# Patient Record
Sex: Female | Born: 1975 | Race: White | Hispanic: No | Smoking: Current every day smoker
Health system: Southern US, Community
[De-identification: ages and names within clinical notes are randomized; demographics above are authoritative.]

## PROBLEM LIST (undated history)

## (undated) DIAGNOSIS — G4730 Sleep apnea, unspecified: Secondary | ICD-10-CM

## (undated) DIAGNOSIS — I499 Cardiac arrhythmia, unspecified: Secondary | ICD-10-CM

## (undated) DIAGNOSIS — G629 Polyneuropathy, unspecified: Secondary | ICD-10-CM

## (undated) DIAGNOSIS — J189 Pneumonia, unspecified organism: Secondary | ICD-10-CM

## (undated) DIAGNOSIS — F319 Bipolar disorder, unspecified: Secondary | ICD-10-CM

## (undated) DIAGNOSIS — E7800 Pure hypercholesterolemia, unspecified: Secondary | ICD-10-CM

## (undated) DIAGNOSIS — F32A Depression, unspecified: Secondary | ICD-10-CM

## (undated) DIAGNOSIS — F329 Major depressive disorder, single episode, unspecified: Secondary | ICD-10-CM

## (undated) DIAGNOSIS — E669 Obesity, unspecified: Secondary | ICD-10-CM

## (undated) DIAGNOSIS — K219 Gastro-esophageal reflux disease without esophagitis: Secondary | ICD-10-CM

## (undated) HISTORY — PX: CARDIAC SURGERY: SHX584

## (undated) HISTORY — PX: APPENDECTOMY: SHX54

## (undated) HISTORY — PX: EYE SURGERY: SHX253

## (undated) HISTORY — PX: TUBAL LIGATION: SHX77

---

## 2003-08-07 ENCOUNTER — Other Ambulatory Visit: Admission: RE | Admit: 2003-08-07 | Discharge: 2003-08-07 | Payer: Self-pay | Admitting: Obstetrics and Gynecology

## 2004-01-30 ENCOUNTER — Inpatient Hospital Stay (HOSPITAL_COMMUNITY): Admission: AD | Admit: 2004-01-30 | Discharge: 2004-01-30 | Payer: Self-pay | Admitting: Obstetrics and Gynecology

## 2007-12-24 ENCOUNTER — Emergency Department (HOSPITAL_COMMUNITY): Admission: EM | Admit: 2007-12-24 | Discharge: 2007-12-25 | Payer: Self-pay | Admitting: Emergency Medicine

## 2008-01-08 ENCOUNTER — Emergency Department (HOSPITAL_COMMUNITY): Admission: EM | Admit: 2008-01-08 | Discharge: 2008-01-09 | Payer: Self-pay | Admitting: Emergency Medicine

## 2008-04-19 ENCOUNTER — Emergency Department (HOSPITAL_COMMUNITY): Admission: EM | Admit: 2008-04-19 | Discharge: 2008-04-20 | Payer: Self-pay | Admitting: Emergency Medicine

## 2008-08-28 ENCOUNTER — Emergency Department (HOSPITAL_COMMUNITY): Admission: EM | Admit: 2008-08-28 | Discharge: 2008-08-29 | Payer: Self-pay | Admitting: Emergency Medicine

## 2008-11-06 ENCOUNTER — Emergency Department (HOSPITAL_COMMUNITY): Admission: EM | Admit: 2008-11-06 | Discharge: 2008-11-06 | Payer: Self-pay | Admitting: Emergency Medicine

## 2008-11-18 ENCOUNTER — Emergency Department (HOSPITAL_COMMUNITY): Admission: EM | Admit: 2008-11-18 | Discharge: 2008-11-19 | Payer: Self-pay | Admitting: Emergency Medicine

## 2008-12-15 ENCOUNTER — Emergency Department (HOSPITAL_COMMUNITY): Admission: EM | Admit: 2008-12-15 | Discharge: 2008-12-16 | Payer: Self-pay | Admitting: Emergency Medicine

## 2008-12-21 ENCOUNTER — Emergency Department (HOSPITAL_COMMUNITY): Admission: EM | Admit: 2008-12-21 | Discharge: 2008-12-21 | Payer: Self-pay | Admitting: Emergency Medicine

## 2008-12-29 ENCOUNTER — Emergency Department (HOSPITAL_COMMUNITY): Admission: EM | Admit: 2008-12-29 | Discharge: 2008-12-29 | Payer: Self-pay | Admitting: Emergency Medicine

## 2009-01-03 ENCOUNTER — Emergency Department (HOSPITAL_COMMUNITY): Admission: EM | Admit: 2009-01-03 | Discharge: 2009-01-04 | Payer: Self-pay | Admitting: Emergency Medicine

## 2009-01-06 ENCOUNTER — Emergency Department (HOSPITAL_COMMUNITY): Admission: EM | Admit: 2009-01-06 | Discharge: 2009-01-07 | Payer: Self-pay | Admitting: Emergency Medicine

## 2009-01-07 ENCOUNTER — Emergency Department (HOSPITAL_COMMUNITY): Admission: EM | Admit: 2009-01-07 | Discharge: 2009-01-07 | Payer: Self-pay | Admitting: Emergency Medicine

## 2009-01-14 ENCOUNTER — Emergency Department (HOSPITAL_COMMUNITY): Admission: EM | Admit: 2009-01-14 | Discharge: 2009-01-14 | Payer: Self-pay | Admitting: Emergency Medicine

## 2009-02-08 ENCOUNTER — Emergency Department (HOSPITAL_COMMUNITY): Admission: EM | Admit: 2009-02-08 | Discharge: 2009-02-08 | Payer: Self-pay | Admitting: Emergency Medicine

## 2009-02-10 ENCOUNTER — Emergency Department (HOSPITAL_COMMUNITY): Admission: EM | Admit: 2009-02-10 | Discharge: 2009-02-10 | Payer: Self-pay | Admitting: Emergency Medicine

## 2009-02-14 ENCOUNTER — Emergency Department (HOSPITAL_COMMUNITY): Admission: EM | Admit: 2009-02-14 | Discharge: 2009-02-14 | Payer: Self-pay | Admitting: Emergency Medicine

## 2009-02-21 ENCOUNTER — Emergency Department (HOSPITAL_COMMUNITY): Admission: EM | Admit: 2009-02-21 | Discharge: 2009-02-21 | Payer: Self-pay | Admitting: Emergency Medicine

## 2009-02-23 ENCOUNTER — Emergency Department (HOSPITAL_COMMUNITY): Admission: EM | Admit: 2009-02-23 | Discharge: 2009-02-24 | Payer: Self-pay | Admitting: Emergency Medicine

## 2009-02-27 ENCOUNTER — Emergency Department (HOSPITAL_COMMUNITY): Admission: EM | Admit: 2009-02-27 | Discharge: 2009-02-28 | Payer: Self-pay | Admitting: Emergency Medicine

## 2009-03-02 ENCOUNTER — Emergency Department (HOSPITAL_COMMUNITY): Admission: EM | Admit: 2009-03-02 | Discharge: 2009-03-03 | Payer: Self-pay | Admitting: Emergency Medicine

## 2009-03-09 ENCOUNTER — Emergency Department (HOSPITAL_COMMUNITY): Admission: EM | Admit: 2009-03-09 | Discharge: 2009-03-10 | Payer: Self-pay | Admitting: Emergency Medicine

## 2009-03-20 ENCOUNTER — Emergency Department (HOSPITAL_COMMUNITY): Admission: EM | Admit: 2009-03-20 | Discharge: 2009-03-21 | Payer: Self-pay | Admitting: Emergency Medicine

## 2009-03-25 ENCOUNTER — Emergency Department (HOSPITAL_COMMUNITY): Admission: EM | Admit: 2009-03-25 | Discharge: 2009-03-26 | Payer: Self-pay | Admitting: Emergency Medicine

## 2009-03-31 ENCOUNTER — Emergency Department (HOSPITAL_COMMUNITY): Admission: EM | Admit: 2009-03-31 | Discharge: 2009-04-01 | Payer: Self-pay | Admitting: Emergency Medicine

## 2009-04-22 ENCOUNTER — Emergency Department (HOSPITAL_COMMUNITY): Admission: EM | Admit: 2009-04-22 | Discharge: 2009-04-23 | Payer: Self-pay | Admitting: Emergency Medicine

## 2009-05-17 ENCOUNTER — Emergency Department (HOSPITAL_COMMUNITY): Admission: EM | Admit: 2009-05-17 | Discharge: 2009-05-18 | Payer: Self-pay | Admitting: Emergency Medicine

## 2009-05-26 ENCOUNTER — Emergency Department (HOSPITAL_COMMUNITY): Admission: EM | Admit: 2009-05-26 | Discharge: 2009-05-27 | Payer: Self-pay | Admitting: Emergency Medicine

## 2009-05-30 ENCOUNTER — Emergency Department (HOSPITAL_COMMUNITY): Admission: EM | Admit: 2009-05-30 | Discharge: 2009-05-30 | Payer: Self-pay | Admitting: Emergency Medicine

## 2009-06-19 ENCOUNTER — Emergency Department (HOSPITAL_COMMUNITY): Admission: EM | Admit: 2009-06-19 | Discharge: 2009-06-20 | Payer: Self-pay | Admitting: Emergency Medicine

## 2009-06-27 ENCOUNTER — Emergency Department (HOSPITAL_COMMUNITY): Admission: EM | Admit: 2009-06-27 | Discharge: 2009-06-27 | Payer: Self-pay | Admitting: Emergency Medicine

## 2009-07-01 ENCOUNTER — Emergency Department (HOSPITAL_COMMUNITY): Admission: EM | Admit: 2009-07-01 | Discharge: 2009-07-01 | Payer: Self-pay | Admitting: Emergency Medicine

## 2009-07-03 ENCOUNTER — Emergency Department (HOSPITAL_COMMUNITY): Admission: EM | Admit: 2009-07-03 | Discharge: 2009-07-04 | Payer: Self-pay | Admitting: Emergency Medicine

## 2009-07-07 ENCOUNTER — Emergency Department (HOSPITAL_COMMUNITY): Admission: EM | Admit: 2009-07-07 | Discharge: 2009-07-07 | Payer: Self-pay | Admitting: Emergency Medicine

## 2009-08-04 ENCOUNTER — Emergency Department (HOSPITAL_COMMUNITY): Admission: EM | Admit: 2009-08-04 | Discharge: 2009-08-05 | Payer: Self-pay | Admitting: Emergency Medicine

## 2009-08-12 ENCOUNTER — Emergency Department (HOSPITAL_COMMUNITY): Admission: EM | Admit: 2009-08-12 | Discharge: 2009-08-13 | Payer: Self-pay | Admitting: Emergency Medicine

## 2009-09-29 ENCOUNTER — Emergency Department (HOSPITAL_COMMUNITY): Admission: EM | Admit: 2009-09-29 | Discharge: 2009-09-30 | Payer: Self-pay | Admitting: Emergency Medicine

## 2009-10-13 ENCOUNTER — Emergency Department (HOSPITAL_COMMUNITY): Admission: EM | Admit: 2009-10-13 | Discharge: 2009-10-14 | Payer: Self-pay | Admitting: Emergency Medicine

## 2009-10-15 ENCOUNTER — Emergency Department (HOSPITAL_COMMUNITY): Admission: EM | Admit: 2009-10-15 | Discharge: 2009-10-15 | Payer: Self-pay | Admitting: Emergency Medicine

## 2009-10-20 ENCOUNTER — Emergency Department (HOSPITAL_COMMUNITY): Admission: EM | Admit: 2009-10-20 | Discharge: 2009-10-21 | Payer: Self-pay | Admitting: Emergency Medicine

## 2009-11-09 ENCOUNTER — Emergency Department (HOSPITAL_COMMUNITY): Admission: EM | Admit: 2009-11-09 | Discharge: 2009-11-09 | Payer: Self-pay | Admitting: Emergency Medicine

## 2009-11-18 ENCOUNTER — Emergency Department (HOSPITAL_COMMUNITY): Admission: EM | Admit: 2009-11-18 | Discharge: 2009-11-19 | Payer: Self-pay | Admitting: Emergency Medicine

## 2010-01-27 ENCOUNTER — Emergency Department (HOSPITAL_COMMUNITY): Admission: EM | Admit: 2010-01-27 | Discharge: 2010-01-28 | Payer: Self-pay | Admitting: Emergency Medicine

## 2010-02-06 ENCOUNTER — Emergency Department (HOSPITAL_COMMUNITY): Admission: EM | Admit: 2010-02-06 | Discharge: 2010-02-06 | Payer: Self-pay | Admitting: Emergency Medicine

## 2010-04-22 ENCOUNTER — Emergency Department (HOSPITAL_COMMUNITY): Admission: EM | Admit: 2010-04-22 | Discharge: 2010-04-23 | Payer: Self-pay | Admitting: Emergency Medicine

## 2010-09-30 ENCOUNTER — Emergency Department (HOSPITAL_COMMUNITY)
Admission: EM | Admit: 2010-09-30 | Discharge: 2010-10-01 | Disposition: A | Discharge status: Home / Self Care | Payer: Medicaid Other | Attending: Emergency Medicine | Admitting: Emergency Medicine

## 2010-09-30 DIAGNOSIS — R112 Nausea with vomiting, unspecified: Secondary | ICD-10-CM | POA: Insufficient documentation

## 2010-09-30 DIAGNOSIS — R42 Dizziness and giddiness: Secondary | ICD-10-CM | POA: Insufficient documentation

## 2010-09-30 DIAGNOSIS — R10819 Abdominal tenderness, unspecified site: Secondary | ICD-10-CM | POA: Insufficient documentation

## 2010-09-30 DIAGNOSIS — J45909 Unspecified asthma, uncomplicated: Secondary | ICD-10-CM | POA: Insufficient documentation

## 2010-09-30 DIAGNOSIS — E669 Obesity, unspecified: Secondary | ICD-10-CM | POA: Insufficient documentation

## 2010-09-30 DIAGNOSIS — R109 Unspecified abdominal pain: Secondary | ICD-10-CM | POA: Insufficient documentation

## 2010-09-30 DIAGNOSIS — F3130 Bipolar disorder, current episode depressed, mild or moderate severity, unspecified: Secondary | ICD-10-CM | POA: Insufficient documentation

## 2010-09-30 DIAGNOSIS — Z9889 Other specified postprocedural states: Secondary | ICD-10-CM | POA: Insufficient documentation

## 2010-09-30 DIAGNOSIS — K219 Gastro-esophageal reflux disease without esophagitis: Secondary | ICD-10-CM | POA: Insufficient documentation

## 2010-09-30 LAB — COMPREHENSIVE METABOLIC PANEL
ALT: 20 U/L (ref 0–35)
AST: 17 U/L (ref 0–37)
CO2: 27 mEq/L (ref 19–32)
Chloride: 108 mEq/L (ref 96–112)
GFR calc Af Amer: >60 mL/min (ref >60)
GFR calc non Af Amer: 56 mL/min — ABNORMAL LOW (ref >60)
Potassium: 3.9 mEq/L (ref 3.5–5.1)
Sodium: 143 mEq/L (ref 135–145)
Total Bilirubin: 0.3 mg/dL (ref 0.3–1.2)

## 2010-09-30 LAB — DIFFERENTIAL
Basophils Absolute: 0.1 10*3/uL (ref 0.0–0.1)
Basophils Relative: 1 % (ref 0–1)
Monocytes Relative: 5 % (ref 3–12)
Neutro Abs: 5.2 10*3/uL (ref 1.7–7.7)
Neutrophils Relative %: 50 % (ref 43–77)

## 2010-09-30 LAB — CBC
Hemoglobin: 13.6 g/dL (ref 12.0–15.0)
MCH: 30.6 pg (ref 26.0–34.0)
RBC: 4.44 MIL/uL (ref 3.87–5.11)
WBC: 10.4 10*3/uL (ref 4.0–10.5)

## 2010-10-01 LAB — URINALYSIS, ROUTINE W REFLEX MICROSCOPIC
Hgb urine dipstick: NEGATIVE
Ketones, ur: NEGATIVE mg/dL
Protein, ur: NEGATIVE mg/dL
Urine Glucose, Fasting: NEGATIVE mg/dL
pH: 6 (ref 5.0–8.0)

## 2010-10-06 ENCOUNTER — Emergency Department (HOSPITAL_COMMUNITY): Payer: Medicaid Other

## 2010-10-06 ENCOUNTER — Emergency Department (HOSPITAL_COMMUNITY)
Admission: EM | Admit: 2010-10-06 | Discharge: 2010-10-07 | Discharge status: Against Medical Advice | Payer: Medicaid Other | Attending: Emergency Medicine | Admitting: Emergency Medicine

## 2010-10-06 DIAGNOSIS — Z79899 Other long term (current) drug therapy: Secondary | ICD-10-CM | POA: Insufficient documentation

## 2010-10-06 DIAGNOSIS — R630 Anorexia: Secondary | ICD-10-CM | POA: Insufficient documentation

## 2010-10-06 DIAGNOSIS — R112 Nausea with vomiting, unspecified: Secondary | ICD-10-CM | POA: Insufficient documentation

## 2010-10-06 DIAGNOSIS — J45909 Unspecified asthma, uncomplicated: Secondary | ICD-10-CM | POA: Insufficient documentation

## 2010-10-06 DIAGNOSIS — K219 Gastro-esophageal reflux disease without esophagitis: Secondary | ICD-10-CM | POA: Insufficient documentation

## 2010-10-06 DIAGNOSIS — F3130 Bipolar disorder, current episode depressed, mild or moderate severity, unspecified: Secondary | ICD-10-CM | POA: Insufficient documentation

## 2010-10-06 DIAGNOSIS — R1011 Right upper quadrant pain: Secondary | ICD-10-CM | POA: Insufficient documentation

## 2010-10-06 LAB — URINALYSIS, ROUTINE W REFLEX MICROSCOPIC
Bilirubin Urine: NEGATIVE
Nitrite: NEGATIVE
Specific Gravity, Urine: 1.023 (ref 1.005–1.030)
pH: 6 (ref 5.0–8.0)

## 2010-10-06 LAB — URINE MICROSCOPIC-ADD ON

## 2010-10-06 LAB — DIFFERENTIAL
Basophils Absolute: 0 10*3/uL (ref 0.0–0.1)
Basophils Relative: 0 % (ref 0–1)
Eosinophils Absolute: 0.7 10*3/uL (ref 0.0–0.7)
Monocytes Absolute: 0.7 10*3/uL (ref 0.1–1.0)
Monocytes Relative: 5 % (ref 3–12)
Neutro Abs: 7.7 10*3/uL (ref 1.7–7.7)

## 2010-10-06 LAB — POCT PREGNANCY, URINE: Preg Test, Ur: NEGATIVE

## 2010-10-06 LAB — CBC
Hemoglobin: 12.5 g/dL (ref 12.0–15.0)
MCH: 29.9 pg (ref 26.0–34.0)
MCHC: 34.2 g/dL (ref 30.0–36.0)
Platelets: 307 10*3/uL (ref 150–400)
RDW: 14.8 % (ref 11.5–15.5)

## 2010-10-07 LAB — COMPREHENSIVE METABOLIC PANEL
ALT: 14 U/L (ref 0–35)
AST: 12 U/L (ref 0–37)
Albumin: 3.6 g/dL (ref 3.5–5.2)
CO2: 28 mEq/L (ref 19–32)
Calcium: 9 mg/dL (ref 8.4–10.5)
Creatinine, Ser: 1.2 mg/dL (ref 0.4–1.2)
GFR calc Af Amer: >60 mL/min (ref >60)
GFR calc non Af Amer: 52 mL/min — ABNORMAL LOW (ref >60)
Sodium: 138 mEq/L (ref 135–145)
Total Protein: 7.1 g/dL (ref 6.0–8.3)

## 2010-10-11 ENCOUNTER — Emergency Department (HOSPITAL_COMMUNITY)
Admission: EM | Admit: 2010-10-11 | Discharge: 2010-10-12 | Disposition: A | Discharge status: Home / Self Care | Payer: Self-pay | Attending: Emergency Medicine | Admitting: Emergency Medicine

## 2010-10-11 ENCOUNTER — Emergency Department: Payer: Self-pay | Admitting: Emergency Medicine

## 2010-10-21 LAB — URINE MICROSCOPIC-ADD ON

## 2010-10-21 LAB — COMPREHENSIVE METABOLIC PANEL
ALT: 16 U/L (ref 0–35)
AST: 12 U/L (ref 0–37)
Calcium: 8.8 mg/dL (ref 8.4–10.5)
Creatinine, Ser: 0.97 mg/dL (ref 0.4–1.2)
GFR calc Af Amer: >60 mL/min (ref >60)
Sodium: 140 mEq/L (ref 135–145)
Total Protein: 7 g/dL (ref 6.0–8.3)

## 2010-10-21 LAB — RAPID URINE DRUG SCREEN, HOSP PERFORMED
Amphetamines: NOT DETECTED
Barbiturates: NOT DETECTED
Benzodiazepines: NOT DETECTED

## 2010-10-21 LAB — URINALYSIS, ROUTINE W REFLEX MICROSCOPIC
Bilirubin Urine: NEGATIVE
Glucose, UA: NEGATIVE mg/dL
Specific Gravity, Urine: 1.021 (ref 1.005–1.030)
Urobilinogen, UA: 0.2 mg/dL (ref 0.0–1.0)
pH: 6 (ref 5.0–8.0)

## 2010-10-21 LAB — DIFFERENTIAL
Eosinophils Absolute: 0.6 10*3/uL (ref 0.0–0.7)
Eosinophils Relative: 4 % (ref 0–5)
Lymphocytes Relative: 24 % (ref 12–46)
Lymphs Abs: 3.6 10*3/uL (ref 0.7–4.0)
Monocytes Relative: 7 % (ref 3–12)

## 2010-10-21 LAB — POCT PREGNANCY, URINE: Preg Test, Ur: NEGATIVE

## 2010-10-21 LAB — CBC
Hemoglobin: 12.1 g/dL (ref 12.0–15.0)
MCHC: 33.2 g/dL (ref 30.0–36.0)
RDW: 15.1 % (ref 11.5–15.5)
WBC: 15.2 10*3/uL — ABNORMAL HIGH (ref 4.0–10.5)

## 2010-10-21 LAB — ETHANOL: Alcohol, Ethyl (B): <5 mg/dL (ref 0–10)

## 2010-10-22 IMAGING — CR DG CHEST 2V
2 series · 2 of 2 positions shown · non-contrast
Comparison: 01/09/2008

CLINICAL DATA: Fever, vomiting

CHEST - 2 VIEW

[w chest pa]
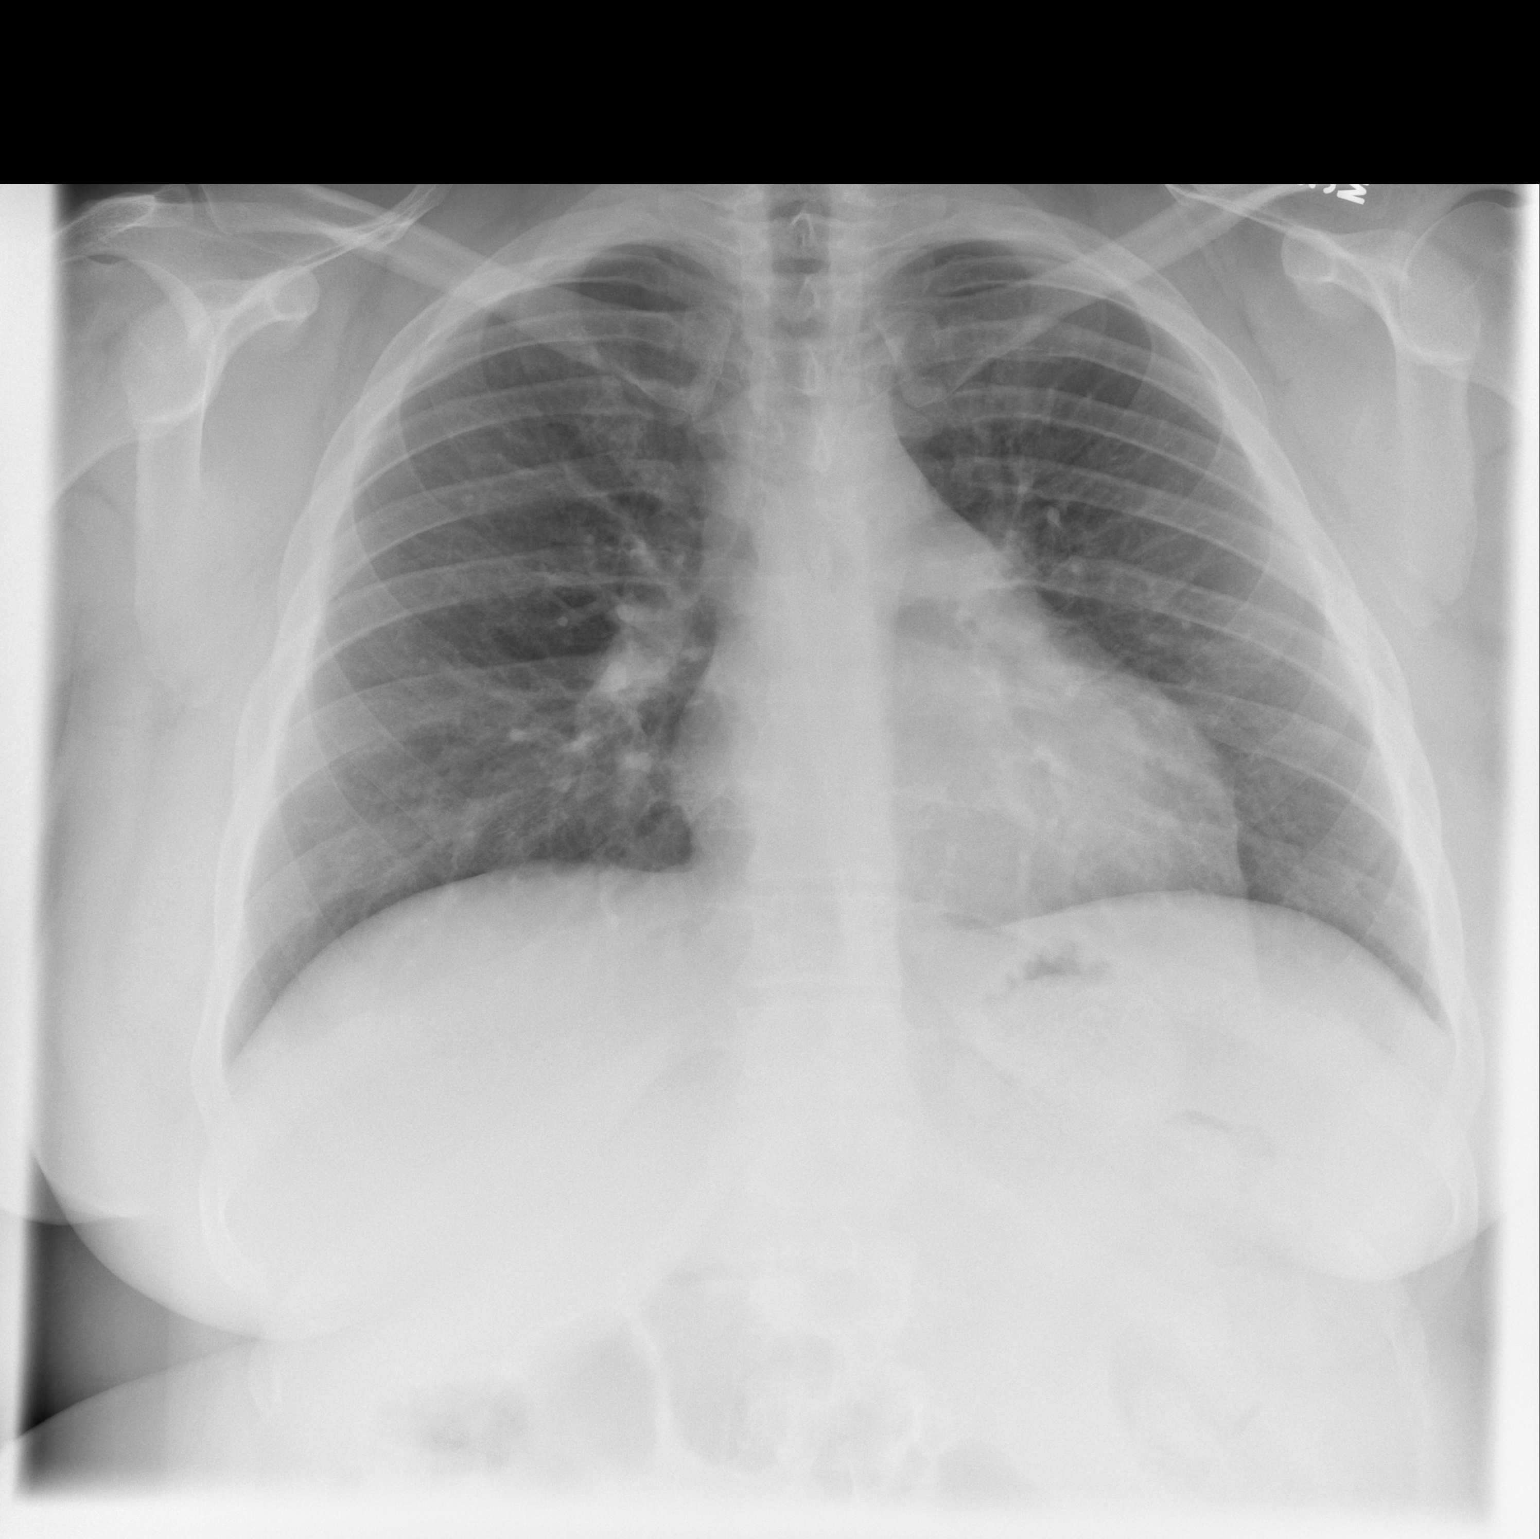

[w chest lat]
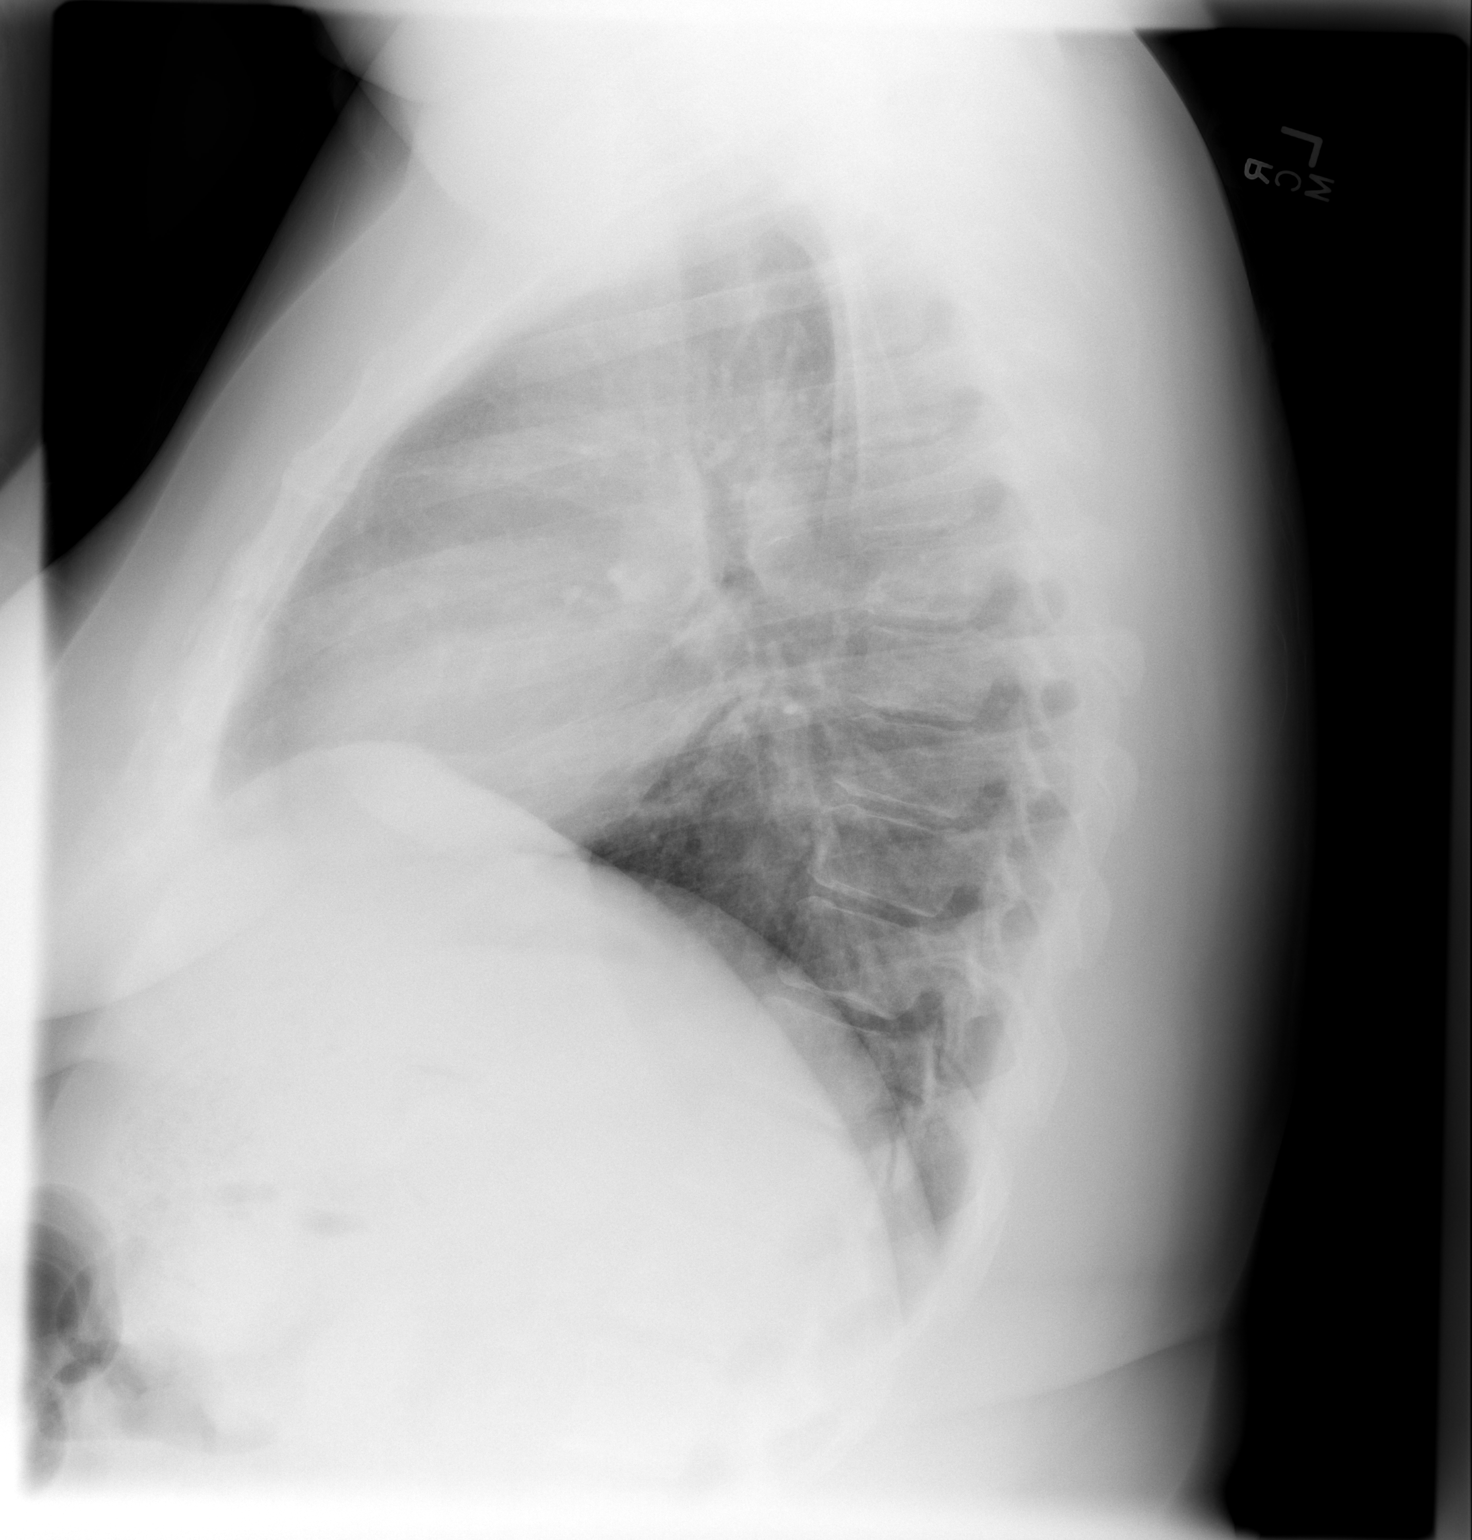

[2 of 2 positions shown; findings below may reference images not displayed]

FINDINGS: The heart size and mediastinal contours are within
normal limits.  Both lungs are clear.  The visualized skeletal
structures are unremarkable.
IMPRESSION: No active cardiopulmonary disease.

## 2010-10-24 LAB — URINE MICROSCOPIC-ADD ON

## 2010-10-24 LAB — CBC
HCT: 34.3 % — ABNORMAL LOW (ref 36.0–46.0)
Hemoglobin: 13 g/dL (ref 12.0–15.0)
Hemoglobin: 11.5 g/dL — ABNORMAL LOW (ref 12.0–15.0)
MCH: 30.7 pg (ref 26.0–34.0)
MCH: 30.2 pg (ref 26.0–34.0)
MCHC: 33.4 g/dL (ref 30.0–36.0)
MCHC: 33.4 g/dL (ref 30.0–36.0)
MCV: 88.7 fL (ref 78.0–100.0)
MCV: 90.4 fL (ref 78.0–100.0)
Platelets: 334 10*3/uL (ref 150–400)
Platelets: 373 10*3/uL (ref 150–400)
Platelets: 366 10*3/uL (ref 150–400)
RDW: 15.8 % — ABNORMAL HIGH (ref 11.5–15.5)
RDW: 16.9 % — ABNORMAL HIGH (ref 11.5–15.5)

## 2010-10-24 LAB — COMPREHENSIVE METABOLIC PANEL
ALT: 16 U/L (ref 0–35)
AST: 16 U/L (ref 0–37)
Albumin: 3.6 g/dL (ref 3.5–5.2)
Albumin: 3.7 g/dL (ref 3.5–5.2)
BUN: 9 mg/dL (ref 6–23)
CO2: 27 mEq/L (ref 19–32)
Calcium: 8.8 mg/dL (ref 8.4–10.5)
Chloride: 107 mEq/L (ref 96–112)
Creatinine, Ser: 1.42 mg/dL — ABNORMAL HIGH (ref 0.4–1.2)
Creatinine, Ser: 1.11 mg/dL (ref 0.4–1.2)
GFR calc Af Amer: 52 mL/min — ABNORMAL LOW (ref >60)
Sodium: 141 mEq/L (ref 135–145)
Total Bilirubin: 0.5 mg/dL (ref 0.3–1.2)
Total Protein: 7.4 g/dL (ref 6.0–8.3)
Total Protein: 7 g/dL (ref 6.0–8.3)

## 2010-10-24 LAB — DIFFERENTIAL
Basophils Absolute: 0.2 10*3/uL — ABNORMAL HIGH (ref 0.0–0.1)
Basophils Absolute: 0.2 10*3/uL — ABNORMAL HIGH (ref 0.0–0.1)
Basophils Relative: 2 % — ABNORMAL HIGH (ref 0–1)
Eosinophils Absolute: 0.5 10*3/uL (ref 0.0–0.7)
Eosinophils Absolute: 0.4 10*3/uL (ref 0.0–0.7)
Eosinophils Relative: 4 % (ref 0–5)
Eosinophils Relative: 3 % (ref 0–5)
Lymphocytes Relative: 25 % (ref 12–46)
Lymphocytes Relative: 26 % (ref 12–46)
Lymphs Abs: 3.6 10*3/uL (ref 0.7–4.0)
Monocytes Absolute: 0.8 10*3/uL (ref 0.1–1.0)
Monocytes Absolute: 0.7 10*3/uL (ref 0.1–1.0)
Monocytes Relative: 5 % (ref 3–12)
Monocytes Relative: 5 % (ref 3–12)
Neutro Abs: 8.8 10*3/uL — ABNORMAL HIGH (ref 1.7–7.7)
Neutrophils Relative %: 66 % (ref 43–77)

## 2010-10-24 LAB — URINALYSIS, ROUTINE W REFLEX MICROSCOPIC
Bilirubin Urine: NEGATIVE
Glucose, UA: NEGATIVE mg/dL
Glucose, UA: NEGATIVE mg/dL
Ketones, ur: NEGATIVE mg/dL
Ketones, ur: NEGATIVE mg/dL
Leukocytes, UA: NEGATIVE
Leukocytes, UA: NEGATIVE
Nitrite: POSITIVE — AB
Nitrite: NEGATIVE
Nitrite: NEGATIVE
Protein, ur: NEGATIVE mg/dL
Specific Gravity, Urine: 1.04 — ABNORMAL HIGH (ref 1.005–1.030)
Specific Gravity, Urine: 1.025 (ref 1.005–1.030)
pH: 6 (ref 5.0–8.0)
pH: 6 (ref 5.0–8.0)
pH: 5.5 (ref 5.0–8.0)

## 2010-10-24 LAB — POCT I-STAT, CHEM 8
BUN: 12 mg/dL (ref 6–23)
Chloride: 106 mEq/L (ref 96–112)
Creatinine, Ser: 0.8 mg/dL (ref 0.4–1.2)
Creatinine, Ser: 1.2 mg/dL (ref 0.4–1.2)
HCT: 40 % (ref 36.0–46.0)
Hemoglobin: 13.6 g/dL (ref 12.0–15.0)
Potassium: 3.9 mEq/L (ref 3.5–5.1)
Sodium: 140 mEq/L (ref 135–145)
Sodium: 140 mEq/L (ref 135–145)

## 2010-10-24 LAB — POCT PREGNANCY, URINE
Preg Test, Ur: NEGATIVE
Preg Test, Ur: NEGATIVE

## 2010-10-24 LAB — LIPASE, BLOOD: Lipase: 25 U/L (ref 11–59)

## 2010-10-27 LAB — COMPREHENSIVE METABOLIC PANEL
Alkaline Phosphatase: 89 U/L (ref 39–117)
BUN: 11 mg/dL (ref 6–23)
BUN: 11 mg/dL (ref 6–23)
CO2: 24 mEq/L (ref 19–32)
Calcium: 8.9 mg/dL (ref 8.4–10.5)
Calcium: 8.9 mg/dL (ref 8.4–10.5)
Creatinine, Ser: 1.25 mg/dL — ABNORMAL HIGH (ref 0.4–1.2)
GFR calc non Af Amer: 50 mL/min — ABNORMAL LOW (ref >60)
Glucose, Bld: 77 mg/dL (ref 70–99)
Glucose, Bld: 93 mg/dL (ref 70–99)
Potassium: 4 mEq/L (ref 3.5–5.1)
Total Bilirubin: 0.7 mg/dL (ref 0.3–1.2)
Total Protein: 7.5 g/dL (ref 6.0–8.3)

## 2010-10-27 LAB — URINALYSIS, ROUTINE W REFLEX MICROSCOPIC
Bilirubin Urine: NEGATIVE
Ketones, ur: NEGATIVE mg/dL
Ketones, ur: NEGATIVE mg/dL
Leukocytes, UA: NEGATIVE
Leukocytes, UA: NEGATIVE
Nitrite: NEGATIVE
Nitrite: NEGATIVE
Nitrite: NEGATIVE
Protein, ur: NEGATIVE mg/dL
Specific Gravity, Urine: 1.023 (ref 1.005–1.030)
Specific Gravity, Urine: 1.017 (ref 1.005–1.030)
Urobilinogen, UA: 1 mg/dL (ref 0.0–1.0)
Urobilinogen, UA: 1 mg/dL (ref 0.0–1.0)
Urobilinogen, UA: 1 mg/dL (ref 0.0–1.0)
pH: 6 (ref 5.0–8.0)
pH: 7 (ref 5.0–8.0)
pH: 6.5 (ref 5.0–8.0)

## 2010-10-27 LAB — CBC
HCT: 34.3 % — ABNORMAL LOW (ref 36.0–46.0)
HCT: 35.1 % — ABNORMAL LOW (ref 36.0–46.0)
Hemoglobin: 11.8 g/dL — ABNORMAL LOW (ref 12.0–15.0)
Hemoglobin: 11.9 g/dL — ABNORMAL LOW (ref 12.0–15.0)
Hemoglobin: 11.5 g/dL — ABNORMAL LOW (ref 12.0–15.0)
MCHC: 33.8 g/dL (ref 30.0–36.0)
MCHC: 34.4 g/dL (ref 30.0–36.0)
MCHC: 33.6 g/dL (ref 30.0–36.0)
MCV: 88.6 fL (ref 78.0–100.0)
MCV: 88.9 fL (ref 78.0–100.0)
Platelets: 314 K/uL (ref 150–400)
Platelets: 351 10*3/uL (ref 150–400)
RBC: 3.87 MIL/uL (ref 3.87–5.11)
RBC: 3.95 MIL/uL (ref 3.87–5.11)
RDW: 16.1 % — ABNORMAL HIGH (ref 11.5–15.5)
RDW: 16.1 % — ABNORMAL HIGH (ref 11.5–15.5)
WBC: 10.2 K/uL (ref 4.0–10.5)

## 2010-10-27 LAB — URINE MICROSCOPIC-ADD ON

## 2010-10-27 LAB — DIFFERENTIAL
Basophils Absolute: 0.1 10*3/uL (ref 0.0–0.1)
Basophils Absolute: 0.1 10*3/uL (ref 0.0–0.1)
Basophils Relative: 2 % — ABNORMAL HIGH (ref 0–1)
Basophils Relative: 1 % (ref 0–1)
Lymphocytes Relative: 36 % (ref 12–46)
Lymphocytes Relative: 20 % (ref 12–46)
Lymphs Abs: 3.6 10*3/uL (ref 0.7–4.0)
Monocytes Absolute: 0.7 10*3/uL (ref 0.1–1.0)
Monocytes Relative: 5 % (ref 3–12)
Neutro Abs: 6.7 10*3/uL (ref 1.7–7.7)
Neutro Abs: 12.3 10*3/uL — ABNORMAL HIGH (ref 1.7–7.7)
Neutrophils Relative %: 52 % (ref 43–77)
Neutrophils Relative %: 66 % (ref 43–77)
Neutrophils Relative %: 72 % (ref 43–77)

## 2010-10-27 LAB — LIPASE, BLOOD: Lipase: 23 U/L (ref 11–59)

## 2010-10-27 LAB — BASIC METABOLIC PANEL
CO2: 27 mEq/L (ref 19–32)
Calcium: 8.3 mg/dL — ABNORMAL LOW (ref 8.4–10.5)
Creatinine, Ser: 1.07 mg/dL (ref 0.4–1.2)
GFR calc non Af Amer: 60 mL/min — ABNORMAL LOW (ref >60)
Glucose, Bld: 79 mg/dL (ref 70–99)
Sodium: 134 mEq/L — ABNORMAL LOW (ref 135–145)

## 2010-10-27 LAB — PREGNANCY, URINE: Preg Test, Ur: NEGATIVE

## 2010-10-31 LAB — DIFFERENTIAL
Basophils Absolute: 0.1 10*3/uL (ref 0.0–0.1)
Basophils Relative: 1 % (ref 0–1)
Lymphocytes Relative: 40 % (ref 12–46)
Neutro Abs: 4 10*3/uL (ref 1.7–7.7)
Neutrophils Relative %: 48 % (ref 43–77)

## 2010-10-31 LAB — URINALYSIS, ROUTINE W REFLEX MICROSCOPIC
Bilirubin Urine: NEGATIVE
Glucose, UA: NEGATIVE mg/dL
Hgb urine dipstick: NEGATIVE
Leukocytes, UA: NEGATIVE
Nitrite: NEGATIVE
Protein, ur: NEGATIVE mg/dL
Protein, ur: NEGATIVE mg/dL
Specific Gravity, Urine: 1.017 (ref 1.005–1.030)
Urobilinogen, UA: 0.2 mg/dL (ref 0.0–1.0)
Urobilinogen, UA: 0.2 mg/dL (ref 0.0–1.0)

## 2010-10-31 LAB — URINE MICROSCOPIC-ADD ON

## 2010-10-31 LAB — COMPREHENSIVE METABOLIC PANEL
Alkaline Phosphatase: 99 U/L (ref 39–117)
BUN: 9 mg/dL (ref 6–23)
Chloride: 103 mEq/L (ref 96–112)
Creatinine, Ser: 1.28 mg/dL — ABNORMAL HIGH (ref 0.4–1.2)
Glucose, Bld: 97 mg/dL (ref 70–99)
Potassium: 4.2 mEq/L (ref 3.5–5.1)
Total Bilirubin: 0.5 mg/dL (ref 0.3–1.2)

## 2010-10-31 LAB — CK TOTAL AND CKMB (NOT AT ARMC)
CK, MB: 1 ng/mL (ref 0.3–4.0)
Total CK: 113 U/L (ref 7–177)

## 2010-10-31 LAB — POCT CARDIAC MARKERS
CKMB, poc: <1 ng/mL — ABNORMAL LOW (ref 1.0–8.0)
Myoglobin, poc: 74.9 ng/mL (ref 12–200)

## 2010-10-31 LAB — CBC
HCT: 36.7 % (ref 36.0–46.0)
Hemoglobin: 12.4 g/dL (ref 12.0–15.0)
MCV: 88.8 fL (ref 78.0–100.0)
RDW: 15.7 % — ABNORMAL HIGH (ref 11.5–15.5)

## 2010-10-31 LAB — POCT PREGNANCY, URINE: Preg Test, Ur: NEGATIVE

## 2010-10-31 LAB — LIPASE, BLOOD: Lipase: 20 U/L (ref 11–59)

## 2010-11-01 ENCOUNTER — Emergency Department (HOSPITAL_COMMUNITY)
Admission: EM | Admit: 2010-11-01 | Discharge: 2010-11-01 | Disposition: A | Discharge status: Home / Self Care | Payer: Medicaid Other | Attending: Emergency Medicine | Admitting: Emergency Medicine

## 2010-11-01 DIAGNOSIS — G8929 Other chronic pain: Secondary | ICD-10-CM | POA: Insufficient documentation

## 2010-11-01 DIAGNOSIS — Z79899 Other long term (current) drug therapy: Secondary | ICD-10-CM | POA: Insufficient documentation

## 2010-11-01 DIAGNOSIS — J45909 Unspecified asthma, uncomplicated: Secondary | ICD-10-CM | POA: Insufficient documentation

## 2010-11-01 DIAGNOSIS — R630 Anorexia: Secondary | ICD-10-CM | POA: Insufficient documentation

## 2010-11-01 DIAGNOSIS — F3130 Bipolar disorder, current episode depressed, mild or moderate severity, unspecified: Secondary | ICD-10-CM | POA: Insufficient documentation

## 2010-11-01 DIAGNOSIS — R112 Nausea with vomiting, unspecified: Secondary | ICD-10-CM | POA: Insufficient documentation

## 2010-11-01 DIAGNOSIS — R1011 Right upper quadrant pain: Secondary | ICD-10-CM | POA: Insufficient documentation

## 2010-11-01 DIAGNOSIS — K219 Gastro-esophageal reflux disease without esophagitis: Secondary | ICD-10-CM | POA: Insufficient documentation

## 2010-11-01 LAB — DIFFERENTIAL
Basophils Relative: 1 % (ref 0–1)
Eosinophils Absolute: 0.5 10*3/uL (ref 0.0–0.7)
Lymphs Abs: 3.7 10*3/uL (ref 0.7–4.0)
Monocytes Absolute: 0.6 10*3/uL (ref 0.1–1.0)
Monocytes Relative: 5 % (ref 3–12)
Neutrophils Relative %: 59 % (ref 43–77)

## 2010-11-01 LAB — COMPREHENSIVE METABOLIC PANEL
Albumin: 3.6 g/dL (ref 3.5–5.2)
BUN: 10 mg/dL (ref 6–23)
CO2: 26 mEq/L (ref 19–32)
Calcium: 8.8 mg/dL (ref 8.4–10.5)
Chloride: 102 mEq/L (ref 96–112)
Creatinine, Ser: 1.07 mg/dL (ref 0.4–1.2)
GFR calc Af Amer: >60 mL/min (ref >60)
GFR calc non Af Amer: 59 mL/min — ABNORMAL LOW (ref >60)
Total Bilirubin: 0.4 mg/dL (ref 0.3–1.2)

## 2010-11-01 LAB — URINALYSIS, ROUTINE W REFLEX MICROSCOPIC
Bilirubin Urine: NEGATIVE
Glucose, UA: NEGATIVE mg/dL
Hgb urine dipstick: NEGATIVE
Ketones, ur: NEGATIVE mg/dL
Protein, ur: NEGATIVE mg/dL
Urobilinogen, UA: 0.2 mg/dL (ref 0.0–1.0)

## 2010-11-01 LAB — CBC
MCH: 29.6 pg (ref 26.0–34.0)
MCHC: 33.8 g/dL (ref 30.0–36.0)
MCV: 87.6 fL (ref 78.0–100.0)
Platelets: 331 10*3/uL (ref 150–400)
RBC: 4.19 MIL/uL (ref 3.87–5.11)

## 2010-11-08 LAB — DIFFERENTIAL
Basophils Relative: 1 % (ref 0–1)
Lymphocytes Relative: 32 % (ref 12–46)
Lymphs Abs: 4 10*3/uL (ref 0.7–4.0)
Monocytes Relative: 7 % (ref 3–12)
Neutro Abs: 7.2 10*3/uL (ref 1.7–7.7)
Neutrophils Relative %: 57 % (ref 43–77)

## 2010-11-08 LAB — CBC
HCT: 36.1 % (ref 36.0–46.0)
Hemoglobin: 12.2 g/dL (ref 12.0–15.0)
MCHC: 33.8 g/dL (ref 30.0–36.0)
MCV: 90.3 fL (ref 78.0–100.0)
RDW: 15.4 % (ref 11.5–15.5)

## 2010-11-08 LAB — COMPREHENSIVE METABOLIC PANEL
Alkaline Phosphatase: 89 U/L (ref 39–117)
BUN: 13 mg/dL (ref 6–23)
Calcium: 8.8 mg/dL (ref 8.4–10.5)
Creatinine, Ser: 1.08 mg/dL (ref 0.4–1.2)
Glucose, Bld: 78 mg/dL (ref 70–99)
Total Protein: 7 g/dL (ref 6.0–8.3)

## 2010-11-08 LAB — URINALYSIS, ROUTINE W REFLEX MICROSCOPIC
Leukocytes, UA: NEGATIVE
Protein, ur: NEGATIVE mg/dL
Specific Gravity, Urine: 1.031 — ABNORMAL HIGH (ref 1.005–1.030)
Urobilinogen, UA: 1 mg/dL (ref 0.0–1.0)

## 2010-11-08 LAB — URINE MICROSCOPIC-ADD ON

## 2010-11-10 LAB — URINALYSIS, ROUTINE W REFLEX MICROSCOPIC
Bilirubin Urine: NEGATIVE
Bilirubin Urine: NEGATIVE
Bilirubin Urine: NEGATIVE
Glucose, UA: NEGATIVE mg/dL
Glucose, UA: NEGATIVE mg/dL
Ketones, ur: NEGATIVE mg/dL
Ketones, ur: NEGATIVE mg/dL
Ketones, ur: NEGATIVE mg/dL
Leukocytes, UA: NEGATIVE
Leukocytes, UA: NEGATIVE
Nitrite: NEGATIVE
Nitrite: NEGATIVE
Nitrite: NEGATIVE
Protein, ur: NEGATIVE mg/dL
Protein, ur: NEGATIVE mg/dL
Specific Gravity, Urine: 1.011 (ref 1.005–1.030)
Specific Gravity, Urine: 1.022 (ref 1.005–1.030)
Urobilinogen, UA: 0.2 mg/dL (ref 0.0–1.0)
Urobilinogen, UA: 0.2 mg/dL (ref 0.0–1.0)
pH: 6 (ref 5.0–8.0)
pH: 6 (ref 5.0–8.0)

## 2010-11-10 LAB — DIFFERENTIAL
Basophils Absolute: 0.1 10*3/uL (ref 0.0–0.1)
Basophils Relative: 1 % (ref 0–1)
Basophils Relative: 1 % (ref 0–1)
Eosinophils Absolute: 0.5 10*3/uL (ref 0.0–0.7)
Eosinophils Relative: 0 % (ref 0–5)
Lymphocytes Relative: 14 % (ref 12–46)
Monocytes Absolute: 0.4 10*3/uL (ref 0.1–1.0)
Neutrophils Relative %: 56 % (ref 43–77)

## 2010-11-10 LAB — URINE MICROSCOPIC-ADD ON

## 2010-11-10 LAB — COMPREHENSIVE METABOLIC PANEL
ALT: 19 U/L (ref 0–35)
Alkaline Phosphatase: 85 U/L (ref 39–117)
CO2: 28 mEq/L (ref 19–32)
Calcium: 8.7 mg/dL (ref 8.4–10.5)
GFR calc non Af Amer: 54 mL/min — ABNORMAL LOW (ref >60)
Glucose, Bld: 93 mg/dL (ref 70–99)
Potassium: 3.6 mEq/L (ref 3.5–5.1)
Sodium: 138 mEq/L (ref 135–145)

## 2010-11-10 LAB — CBC
HCT: 29.3 % — ABNORMAL LOW (ref 36.0–46.0)
HCT: 36.4 % (ref 36.0–46.0)
Hemoglobin: 10 g/dL — ABNORMAL LOW (ref 12.0–15.0)
Hemoglobin: 12.5 g/dL (ref 12.0–15.0)
MCHC: 34.1 g/dL (ref 30.0–36.0)
MCHC: 34.3 g/dL (ref 30.0–36.0)
RBC: 4.02 MIL/uL (ref 3.87–5.11)
RDW: 15.5 % (ref 11.5–15.5)

## 2010-11-10 LAB — PREGNANCY, URINE: Preg Test, Ur: NEGATIVE

## 2010-11-10 LAB — POCT I-STAT, CHEM 8
BUN: 16 mg/dL (ref 6–23)
Calcium, Ion: 1.1 mmol/L — ABNORMAL LOW (ref 1.12–1.32)
Glucose, Bld: 128 mg/dL — ABNORMAL HIGH (ref 70–99)
TCO2: 22 mmol/L (ref 0–100)

## 2010-11-10 LAB — HEPATIC FUNCTION PANEL
Alkaline Phosphatase: 78 U/L (ref 39–117)
Bilirubin, Direct: <0.1 mg/dL (ref 0.0–0.3)
Total Protein: 7.2 g/dL (ref 6.0–8.3)

## 2010-11-10 LAB — LIPASE, BLOOD: Lipase: 22 U/L (ref 11–59)

## 2010-11-10 LAB — POCT PREGNANCY, URINE
Preg Test, Ur: NEGATIVE
Preg Test, Ur: NEGATIVE

## 2010-11-11 LAB — COMPREHENSIVE METABOLIC PANEL
ALT: 15 U/L (ref 0–35)
AST: 15 U/L (ref 0–37)
CO2: 27 mEq/L (ref 19–32)
Chloride: 105 mEq/L (ref 96–112)
GFR calc Af Amer: >60 mL/min (ref >60)
GFR calc non Af Amer: 58 mL/min — ABNORMAL LOW (ref >60)
Glucose, Bld: 90 mg/dL (ref 70–99)
Sodium: 137 mEq/L (ref 135–145)
Total Bilirubin: 0.3 mg/dL (ref 0.3–1.2)

## 2010-11-11 LAB — POCT I-STAT, CHEM 8
Chloride: 104 mEq/L (ref 96–112)
Glucose, Bld: 91 mg/dL (ref 70–99)
HCT: 39 % (ref 36.0–46.0)
Hemoglobin: 13.3 g/dL (ref 12.0–15.0)
Potassium: 3.7 mEq/L (ref 3.5–5.1)
Sodium: 139 mEq/L (ref 135–145)

## 2010-11-11 LAB — CBC
HCT: 36.7 % (ref 36.0–46.0)
Hemoglobin: 12.4 g/dL (ref 12.0–15.0)
MCV: 90.8 fL (ref 78.0–100.0)
Platelets: 295 10*3/uL (ref 150–400)
Platelets: 313 10*3/uL (ref 150–400)
RBC: 3.99 MIL/uL (ref 3.87–5.11)
RDW: 14.9 % (ref 11.5–15.5)
WBC: 12.3 10*3/uL — ABNORMAL HIGH (ref 4.0–10.5)
WBC: 14.3 10*3/uL — ABNORMAL HIGH (ref 4.0–10.5)

## 2010-11-11 LAB — URINALYSIS, ROUTINE W REFLEX MICROSCOPIC
Bilirubin Urine: NEGATIVE
Glucose, UA: NEGATIVE mg/dL
Ketones, ur: NEGATIVE mg/dL
Ketones, ur: NEGATIVE mg/dL
Leukocytes, UA: NEGATIVE
Leukocytes, UA: NEGATIVE
Nitrite: NEGATIVE
Specific Gravity, Urine: 1.014 (ref 1.005–1.030)
Specific Gravity, Urine: 1.016 (ref 1.005–1.030)
Urobilinogen, UA: 0.2 mg/dL (ref 0.0–1.0)
pH: 6 (ref 5.0–8.0)
pH: 5.5 (ref 5.0–8.0)

## 2010-11-11 LAB — POCT PREGNANCY, URINE: Preg Test, Ur: NEGATIVE

## 2010-11-11 LAB — DIFFERENTIAL
Basophils Absolute: 0 10*3/uL (ref 0.0–0.1)
Basophils Absolute: 0.2 10*3/uL — ABNORMAL HIGH (ref 0.0–0.1)
Basophils Relative: 0 % (ref 0–1)
Eosinophils Absolute: 0.4 10*3/uL (ref 0.0–0.7)
Eosinophils Relative: 3 % (ref 0–5)
Eosinophils Relative: 3 % (ref 0–5)
Lymphocytes Relative: 23 % (ref 12–46)
Lymphocytes Relative: 20 % (ref 12–46)
Lymphs Abs: 2.8 10*3/uL (ref 0.7–4.0)
Neutro Abs: 9.8 10*3/uL — ABNORMAL HIGH (ref 1.7–7.7)
Neutrophils Relative %: 68 % (ref 43–77)
Neutrophils Relative %: 68 % (ref 43–77)
Neutrophils Relative %: 77 % (ref 43–77)

## 2010-11-11 LAB — LIPASE, BLOOD: Lipase: 16 U/L (ref 11–59)

## 2010-11-11 LAB — URINE MICROSCOPIC-ADD ON

## 2010-11-11 LAB — GC/CHLAMYDIA PROBE AMP, GENITAL: Chlamydia, DNA Probe: NEGATIVE

## 2010-11-11 LAB — WET PREP, GENITAL
Clue Cells Wet Prep HPF POC: NONE SEEN
WBC, Wet Prep HPF POC: NONE SEEN

## 2010-11-12 ENCOUNTER — Emergency Department (HOSPITAL_COMMUNITY)
Admission: EM | Admit: 2010-11-12 | Discharge: 2010-11-12 | Discharge status: Against Medical Advice | Payer: Medicaid Other | Attending: Emergency Medicine | Admitting: Emergency Medicine

## 2010-11-12 DIAGNOSIS — F3130 Bipolar disorder, current episode depressed, mild or moderate severity, unspecified: Secondary | ICD-10-CM | POA: Insufficient documentation

## 2010-11-12 DIAGNOSIS — J45909 Unspecified asthma, uncomplicated: Secondary | ICD-10-CM | POA: Insufficient documentation

## 2010-11-12 DIAGNOSIS — H9209 Otalgia, unspecified ear: Secondary | ICD-10-CM | POA: Insufficient documentation

## 2010-11-12 DIAGNOSIS — J3489 Other specified disorders of nose and nasal sinuses: Secondary | ICD-10-CM | POA: Insufficient documentation

## 2010-11-12 DIAGNOSIS — R112 Nausea with vomiting, unspecified: Secondary | ICD-10-CM | POA: Insufficient documentation

## 2010-11-12 DIAGNOSIS — K219 Gastro-esophageal reflux disease without esophagitis: Secondary | ICD-10-CM | POA: Insufficient documentation

## 2010-11-12 DIAGNOSIS — Z79899 Other long term (current) drug therapy: Secondary | ICD-10-CM | POA: Insufficient documentation

## 2010-11-12 DIAGNOSIS — R1013 Epigastric pain: Secondary | ICD-10-CM | POA: Insufficient documentation

## 2010-11-12 LAB — URINALYSIS, ROUTINE W REFLEX MICROSCOPIC
Bilirubin Urine: NEGATIVE
Bilirubin Urine: NEGATIVE
Glucose, UA: NEGATIVE mg/dL
Ketones, ur: NEGATIVE mg/dL
Ketones, ur: NEGATIVE mg/dL
Nitrite: NEGATIVE
Protein, ur: NEGATIVE mg/dL
Specific Gravity, Urine: 1.027 (ref 1.005–1.030)
Urobilinogen, UA: 0.2 mg/dL (ref 0.0–1.0)
pH: 6 (ref 5.0–8.0)
pH: 6.5 (ref 5.0–8.0)

## 2010-11-12 LAB — CBC
HCT: 36.4 % (ref 36.0–46.0)
HCT: 35 % — ABNORMAL LOW (ref 36.0–46.0)
Hemoglobin: 12 g/dL (ref 12.0–15.0)
MCH: 30.3 pg (ref 26.0–34.0)
MCHC: 34.3 g/dL (ref 30.0–36.0)
MCV: 94 fL (ref 78.0–100.0)
MCV: 88.4 fL (ref 78.0–100.0)
Platelets: 312 10*3/uL (ref 150–400)
Platelets: 387 K/uL (ref 150–400)
RBC: 3.87 MIL/uL (ref 3.87–5.11)
RBC: 3.96 MIL/uL (ref 3.87–5.11)
RDW: 15.3 % (ref 11.5–15.5)
WBC: 20.4 10*3/uL — ABNORMAL HIGH (ref 4.0–10.5)
WBC: 16.3 K/uL — ABNORMAL HIGH (ref 4.0–10.5)

## 2010-11-12 LAB — COMPREHENSIVE METABOLIC PANEL WITH GFR
AST: 14 U/L (ref 0–37)
Albumin: 3.6 g/dL (ref 3.5–5.2)
Alkaline Phosphatase: 78 U/L (ref 39–117)
BUN: 15 mg/dL (ref 6–23)
Calcium: 8.8 mg/dL (ref 8.4–10.5)
Chloride: 105 meq/L (ref 96–112)
Creatinine, Ser: 1.07 mg/dL (ref 0.4–1.2)
GFR calc Af Amer: >60 mL/min (ref >60)
GFR calc non Af Amer: 59 mL/min — ABNORMAL LOW (ref >60)
Total Protein: 7.5 g/dL (ref 6.0–8.3)

## 2010-11-12 LAB — POCT PREGNANCY, URINE
Preg Test, Ur: NEGATIVE
Preg Test, Ur: NEGATIVE

## 2010-11-12 LAB — DIFFERENTIAL
Basophils Absolute: 0.1 10*3/uL (ref 0.0–0.1)
Basophils Relative: 1 % (ref 0–1)
Eosinophils Absolute: 0.7 K/uL (ref 0.0–0.7)
Eosinophils Relative: 4 % (ref 0–5)
Lymphocytes Relative: 12 % (ref 12–46)
Lymphocytes Relative: 25 % (ref 12–46)
Lymphs Abs: 2.4 10*3/uL (ref 0.7–4.0)
Lymphs Abs: 4.1 10*3/uL — ABNORMAL HIGH (ref 0.7–4.0)
Monocytes Absolute: 0.9 10*3/uL (ref 0.1–1.0)
Monocytes Relative: 1 % — ABNORMAL LOW (ref 3–12)
Monocytes Relative: 5 % (ref 3–12)
Neutro Abs: 10.6 10*3/uL — ABNORMAL HIGH (ref 1.7–7.7)
Neutrophils Relative %: 86 % — ABNORMAL HIGH (ref 43–77)
Neutrophils Relative %: 65 % (ref 43–77)

## 2010-11-12 LAB — COMPREHENSIVE METABOLIC PANEL
ALT: 17 U/L (ref 0–35)
CO2: 26 mEq/L (ref 19–32)
Glucose, Bld: 83 mg/dL (ref 70–99)
Potassium: 3.7 mEq/L (ref 3.5–5.1)
Sodium: 136 mEq/L (ref 135–145)
Total Bilirubin: 0.2 mg/dL — ABNORMAL LOW (ref 0.3–1.2)

## 2010-11-12 LAB — HEPATIC FUNCTION PANEL
ALT: 16 U/L (ref 0–35)
Albumin: 3.6 g/dL (ref 3.5–5.2)
Alkaline Phosphatase: 79 U/L (ref 39–117)
Total Bilirubin: 0.5 mg/dL (ref 0.3–1.2)
Total Protein: 6.8 g/dL (ref 6.0–8.3)

## 2010-11-12 LAB — URINE MICROSCOPIC-ADD ON

## 2010-11-12 LAB — POCT I-STAT, CHEM 8
BUN: 11 mg/dL (ref 6–23)
Calcium, Ion: 1.14 mmol/L (ref 1.12–1.32)
Chloride: 105 mEq/L (ref 96–112)
HCT: 43 % (ref 36.0–46.0)
Sodium: 140 mEq/L (ref 135–145)

## 2010-11-12 LAB — LIPASE, BLOOD: Lipase: 23 U/L (ref 11–59)

## 2010-11-13 LAB — URINE MICROSCOPIC-ADD ON

## 2010-11-13 LAB — URINALYSIS, ROUTINE W REFLEX MICROSCOPIC
Bilirubin Urine: NEGATIVE
Glucose, UA: NEGATIVE mg/dL
Glucose, UA: NEGATIVE mg/dL
Leukocytes, UA: NEGATIVE
Leukocytes, UA: NEGATIVE
Nitrite: NEGATIVE
Protein, ur: NEGATIVE mg/dL
Specific Gravity, Urine: 1.013 (ref 1.005–1.030)
Specific Gravity, Urine: 1.013 (ref 1.005–1.030)
Urobilinogen, UA: 0.2 mg/dL (ref 0.0–1.0)
pH: 6 (ref 5.0–8.0)
pH: 7 (ref 5.0–8.0)
pH: 6 (ref 5.0–8.0)

## 2010-11-13 LAB — DIFFERENTIAL
Basophils Absolute: 0.1 10*3/uL (ref 0.0–0.1)
Basophils Relative: 1 % (ref 0–1)
Basophils Relative: 1 % (ref 0–1)
Eosinophils Absolute: 0.3 10*3/uL (ref 0.0–0.7)
Eosinophils Absolute: 0.5 10*3/uL (ref 0.0–0.7)
Lymphocytes Relative: 24 % (ref 12–46)
Lymphocytes Relative: 25 % (ref 12–46)
Lymphs Abs: 2.8 10*3/uL (ref 0.7–4.0)
Lymphs Abs: 2.7 10*3/uL (ref 0.7–4.0)
Monocytes Absolute: 0.7 10*3/uL (ref 0.1–1.0)
Monocytes Absolute: 0.7 10*3/uL (ref 0.1–1.0)
Monocytes Absolute: 0.5 10*3/uL (ref 0.1–1.0)
Monocytes Relative: 5 % (ref 3–12)
Monocytes Relative: 5 % (ref 3–12)
Monocytes Relative: 5 % (ref 3–12)
Neutro Abs: 10.3 10*3/uL — ABNORMAL HIGH (ref 1.7–7.7)
Neutro Abs: 9.8 10*3/uL — ABNORMAL HIGH (ref 1.7–7.7)
Neutro Abs: 7.3 10*3/uL (ref 1.7–7.7)
Neutrophils Relative %: 67 % (ref 43–77)
Neutrophils Relative %: 72 % (ref 43–77)
Neutrophils Relative %: 68 % (ref 43–77)

## 2010-11-13 LAB — COMPREHENSIVE METABOLIC PANEL
ALT: 24 U/L (ref 0–35)
ALT: 17 U/L (ref 0–35)
AST: 19 U/L (ref 0–37)
Albumin: 3.6 g/dL (ref 3.5–5.2)
Albumin: 3.9 g/dL (ref 3.5–5.2)
Albumin: 3.7 g/dL (ref 3.5–5.2)
Alkaline Phosphatase: 81 U/L (ref 39–117)
Alkaline Phosphatase: 90 U/L (ref 39–117)
Alkaline Phosphatase: 86 U/L (ref 39–117)
BUN: 8 mg/dL (ref 6–23)
BUN: 12 mg/dL (ref 6–23)
CO2: 26 mEq/L (ref 19–32)
CO2: 27 mEq/L (ref 19–32)
Calcium: 8.5 mg/dL (ref 8.4–10.5)
Chloride: 104 mEq/L (ref 96–112)
Chloride: 108 mEq/L (ref 96–112)
Creatinine, Ser: 1.07 mg/dL (ref 0.4–1.2)
Creatinine, Ser: 1.3 mg/dL — ABNORMAL HIGH (ref 0.4–1.2)
GFR calc Af Amer: 58 mL/min — ABNORMAL LOW (ref >60)
GFR calc non Af Amer: 60 mL/min — ABNORMAL LOW (ref >60)
Glucose, Bld: 84 mg/dL (ref 70–99)
Glucose, Bld: 85 mg/dL (ref 70–99)
Glucose, Bld: 93 mg/dL (ref 70–99)
Potassium: 3.3 mEq/L — ABNORMAL LOW (ref 3.5–5.1)
Potassium: 3.5 mEq/L (ref 3.5–5.1)
Potassium: 3.8 mEq/L (ref 3.5–5.1)
Sodium: 135 mEq/L (ref 135–145)
Sodium: 142 mEq/L (ref 135–145)
Total Bilirubin: 0.4 mg/dL (ref 0.3–1.2)
Total Protein: 7.5 g/dL (ref 6.0–8.3)
Total Protein: 6.2 g/dL (ref 6.0–8.3)
Total Protein: 7.6 g/dL (ref 6.0–8.3)

## 2010-11-13 LAB — RAPID URINE DRUG SCREEN, HOSP PERFORMED
Barbiturates: NOT DETECTED
Cocaine: NOT DETECTED
Opiates: NOT DETECTED
Tetrahydrocannabinol: NOT DETECTED

## 2010-11-13 LAB — POCT PREGNANCY, URINE
Preg Test, Ur: NEGATIVE
Preg Test, Ur: NEGATIVE

## 2010-11-13 LAB — GLUCOSE, CAPILLARY

## 2010-11-13 LAB — CBC
HCT: 38.1 % (ref 36.0–46.0)
HCT: 38.4 % (ref 36.0–46.0)
Hemoglobin: 11.7 g/dL — ABNORMAL LOW (ref 12.0–15.0)
Hemoglobin: 13.1 g/dL (ref 12.0–15.0)
Hemoglobin: 11.7 g/dL — ABNORMAL LOW (ref 12.0–15.0)
MCHC: 33.8 g/dL (ref 30.0–36.0)
MCV: 93.5 fL (ref 78.0–100.0)
MCV: 93.3 fL (ref 78.0–100.0)
Platelets: 313 10*3/uL (ref 150–400)
Platelets: 332 10*3/uL (ref 150–400)
Platelets: 317 10*3/uL (ref 150–400)
RBC: 3.71 MIL/uL — ABNORMAL LOW (ref 3.87–5.11)
RDW: 14.9 % (ref 11.5–15.5)
RDW: 14.6 % (ref 11.5–15.5)
RDW: 14.7 % (ref 11.5–15.5)
WBC: 14.6 10*3/uL — ABNORMAL HIGH (ref 4.0–10.5)
WBC: 11.8 10*3/uL — ABNORMAL HIGH (ref 4.0–10.5)

## 2010-11-13 LAB — LIPASE, BLOOD: Lipase: 18 U/L (ref 11–59)

## 2010-11-14 LAB — BASIC METABOLIC PANEL
CO2: 28 mEq/L (ref 19–32)
Chloride: 103 mEq/L (ref 96–112)
Creatinine, Ser: 1.18 mg/dL (ref 0.4–1.2)
GFR calc Af Amer: >60 mL/min (ref >60)
GFR calc non Af Amer: >60 mL/min (ref >60)
Glucose, Bld: 83 mg/dL (ref 70–99)
Potassium: 3.4 mEq/L — ABNORMAL LOW (ref 3.5–5.1)
Potassium: 3.8 mEq/L (ref 3.5–5.1)
Sodium: 136 mEq/L (ref 135–145)
Sodium: 138 mEq/L (ref 135–145)

## 2010-11-14 LAB — CBC
HCT: 34.4 % — ABNORMAL LOW (ref 36.0–46.0)
HCT: 36.8 % (ref 36.0–46.0)
HCT: 32.3 % — ABNORMAL LOW (ref 36.0–46.0)
Hemoglobin: 11.8 g/dL — ABNORMAL LOW (ref 12.0–15.0)
Hemoglobin: 11.8 g/dL — ABNORMAL LOW (ref 12.0–15.0)
Hemoglobin: 12.5 g/dL (ref 12.0–15.0)
Hemoglobin: 10.8 g/dL — ABNORMAL LOW (ref 12.0–15.0)
Hemoglobin: 11.6 g/dL — ABNORMAL LOW (ref 12.0–15.0)
MCHC: 34 g/dL (ref 30.0–36.0)
MCHC: 34.4 g/dL (ref 30.0–36.0)
MCHC: 34.4 g/dL (ref 30.0–36.0)
MCV: 92 fL (ref 78.0–100.0)
MCV: 93.6 fL (ref 78.0–100.0)
MCV: 94.1 fL (ref 78.0–100.0)
Platelets: 328 10*3/uL (ref 150–400)
RBC: 3.76 MIL/uL — ABNORMAL LOW (ref 3.87–5.11)
RBC: 3.98 MIL/uL (ref 3.87–5.11)
RBC: 3.57 MIL/uL — ABNORMAL LOW (ref 3.87–5.11)
RDW: 14.7 % (ref 11.5–15.5)
RDW: 15 % (ref 11.5–15.5)
RDW: 15.2 % (ref 11.5–15.5)
RDW: 15.2 % (ref 11.5–15.5)
WBC: 13.1 10*3/uL — ABNORMAL HIGH (ref 4.0–10.5)
WBC: 8 10*3/uL (ref 4.0–10.5)

## 2010-11-14 LAB — COMPREHENSIVE METABOLIC PANEL
ALT: 19 U/L (ref 0–35)
ALT: 21 U/L (ref 0–35)
ALT: 21 U/L (ref 0–35)
AST: 15 U/L (ref 0–37)
Albumin: 3.7 g/dL (ref 3.5–5.2)
Alkaline Phosphatase: 81 U/L (ref 39–117)
Alkaline Phosphatase: 90 U/L (ref 39–117)
Alkaline Phosphatase: 74 U/L (ref 39–117)
BUN: 10 mg/dL (ref 6–23)
BUN: 11 mg/dL (ref 6–23)
CO2: 26 mEq/L (ref 19–32)
CO2: 27 mEq/L (ref 19–32)
CO2: 29 mEq/L (ref 19–32)
Calcium: 9.4 mg/dL (ref 8.4–10.5)
Calcium: 8.9 mg/dL (ref 8.4–10.5)
Chloride: 103 mEq/L (ref 96–112)
Chloride: 107 mEq/L (ref 96–112)
Creatinine, Ser: 1.12 mg/dL (ref 0.4–1.2)
Creatinine, Ser: 1.15 mg/dL (ref 0.4–1.2)
Creatinine, Ser: 1.16 mg/dL (ref 0.4–1.2)
GFR calc Af Amer: >60 mL/min (ref >60)
GFR calc Af Amer: >60 mL/min (ref >60)
GFR calc non Af Amer: 41 mL/min — ABNORMAL LOW (ref >60)
GFR calc non Af Amer: 51 mL/min — ABNORMAL LOW (ref >60)
GFR calc non Af Amer: 55 mL/min — ABNORMAL LOW (ref >60)
Glucose, Bld: 80 mg/dL (ref 70–99)
Glucose, Bld: 83 mg/dL (ref 70–99)
Glucose, Bld: 84 mg/dL (ref 70–99)
Potassium: 3.8 mEq/L (ref 3.5–5.1)
Potassium: 3.9 mEq/L (ref 3.5–5.1)
Potassium: 3.9 mEq/L (ref 3.5–5.1)
Potassium: 4.1 mEq/L (ref 3.5–5.1)
Sodium: 139 mEq/L (ref 135–145)
Sodium: 136 mEq/L (ref 135–145)
Sodium: 138 mEq/L (ref 135–145)
Total Bilirubin: 0.2 mg/dL — ABNORMAL LOW (ref 0.3–1.2)
Total Bilirubin: 0.4 mg/dL (ref 0.3–1.2)
Total Protein: 7.2 g/dL (ref 6.0–8.3)
Total Protein: 6.4 g/dL (ref 6.0–8.3)
Total Protein: 6.8 g/dL (ref 6.0–8.3)

## 2010-11-14 LAB — DIFFERENTIAL
Basophils Absolute: 0.1 10*3/uL (ref 0.0–0.1)
Basophils Absolute: 0.2 10*3/uL — ABNORMAL HIGH (ref 0.0–0.1)
Basophils Absolute: 0.4 10*3/uL — ABNORMAL HIGH (ref 0.0–0.1)
Basophils Relative: 1 % (ref 0–1)
Basophils Relative: 5 % — ABNORMAL HIGH (ref 0–1)
Eosinophils Absolute: 0.2 10*3/uL (ref 0.0–0.7)
Eosinophils Absolute: 0.2 10*3/uL (ref 0.0–0.7)
Eosinophils Absolute: 0.2 10*3/uL (ref 0.0–0.7)
Eosinophils Absolute: 0.2 10*3/uL (ref 0.0–0.7)
Eosinophils Relative: 2 % (ref 0–5)
Eosinophils Relative: 2 % (ref 0–5)
Lymphocytes Relative: 24 % (ref 12–46)
Lymphocytes Relative: 26 % (ref 12–46)
Lymphocytes Relative: 19 % (ref 12–46)
Lymphs Abs: 2.5 10*3/uL (ref 0.7–4.0)
Lymphs Abs: 3.1 10*3/uL (ref 0.7–4.0)
Lymphs Abs: 2.1 10*3/uL (ref 0.7–4.0)
Lymphs Abs: 2.4 10*3/uL (ref 0.7–4.0)
Monocytes Absolute: 0.6 10*3/uL (ref 0.1–1.0)
Monocytes Absolute: 0.7 10*3/uL (ref 0.1–1.0)
Monocytes Relative: 5 % (ref 3–12)
Monocytes Relative: 5 % (ref 3–12)
Monocytes Relative: 5 % (ref 3–12)
Monocytes Relative: 6 % (ref 3–12)
Monocytes Relative: 5 % (ref 3–12)
Neutro Abs: 8.7 10*3/uL — ABNORMAL HIGH (ref 1.7–7.7)
Neutro Abs: 3.7 10*3/uL (ref 1.7–7.7)
Neutrophils Relative %: 68 % (ref 43–77)
Neutrophils Relative %: 69 % (ref 43–77)
Neutrophils Relative %: 75 % (ref 43–77)
Neutrophils Relative %: 51 % (ref 43–77)
Neutrophils Relative %: 73 % (ref 43–77)

## 2010-11-14 LAB — URINALYSIS, ROUTINE W REFLEX MICROSCOPIC
Bilirubin Urine: NEGATIVE
Bilirubin Urine: NEGATIVE
Bilirubin Urine: NEGATIVE
Glucose, UA: NEGATIVE mg/dL
Glucose, UA: NEGATIVE mg/dL
Glucose, UA: NEGATIVE mg/dL
Glucose, UA: NEGATIVE mg/dL
Glucose, UA: NEGATIVE mg/dL
Ketones, ur: 15 mg/dL — AB
Ketones, ur: NEGATIVE mg/dL
Ketones, ur: NEGATIVE mg/dL
Ketones, ur: NEGATIVE mg/dL
Leukocytes, UA: NEGATIVE
Leukocytes, UA: NEGATIVE
Leukocytes, UA: NEGATIVE
Nitrite: NEGATIVE
Nitrite: NEGATIVE
Nitrite: NEGATIVE
Protein, ur: NEGATIVE mg/dL
Specific Gravity, Urine: 1.024 (ref 1.005–1.030)
Specific Gravity, Urine: 1.013 (ref 1.005–1.030)
Specific Gravity, Urine: 1.015 (ref 1.005–1.030)
Urobilinogen, UA: 0.2 mg/dL (ref 0.0–1.0)
Urobilinogen, UA: 0.2 mg/dL (ref 0.0–1.0)
pH: 6 (ref 5.0–8.0)
pH: 6.5 (ref 5.0–8.0)
pH: 5.5 (ref 5.0–8.0)
pH: 6 (ref 5.0–8.0)

## 2010-11-14 LAB — URINE MICROSCOPIC-ADD ON

## 2010-11-14 LAB — LIPASE, BLOOD
Lipase: 20 U/L (ref 11–59)
Lipase: 12 U/L (ref 11–59)
Lipase: 21 U/L (ref 11–59)

## 2010-11-14 LAB — POCT PREGNANCY, URINE
Preg Test, Ur: NEGATIVE
Preg Test, Ur: NEGATIVE
Preg Test, Ur: NEGATIVE

## 2010-11-14 LAB — PREGNANCY, URINE: Preg Test, Ur: NEGATIVE

## 2010-11-15 LAB — URINALYSIS, ROUTINE W REFLEX MICROSCOPIC
Bilirubin Urine: NEGATIVE
Bilirubin Urine: NEGATIVE
Glucose, UA: NEGATIVE mg/dL
Ketones, ur: 15 mg/dL — AB
Ketones, ur: NEGATIVE mg/dL
Leukocytes, UA: NEGATIVE
Nitrite: NEGATIVE
Nitrite: NEGATIVE
Specific Gravity, Urine: 1.011 (ref 1.005–1.030)
Specific Gravity, Urine: 1.026 (ref 1.005–1.030)
Urobilinogen, UA: 0.2 mg/dL (ref 0.0–1.0)
Urobilinogen, UA: 1 mg/dL (ref 0.0–1.0)
pH: 6.5 (ref 5.0–8.0)

## 2010-11-15 LAB — URINE MICROSCOPIC-ADD ON

## 2010-11-15 LAB — COMPREHENSIVE METABOLIC PANEL
ALT: 15 U/L (ref 0–35)
AST: 17 U/L (ref 0–37)
Alkaline Phosphatase: 98 U/L (ref 39–117)
CO2: 26 mEq/L (ref 19–32)
Chloride: 109 mEq/L (ref 96–112)
GFR calc Af Amer: >60 mL/min (ref >60)
GFR calc non Af Amer: 51 mL/min — ABNORMAL LOW (ref >60)
Potassium: 3.9 mEq/L (ref 3.5–5.1)
Sodium: 140 mEq/L (ref 135–145)
Total Bilirubin: 0.4 mg/dL (ref 0.3–1.2)

## 2010-11-15 LAB — DIFFERENTIAL
Basophils Absolute: 0 10*3/uL (ref 0.0–0.1)
Eosinophils Absolute: 0.3 10*3/uL (ref 0.0–0.7)
Eosinophils Relative: 3 % (ref 0–5)
Monocytes Absolute: 0.5 10*3/uL (ref 0.1–1.0)

## 2010-11-15 LAB — POCT PREGNANCY, URINE: Preg Test, Ur: NEGATIVE

## 2010-11-15 LAB — CBC
MCV: 92 fL (ref 78.0–100.0)
RBC: 4.06 MIL/uL (ref 3.87–5.11)
WBC: 10.1 10*3/uL (ref 4.0–10.5)

## 2010-11-15 LAB — PREGNANCY, URINE: Preg Test, Ur: NEGATIVE

## 2010-11-15 LAB — LIPASE, BLOOD: Lipase: 18 U/L (ref 11–59)

## 2010-11-16 LAB — POCT PREGNANCY, URINE
Preg Test, Ur: NEGATIVE
Preg Test, Ur: NEGATIVE

## 2010-11-16 LAB — DIFFERENTIAL
Basophils Absolute: 0.1 10*3/uL (ref 0.0–0.1)
Basophils Relative: 0 % (ref 0–1)
Basophils Relative: 1 % (ref 0–1)
Basophils Relative: 1 % (ref 0–1)
Eosinophils Absolute: 0.3 10*3/uL (ref 0.0–0.7)
Eosinophils Absolute: 0.4 10*3/uL (ref 0.0–0.7)
Eosinophils Absolute: 0.5 10*3/uL (ref 0.0–0.7)
Eosinophils Absolute: 0.3 10*3/uL (ref 0.0–0.7)
Eosinophils Relative: 3 % (ref 0–5)
Eosinophils Relative: 3 % (ref 0–5)
Lymphs Abs: 3.2 10*3/uL (ref 0.7–4.0)
Lymphs Abs: 3.9 10*3/uL (ref 0.7–4.0)
Monocytes Absolute: 0.5 10*3/uL (ref 0.1–1.0)
Monocytes Absolute: 0.7 10*3/uL (ref 0.1–1.0)
Monocytes Relative: 4 % (ref 3–12)
Monocytes Relative: 5 % (ref 3–12)
Neutro Abs: 9.9 10*3/uL — ABNORMAL HIGH (ref 1.7–7.7)
Neutrophils Relative %: 66 % (ref 43–77)

## 2010-11-16 LAB — COMPREHENSIVE METABOLIC PANEL
ALT: 13 U/L (ref 0–35)
ALT: 25 U/L (ref 0–35)
AST: 14 U/L (ref 0–37)
AST: 20 U/L (ref 0–37)
Albumin: 3.7 g/dL (ref 3.5–5.2)
Alkaline Phosphatase: 84 U/L (ref 39–117)
Alkaline Phosphatase: 94 U/L (ref 39–117)
CO2: 26 mEq/L (ref 19–32)
Chloride: 102 mEq/L (ref 96–112)
Creatinine, Ser: 1.09 mg/dL (ref 0.4–1.2)
GFR calc Af Amer: 55 mL/min — ABNORMAL LOW (ref >60)
GFR calc Af Amer: >60 mL/min (ref >60)
GFR calc non Af Amer: 59 mL/min — ABNORMAL LOW (ref >60)
Potassium: 3.9 mEq/L (ref 3.5–5.1)
Sodium: 139 mEq/L (ref 135–145)
Sodium: 133 mEq/L — ABNORMAL LOW (ref 135–145)
Total Bilirubin: 0.3 mg/dL (ref 0.3–1.2)
Total Protein: 7.4 g/dL (ref 6.0–8.3)

## 2010-11-16 LAB — BASIC METABOLIC PANEL
BUN: 11 mg/dL (ref 6–23)
Calcium: 9 mg/dL (ref 8.4–10.5)
Chloride: 104 mEq/L (ref 96–112)
Creatinine, Ser: 1.31 mg/dL — ABNORMAL HIGH (ref 0.4–1.2)
GFR calc Af Amer: 58 mL/min — ABNORMAL LOW (ref >60)
GFR calc non Af Amer: 48 mL/min — ABNORMAL LOW (ref >60)

## 2010-11-16 LAB — CBC
HCT: 39.2 % (ref 36.0–46.0)
Hemoglobin: 13.6 g/dL (ref 12.0–15.0)
MCV: 91.4 fL (ref 78.0–100.0)
MCV: 92.1 fL (ref 78.0–100.0)
MCV: 91.6 fL (ref 78.0–100.0)
Platelets: 336 10*3/uL (ref 150–400)
Platelets: 338 10*3/uL (ref 150–400)
RBC: 4.26 MIL/uL (ref 3.87–5.11)
RBC: 4.19 MIL/uL (ref 3.87–5.11)
RDW: 13.9 % (ref 11.5–15.5)
WBC: 12.8 10*3/uL — ABNORMAL HIGH (ref 4.0–10.5)
WBC: 15.1 10*3/uL — ABNORMAL HIGH (ref 4.0–10.5)
WBC: 10.3 10*3/uL (ref 4.0–10.5)

## 2010-11-16 LAB — URINE MICROSCOPIC-ADD ON

## 2010-11-16 LAB — URINALYSIS, ROUTINE W REFLEX MICROSCOPIC
Glucose, UA: NEGATIVE mg/dL
Glucose, UA: NEGATIVE mg/dL
Ketones, ur: 15 mg/dL — AB
Ketones, ur: NEGATIVE mg/dL
Ketones, ur: NEGATIVE mg/dL
Leukocytes, UA: NEGATIVE
Nitrite: NEGATIVE
Nitrite: NEGATIVE
Nitrite: NEGATIVE
Protein, ur: NEGATIVE mg/dL
Protein, ur: NEGATIVE mg/dL
Specific Gravity, Urine: 1.012 (ref 1.005–1.030)
pH: 5.5 (ref 5.0–8.0)
pH: 6 (ref 5.0–8.0)
pH: 7.5 (ref 5.0–8.0)
pH: 6 (ref 5.0–8.0)

## 2010-11-16 LAB — POCT I-STAT, CHEM 8
Calcium, Ion: 1.09 mmol/L — ABNORMAL LOW (ref 1.12–1.32)
Chloride: 104 mEq/L (ref 96–112)
Glucose, Bld: 78 mg/dL (ref 70–99)
HCT: 41 % (ref 36.0–46.0)
Hemoglobin: 13.9 g/dL (ref 12.0–15.0)

## 2010-11-16 LAB — URINE CULTURE: Colony Count: NO GROWTH

## 2010-11-16 LAB — HEPATIC FUNCTION PANEL
Alkaline Phosphatase: 89 U/L (ref 39–117)
Indirect Bilirubin: 0.3 mg/dL (ref 0.3–0.9)
Total Bilirubin: 0.5 mg/dL (ref 0.3–1.2)

## 2010-11-16 LAB — LIPASE, BLOOD
Lipase: 14 U/L (ref 11–59)
Lipase: 21 U/L (ref 11–59)

## 2010-11-17 LAB — COMPREHENSIVE METABOLIC PANEL
ALT: 22 U/L (ref 0–35)
AST: 19 U/L (ref 0–37)
Albumin: 3.4 g/dL — ABNORMAL LOW (ref 3.5–5.2)
Albumin: 3.4 g/dL — ABNORMAL LOW (ref 3.5–5.2)
Alkaline Phosphatase: 76 U/L (ref 39–117)
Alkaline Phosphatase: 76 U/L (ref 39–117)
BUN: 7 mg/dL (ref 6–23)
CO2: 27 mEq/L (ref 19–32)
Calcium: 9 mg/dL (ref 8.4–10.5)
Chloride: 104 mEq/L (ref 96–112)
Creatinine, Ser: 1.13 mg/dL (ref 0.4–1.2)
GFR calc non Af Amer: 57 mL/min — ABNORMAL LOW (ref >60)
Potassium: 3.5 mEq/L (ref 3.5–5.1)
Potassium: 3.6 mEq/L (ref 3.5–5.1)
Sodium: 138 mEq/L (ref 135–145)
Total Bilirubin: 0.3 mg/dL (ref 0.3–1.2)
Total Protein: 6.9 g/dL (ref 6.0–8.3)

## 2010-11-17 LAB — POCT I-STAT, CHEM 8
Calcium, Ion: 0.97 mmol/L — ABNORMAL LOW (ref 1.12–1.32)
Chloride: 111 mEq/L (ref 96–112)
Glucose, Bld: 116 mg/dL — ABNORMAL HIGH (ref 70–99)
HCT: 38 % (ref 36.0–46.0)
Hemoglobin: 12.9 g/dL (ref 12.0–15.0)
TCO2: 20 mmol/L (ref 0–100)

## 2010-11-17 LAB — URINE MICROSCOPIC-ADD ON

## 2010-11-17 LAB — CBC
HCT: 38.1 % (ref 36.0–46.0)
MCV: 92.7 fL (ref 78.0–100.0)
Platelets: 336 10*3/uL (ref 150–400)
Platelets: 341 10*3/uL (ref 150–400)
RBC: 4.11 MIL/uL (ref 3.87–5.11)
RDW: 13.8 % (ref 11.5–15.5)
WBC: 12.5 10*3/uL — ABNORMAL HIGH (ref 4.0–10.5)

## 2010-11-17 LAB — URINALYSIS, ROUTINE W REFLEX MICROSCOPIC
Bilirubin Urine: NEGATIVE
Bilirubin Urine: NEGATIVE
Glucose, UA: NEGATIVE mg/dL
Ketones, ur: NEGATIVE mg/dL
Nitrite: NEGATIVE
Protein, ur: NEGATIVE mg/dL
Specific Gravity, Urine: 1.023 (ref 1.005–1.030)
Urobilinogen, UA: 0.2 mg/dL (ref 0.0–1.0)
Urobilinogen, UA: 0.2 mg/dL (ref 0.0–1.0)

## 2010-11-17 LAB — DIFFERENTIAL
Basophils Relative: 1 % (ref 0–1)
Eosinophils Absolute: 0.4 10*3/uL (ref 0.0–0.7)
Eosinophils Absolute: 0.5 10*3/uL (ref 0.0–0.7)
Eosinophils Relative: 4 % (ref 0–5)
Lymphs Abs: 3.5 10*3/uL (ref 0.7–4.0)
Monocytes Absolute: 0.6 10*3/uL (ref 0.1–1.0)
Monocytes Absolute: 0.7 10*3/uL (ref 0.1–1.0)
Monocytes Relative: 5 % (ref 3–12)
Neutro Abs: 7.2 10*3/uL (ref 1.7–7.7)
Neutro Abs: 8.1 10*3/uL — ABNORMAL HIGH (ref 1.7–7.7)

## 2010-11-17 LAB — POCT PREGNANCY, URINE
Preg Test, Ur: NEGATIVE
Preg Test, Ur: NEGATIVE

## 2010-11-22 LAB — DIFFERENTIAL
Basophils Absolute: 0.1 10*3/uL (ref 0.0–0.1)
Basophils Relative: 1 % (ref 0–1)
Eosinophils Absolute: 0.4 10*3/uL (ref 0.0–0.7)
Eosinophils Relative: 4 % (ref 0–5)
Lymphocytes Relative: 28 % (ref 12–46)
Lymphs Abs: 3.3 10*3/uL (ref 0.7–4.0)
Monocytes Absolute: 0.6 10*3/uL (ref 0.1–1.0)
Monocytes Relative: 5 % (ref 3–12)
Neutro Abs: 7.4 10*3/uL (ref 1.7–7.7)
Neutrophils Relative %: 63 % (ref 43–77)

## 2010-11-22 LAB — URINALYSIS, ROUTINE W REFLEX MICROSCOPIC
Bilirubin Urine: NEGATIVE
Leukocytes, UA: NEGATIVE
Nitrite: NEGATIVE
Specific Gravity, Urine: 1.009 (ref 1.005–1.030)
pH: 6 (ref 5.0–8.0)

## 2010-11-22 LAB — CBC
HCT: 41 % (ref 36.0–46.0)
Hemoglobin: 13.6 g/dL (ref 12.0–15.0)
MCHC: 33.2 g/dL (ref 30.0–36.0)
MCV: 92.8 fL (ref 78.0–100.0)
Platelets: 336 10*3/uL (ref 150–400)
RBC: 4.42 MIL/uL (ref 3.87–5.11)
RDW: 14.3 % (ref 11.5–15.5)
WBC: 11.8 10*3/uL — ABNORMAL HIGH (ref 4.0–10.5)

## 2010-11-22 LAB — COMPREHENSIVE METABOLIC PANEL
ALT: 19 U/L (ref 0–35)
AST: 18 U/L (ref 0–37)
Albumin: 3.7 g/dL (ref 3.5–5.2)
Alkaline Phosphatase: 87 U/L (ref 39–117)
BUN: 6 mg/dL (ref 6–23)
CO2: 25 mEq/L (ref 19–32)
Calcium: 9.1 mg/dL (ref 8.4–10.5)
Chloride: 104 mEq/L (ref 96–112)
Creatinine, Ser: 1.11 mg/dL (ref 0.4–1.2)
GFR calc Af Amer: >60 mL/min (ref >60)
GFR calc non Af Amer: 58 mL/min — ABNORMAL LOW (ref >60)
Glucose, Bld: 93 mg/dL (ref 70–99)
Potassium: 3.8 mEq/L (ref 3.5–5.1)
Sodium: 135 mEq/L (ref 135–145)
Total Bilirubin: 0.8 mg/dL (ref 0.3–1.2)
Total Protein: 7.6 g/dL (ref 6.0–8.3)

## 2010-11-22 LAB — URINE MICROSCOPIC-ADD ON

## 2010-11-22 LAB — POCT PREGNANCY, URINE: Preg Test, Ur: NEGATIVE

## 2010-11-22 LAB — LIPASE, BLOOD: Lipase: 17 U/L (ref 11–59)

## 2010-12-03 IMAGING — CR DG ABDOMEN ACUTE W/ 1V CHEST
3 series · 3 of 3 positions shown · non-contrast
Comparison: Previous CT and ultrasound examinations.

CLINICAL DATA: Abdominal pain, nausea and vomiting.  Smoker.
History of asthma and obesity.

ACUTE ABDOMEN SERIES (ABDOMEN 2 VIEW & CHEST 1 VIEW)

[w chest pa]
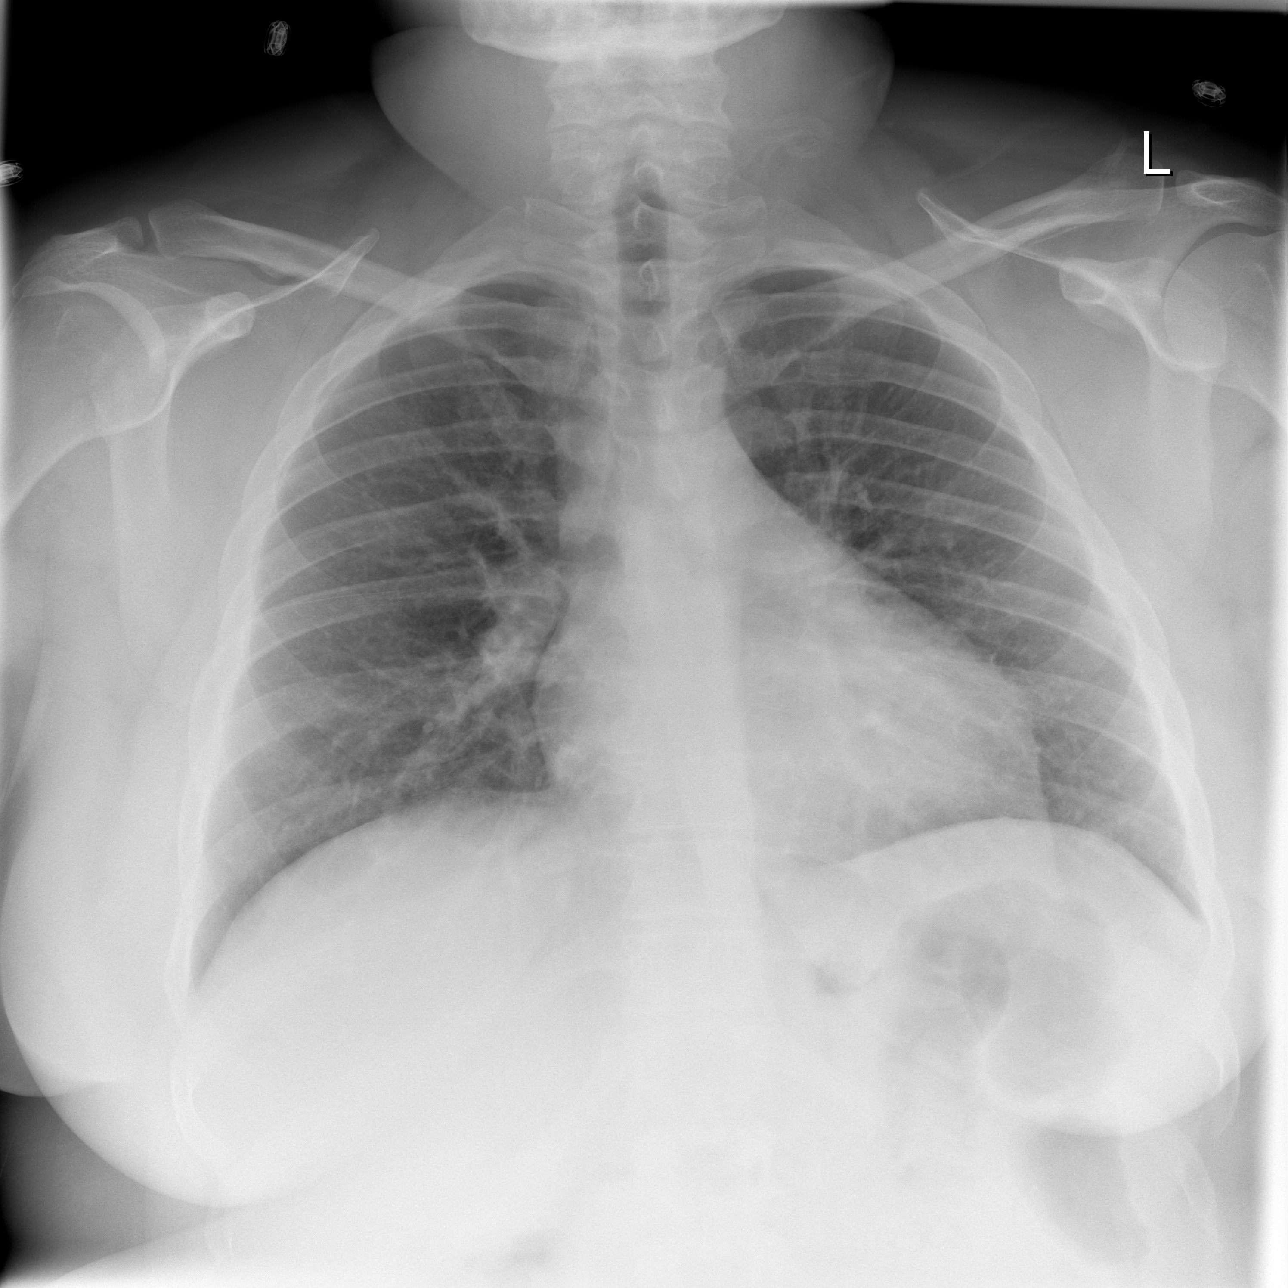

[w abdomen upright]
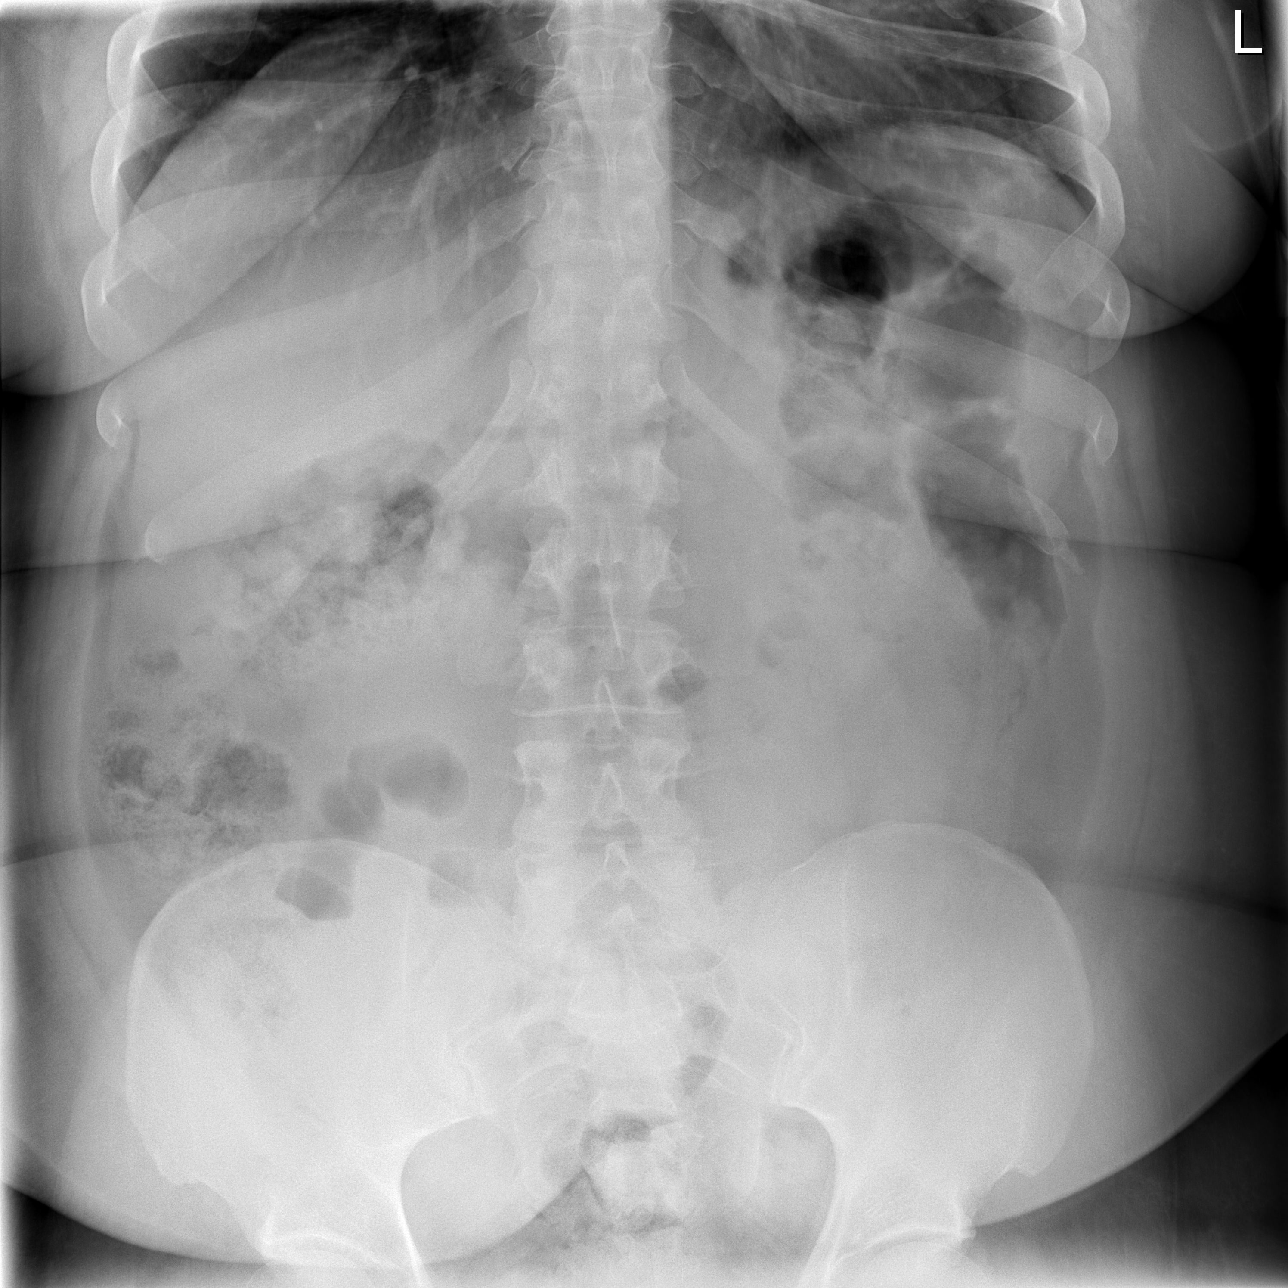

[t abdomen supine]
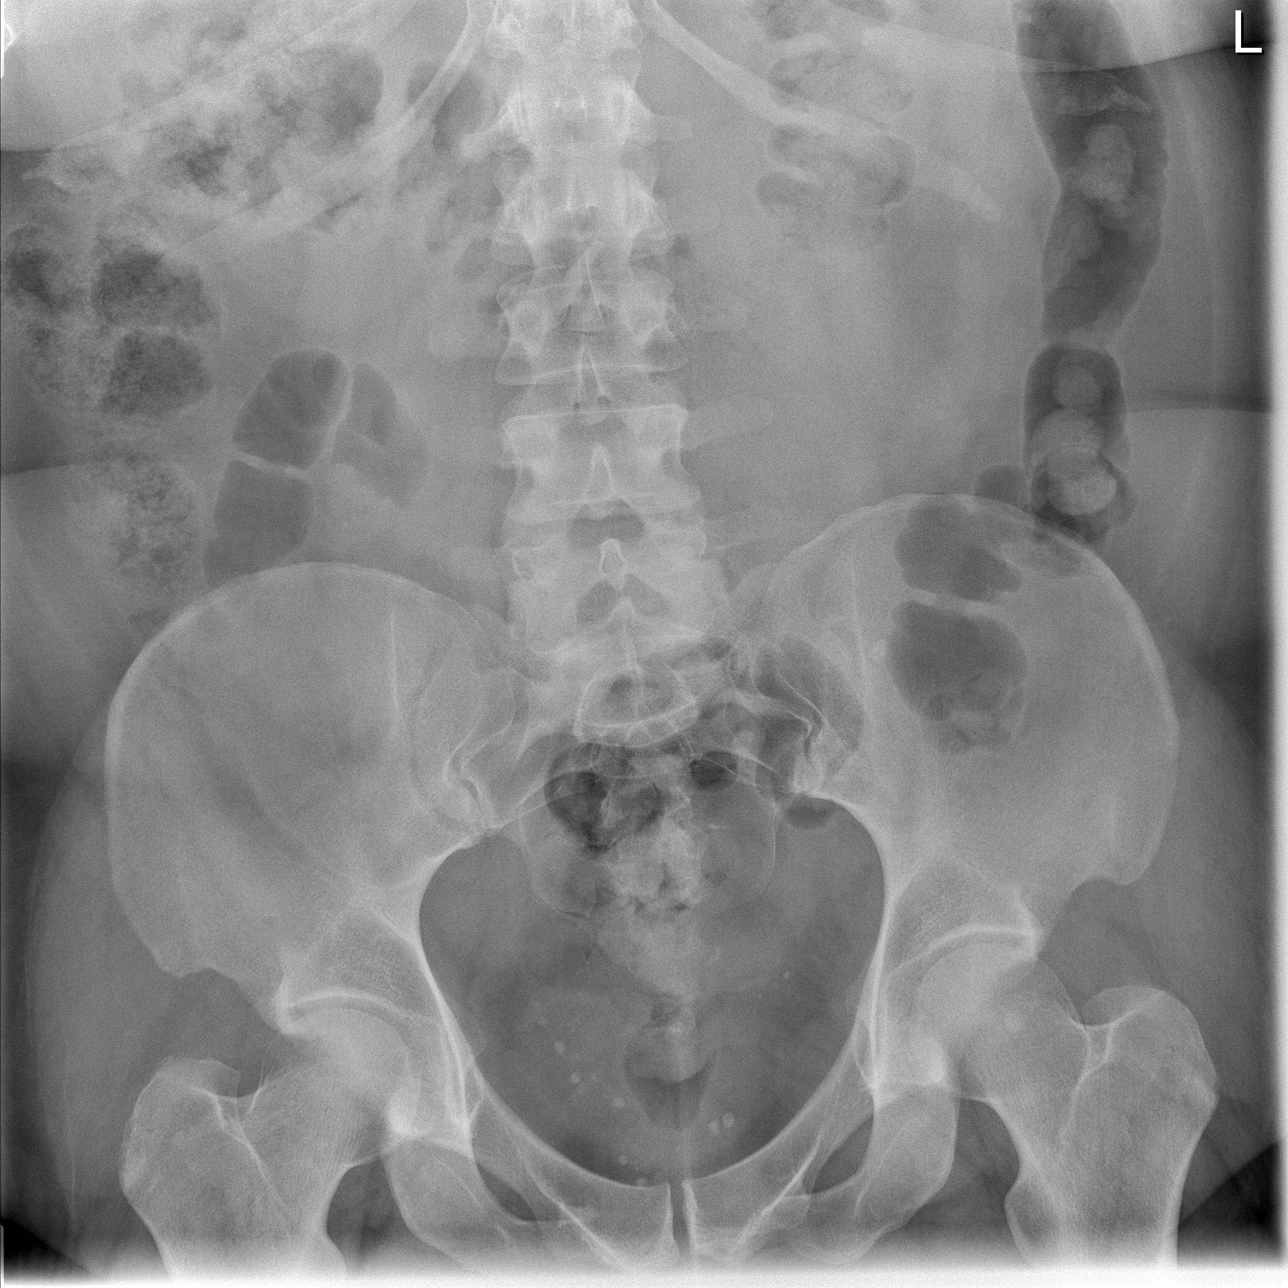

[3 of 3 positions shown; findings below may reference images not displayed]

FINDINGS: Poor inspiration.  Grossly normal sized heart.  Minimal
diffuse peribronchial thickening.  Normal bowel gas pattern without
free peritoneal air.  Unremarkable bones.
IMPRESSION: Minimal chronic bronchitic changes.  No acute abnormality.

## 2011-01-06 ENCOUNTER — Emergency Department (HOSPITAL_COMMUNITY)
Admission: EM | Admit: 2011-01-06 | Discharge: 2011-01-06 | Discharge status: Against Medical Advice | Payer: Medicaid Other | Attending: Emergency Medicine | Admitting: Emergency Medicine

## 2011-01-06 DIAGNOSIS — K219 Gastro-esophageal reflux disease without esophagitis: Secondary | ICD-10-CM | POA: Insufficient documentation

## 2011-01-06 DIAGNOSIS — Z79899 Other long term (current) drug therapy: Secondary | ICD-10-CM | POA: Insufficient documentation

## 2011-01-06 DIAGNOSIS — J45909 Unspecified asthma, uncomplicated: Secondary | ICD-10-CM | POA: Insufficient documentation

## 2011-01-06 DIAGNOSIS — E669 Obesity, unspecified: Secondary | ICD-10-CM | POA: Insufficient documentation

## 2011-01-06 DIAGNOSIS — F3130 Bipolar disorder, current episode depressed, mild or moderate severity, unspecified: Secondary | ICD-10-CM | POA: Insufficient documentation

## 2011-01-06 DIAGNOSIS — R1011 Right upper quadrant pain: Secondary | ICD-10-CM | POA: Insufficient documentation

## 2011-01-06 DIAGNOSIS — R112 Nausea with vomiting, unspecified: Secondary | ICD-10-CM | POA: Insufficient documentation

## 2011-01-06 LAB — URINALYSIS, ROUTINE W REFLEX MICROSCOPIC
Glucose, UA: NEGATIVE mg/dL
Leukocytes, UA: NEGATIVE
Nitrite: NEGATIVE
Protein, ur: NEGATIVE mg/dL
Urobilinogen, UA: 0.2 mg/dL (ref 0.0–1.0)

## 2011-01-06 LAB — POCT PREGNANCY, URINE: Preg Test, Ur: NEGATIVE

## 2011-01-06 LAB — URINE MICROSCOPIC-ADD ON

## 2011-05-04 LAB — WET PREP, GENITAL: Trich, Wet Prep: NONE SEEN

## 2011-05-04 LAB — CBC
HCT: 38.6
Hemoglobin: 13.5
MCHC: 34.9
MCV: 92
Platelets: 375
RDW: 14.5

## 2011-05-04 LAB — DIFFERENTIAL
Basophils Absolute: 0.1
Eosinophils Absolute: 0.6
Eosinophils Relative: 4
Monocytes Absolute: 0.6

## 2011-05-04 LAB — URINALYSIS, ROUTINE W REFLEX MICROSCOPIC
Bilirubin Urine: NEGATIVE
Nitrite: NEGATIVE
Protein, ur: NEGATIVE
Specific Gravity, Urine: 1.029
Urobilinogen, UA: 1

## 2011-05-04 LAB — URINE MICROSCOPIC-ADD ON

## 2011-05-04 LAB — POCT I-STAT, CHEM 8
BUN: 9
Calcium, Ion: 1.07 — ABNORMAL LOW
HCT: 40
Sodium: 140
TCO2: 21

## 2011-05-04 LAB — URINE CULTURE

## 2011-05-04 LAB — POCT PREGNANCY, URINE: Operator id: 173591

## 2011-05-05 LAB — DIFFERENTIAL
Basophils Absolute: 0.1
Basophils Relative: 1
Lymphocytes Relative: 21
Neutro Abs: 11.9 — ABNORMAL HIGH
Neutrophils Relative %: 71

## 2011-05-05 LAB — URINALYSIS, ROUTINE W REFLEX MICROSCOPIC
Bilirubin Urine: NEGATIVE
Leukocytes, UA: NEGATIVE
Nitrite: NEGATIVE
Specific Gravity, Urine: 1.036 — ABNORMAL HIGH
Urobilinogen, UA: 1
pH: 6

## 2011-05-05 LAB — CBC
MCHC: 33.9
Platelets: 364
RDW: 14.9

## 2011-05-05 LAB — HEPATIC FUNCTION PANEL
ALT: 20
AST: 15
Bilirubin, Direct: 0.1
Indirect Bilirubin: 0.5
Total Bilirubin: 0.6

## 2011-05-05 LAB — POCT I-STAT, CHEM 8
Calcium, Ion: 1.07 — ABNORMAL LOW
Creatinine, Ser: 1.2
Glucose, Bld: 84
Hemoglobin: 14.3
Potassium: 3.8

## 2011-05-05 LAB — POCT PREGNANCY, URINE
Operator id: 294511
Preg Test, Ur: NEGATIVE

## 2011-05-05 LAB — URINE MICROSCOPIC-ADD ON

## 2011-05-11 ENCOUNTER — Emergency Department (HOSPITAL_COMMUNITY)
Admission: EM | Admit: 2011-05-11 | Discharge: 2011-05-11 | Disposition: A | Discharge status: Home / Self Care | Payer: Medicaid Other | Attending: Emergency Medicine | Admitting: Emergency Medicine

## 2011-05-11 ENCOUNTER — Encounter: Payer: Self-pay | Admitting: Emergency Medicine

## 2011-05-11 ENCOUNTER — Emergency Department (HOSPITAL_COMMUNITY): Payer: Medicaid Other

## 2011-05-11 DIAGNOSIS — F17200 Nicotine dependence, unspecified, uncomplicated: Secondary | ICD-10-CM | POA: Insufficient documentation

## 2011-05-11 DIAGNOSIS — R1011 Right upper quadrant pain: Secondary | ICD-10-CM | POA: Insufficient documentation

## 2011-05-11 DIAGNOSIS — Z79899 Other long term (current) drug therapy: Secondary | ICD-10-CM | POA: Insufficient documentation

## 2011-05-11 DIAGNOSIS — R112 Nausea with vomiting, unspecified: Secondary | ICD-10-CM | POA: Insufficient documentation

## 2011-05-11 LAB — URINALYSIS, ROUTINE W REFLEX MICROSCOPIC
Glucose, UA: NEGATIVE
Glucose, UA: NEGATIVE mg/dL
Ketones, ur: NEGATIVE
Ketones, ur: NEGATIVE mg/dL
Protein, ur: NEGATIVE
Protein, ur: NEGATIVE mg/dL
Urobilinogen, UA: 0.2 mg/dL (ref 0.0–1.0)

## 2011-05-11 LAB — HEPATIC FUNCTION PANEL
ALT: 16 U/L (ref 0–35)
AST: 13 U/L (ref 0–37)
Alkaline Phosphatase: 108 U/L (ref 39–117)
Bilirubin, Direct: 0.1 mg/dL (ref 0.0–0.3)
Total Bilirubin: 0.1 mg/dL — ABNORMAL LOW (ref 0.3–1.2)

## 2011-05-11 LAB — COMPREHENSIVE METABOLIC PANEL
ALT: 18
BUN: 11
CO2: 26
Calcium: 8.6
Creatinine, Ser: 1.4 — ABNORMAL HIGH
GFR calc non Af Amer: 44 — ABNORMAL LOW
Glucose, Bld: 80
Sodium: 134 — ABNORMAL LOW
Total Protein: 6.9

## 2011-05-11 LAB — CBC
HCT: 37.3
HCT: 39.6 % (ref 36.0–46.0)
Hemoglobin: 12.7
Hemoglobin: 13.2 g/dL (ref 12.0–15.0)
MCH: 30.8 pg (ref 26.0–34.0)
MCHC: 34.1
MCHC: 33.3 g/dL (ref 30.0–36.0)
MCV: 93.4
RBC: 4
RDW: 14.2
RDW: 14.8 % (ref 11.5–15.5)

## 2011-05-11 LAB — DIFFERENTIAL
Basophils Absolute: 0.1 10*3/uL (ref 0.0–0.1)
Basophils Relative: 1
Basophils Relative: 1 % (ref 0–1)
Eosinophils Absolute: 0.4
Eosinophils Absolute: 0.4 10*3/uL (ref 0.0–0.7)
Eosinophils Relative: 3 % (ref 0–5)
Lymphs Abs: 2.7
Monocytes Absolute: 0.8 10*3/uL (ref 0.1–1.0)
Monocytes Relative: 5
Monocytes Relative: 6 % (ref 3–12)
Neutro Abs: 5
Neutrophils Relative %: 58

## 2011-05-11 LAB — GC/CHLAMYDIA PROBE AMP, GENITAL
Chlamydia, DNA Probe: NEGATIVE
GC Probe Amp, Genital: NEGATIVE

## 2011-05-11 LAB — POCT PREGNANCY, URINE: Preg Test, Ur: NEGATIVE

## 2011-05-11 LAB — BASIC METABOLIC PANEL
BUN: 11 mg/dL (ref 6–23)
Calcium: 9.2 mg/dL (ref 8.4–10.5)
Creatinine, Ser: 0.98 mg/dL (ref 0.50–1.10)
GFR calc Af Amer: 87 mL/min — ABNORMAL LOW (ref >90)
GFR calc non Af Amer: 75 mL/min — ABNORMAL LOW (ref >90)

## 2011-05-11 LAB — LIPASE, BLOOD: Lipase: 14

## 2011-05-11 LAB — URINE MICROSCOPIC-ADD ON

## 2011-05-11 LAB — WET PREP, GENITAL: Clue Cells Wet Prep HPF POC: NONE SEEN

## 2011-05-11 MED ORDER — GLYCOPYRROLATE 0.2 MG/ML IJ SOLN
0.2000 mg | Freq: Once | INTRAMUSCULAR | Status: AC
  Administered 2011-05-11: 0.2 mg via INTRAVENOUS
  Filled 2011-05-11: qty 1

## 2011-05-11 MED ORDER — HYDROMORPHONE HCL 1 MG/ML IJ SOLN
1.0000 mg | Freq: Once | INTRAMUSCULAR | Status: AC
  Administered 2011-05-11: 1 mg via INTRAVENOUS
  Filled 2011-05-11: qty 1

## 2011-05-11 MED ORDER — HYDROCODONE-ACETAMINOPHEN 5-325 MG PO TABS: 1.0000 | ORAL_TABLET | Freq: Four times a day (QID) | ORAL | Status: AC | PRN

## 2011-05-11 MED ORDER — ONDANSETRON HCL 4 MG/2ML IJ SOLN
4.0000 mg | Freq: Once | INTRAMUSCULAR | Status: AC
  Administered 2011-05-11: 4 mg via INTRAVENOUS
  Filled 2011-05-11: qty 2

## 2011-05-11 MED ORDER — SODIUM CHLORIDE 0.9 % IV SOLN
INTRAVENOUS | Status: DC
  Administered 2011-05-11: 16:00:00 via INTRAVENOUS

## 2011-05-11 NOTE — ED Notes (Signed)
Pt c/o upper abd pain since yesterday with n/v.

## 2011-05-11 NOTE — ED Notes (Signed)
Pt had a normal [place].  She is to follow up with her md and increase her prilosec  Benny Lennert, MD 05/11/11 1651

## 2011-05-11 NOTE — ED Provider Notes (Signed)
History   Chart scribed for Robin Stairs, MD by Enos Fling; the patient was seen in room APA08/APA08; this patient's care was started at 2:52 PM.    CSN: [id number] Arrival date & time: 05/11/2011  1:54 PM  Chief Complaint  Patient presents with  . Abdominal Pain  . Nausea  . Emesis    HPI Robin Frey is a 35 y.o. female who presents to the Emergency Department complaining of abdominal pain. Pt reports fever and intermittent n/v onset 2 days ago with associated RUQ abdominal pain onset yesterday. Pain is worse today, non-radiating, and aggravated by eating. Reports vomiting today after po intake. Still c/o nausea and abd pain currently. Pt has been taking motrin with transient relief of fever and no relief of pain. No diarrhea, urinary complaints, dizziness, headache, cough, congestion, cp, or sob. Pt uses 3 L home O2 at night. Pt current smoker, 0.5-1 ppd; denies drinking or other drug use.  PCP [facility]  History reviewed. No pertinent past medical history.  Past Surgical History  Procedure Date  . Cardiac surgery   . Tubal ligation   . 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 No pertinent family history.  History  Substance Use Topics  . Smoking status: Current Everyday Smoker    Types: Cigarettes  . Smokeless tobacco: Not on file  . Alcohol Use: No    OB History    Grav Para Term Preterm Abortions TAB SAB Ect Mult Living                  Review of Systems 10 Systems reviewed and are negative for acute change except as noted in the HPI.  Allergies  Keflex; Morphine and related; Trazodone and nefazodone; and Toradol  Home Medications   Current Outpatient Rx  Name Route Sig Dispense Refill  . ALBUTEROL SULFATE HFA 108 (90 BASE) MCG/ACT IN AERS Inhalation Inhale 2 puffs into the lungs every 6 (six) hours as needed. Asthma     . BECLOMETHASONE DIPROPIONATE 40 MCG/ACT IN AERS Inhalation  Inhale 2 puffs into the lungs 2 (two) times daily.      Marland Kitchen GABAPENTIN 100 MG PO CAPS Oral Take 100 mg by mouth at bedtime.      . IBUPROFEN 200 MG PO TABS Oral Take 600-800 mg by mouth every 6 (six) hours as needed. Pain     . OMEPRAZOLE 20 MG PO CPDR Oral Take 20 mg by mouth 2 (two) times daily.        BP 118/71  Pulse 90  Temp(Src) 99 F (37.2 C) (Oral)  Resp 20  Ht 5\' 7"  (1.702 m)  Wt 270 lb (122.471 kg)  BMI 42.29 kg/m2  SpO2 99%  LMP 05/07/2011  Physical Exam  Nursing note and vitals reviewed. Constitutional: She is oriented to person, place, and time. She appears well-developed and well-nourished. No distress.  HENT:  Head: Normocephalic.  Mouth/Throat: Mucous membranes are normal.  Eyes: Conjunctivae are normal.  Neck: Normal range of motion. Neck supple.  Cardiovascular: Normal rate, regular rhythm and intact distal pulses.  Exam reveals no gallop and no friction rub.   No murmur heard. Pulmonary/Chest: Effort normal and breath sounds normal. She has no wheezes. She has no rales.  Abdominal: Soft. There is tenderness. There is positive Murphy's sign.       Decreased bowel sounds  Musculoskeletal: Normal range of motion.  Neurological: She is  alert and oriented to person, place, and time.  Skin: Skin is warm and dry. No rash noted.  Psychiatric: She has a normal mood and affect.    ED Course  Procedures - none  Labs Reviewed  URINALYSIS, ROUTINE W REFLEX MICROSCOPIC - Abnormal; Notable for the following:    Hgb urine dipstick TRACE (*)    All other components within normal limits  URINE MICROSCOPIC-ADD ON - Abnormal; Notable for the following:    Squamous Epithelial / LPF FEW (*)    All other components within normal limits  PREGNANCY, URINE  CBC  DIFFERENTIAL  BASIC METABOLIC PANEL  LIPASE, BLOOD   No results found.  OTHER DATA REVIEWED: Nursing notes and vital signs reviewed. Prior records reviewed.  All results reviewed and discussed, questions  answered, pt agreeable with plan.    MDM  Obese female with the complaint of abdominal pain with nausea, vomiting, fever.  The pain is in her right upper quadrant and radiates to her back.  She got tenderness in her upper quadrant and Murphy sign.  I suspect that she got gallstones or cholecystitis.  We will do blood tests and ultrasound and treat her symptoms in the emergency department   SCRIBE ATTESTATION: I personally performed the services described in this documentation, which was scribed in my presence. The recorded information has been reviewed and considered. Robin Stairs, MD        Robin Stairs, MD 05/13/11 [id number]

## 2011-08-19 ENCOUNTER — Emergency Department (HOSPITAL_BASED_OUTPATIENT_CLINIC_OR_DEPARTMENT_OTHER)
Admission: EM | Admit: 2011-08-19 | Discharge: 2011-08-20 | Disposition: A | Discharge status: Home / Self Care | Payer: Medicaid Other | Attending: Emergency Medicine | Admitting: Emergency Medicine

## 2011-08-19 ENCOUNTER — Encounter (HOSPITAL_BASED_OUTPATIENT_CLINIC_OR_DEPARTMENT_OTHER): Payer: Self-pay | Admitting: *Deleted

## 2011-08-19 DIAGNOSIS — J111 Influenza due to unidentified influenza virus with other respiratory manifestations: Secondary | ICD-10-CM | POA: Insufficient documentation

## 2011-08-19 DIAGNOSIS — J45909 Unspecified asthma, uncomplicated: Secondary | ICD-10-CM | POA: Insufficient documentation

## 2011-08-19 DIAGNOSIS — K219 Gastro-esophageal reflux disease without esophagitis: Secondary | ICD-10-CM | POA: Insufficient documentation

## 2011-08-19 DIAGNOSIS — Z79899 Other long term (current) drug therapy: Secondary | ICD-10-CM | POA: Insufficient documentation

## 2011-08-19 DIAGNOSIS — F319 Bipolar disorder, unspecified: Secondary | ICD-10-CM | POA: Insufficient documentation

## 2011-08-19 DIAGNOSIS — R112 Nausea with vomiting, unspecified: Secondary | ICD-10-CM | POA: Insufficient documentation

## 2011-08-19 DIAGNOSIS — G4730 Sleep apnea, unspecified: Secondary | ICD-10-CM | POA: Insufficient documentation

## 2011-08-19 HISTORY — DX: Polyneuropathy, unspecified: G62.9

## 2011-08-19 HISTORY — DX: Bipolar disorder, unspecified: F31.9

## 2011-08-19 HISTORY — DX: Gastro-esophageal reflux disease without esophagitis: K21.9

## 2011-08-19 HISTORY — DX: Major depressive disorder, single episode, unspecified: F32.9

## 2011-08-19 HISTORY — DX: Depression, unspecified: F32.A

## 2011-08-19 HISTORY — DX: Sleep apnea, unspecified: G47.30

## 2011-08-19 MED ORDER — ONDANSETRON HCL 4 MG/2ML IJ SOLN
4.0000 mg | Freq: Once | INTRAMUSCULAR | Status: AC
  Administered 2011-08-20: 4 mg via INTRAVENOUS
  Filled 2011-08-19: qty 2

## 2011-08-19 MED ORDER — SODIUM CHLORIDE 0.9 % IV BOLUS (SEPSIS)
1000.0000 mL | Freq: Once | INTRAVENOUS | Status: AC
  Administered 2011-08-20: 1000 mL via INTRAVENOUS

## 2011-08-19 MED ORDER — SODIUM CHLORIDE 0.9 % IV SOLN: INTRAVENOUS | Status: DC

## 2011-08-19 NOTE — ED Notes (Signed)
Dx with pneumonia at [facility] hosp 2 days ago-c/o increased SOB, body aches

## 2011-08-20 ENCOUNTER — Emergency Department (INDEPENDENT_AMBULATORY_CARE_PROVIDER_SITE_OTHER): Payer: Medicaid Other

## 2011-08-20 DIAGNOSIS — R52 Pain, unspecified: Secondary | ICD-10-CM

## 2011-08-20 DIAGNOSIS — R509 Fever, unspecified: Secondary | ICD-10-CM

## 2011-08-20 DIAGNOSIS — R05 Cough: Secondary | ICD-10-CM

## 2011-08-20 DIAGNOSIS — R918 Other nonspecific abnormal finding of lung field: Secondary | ICD-10-CM

## 2011-08-20 LAB — CBC
MCH: 30.1 pg (ref 26.0–34.0)
MCHC: 34 g/dL (ref 30.0–36.0)
Platelets: 324 10*3/uL (ref 150–400)
RBC: 3.86 MIL/uL — ABNORMAL LOW (ref 3.87–5.11)
RDW: 14.6 % (ref 11.5–15.5)

## 2011-08-20 LAB — COMPREHENSIVE METABOLIC PANEL
ALT: 19 U/L (ref 0–35)
AST: 15 U/L (ref 0–37)
Albumin: 3.1 g/dL — ABNORMAL LOW (ref 3.5–5.2)
Calcium: 9.1 mg/dL (ref 8.4–10.5)
Sodium: 135 mEq/L (ref 135–145)
Total Protein: 7.1 g/dL (ref 6.0–8.3)

## 2011-08-20 MED ORDER — HYDROMORPHONE HCL PF 1 MG/ML IJ SOLN
1.0000 mg | Freq: Once | INTRAMUSCULAR | Status: AC
  Administered 2011-08-20: 1 mg via INTRAVENOUS
  Filled 2011-08-20: qty 1

## 2011-08-20 MED ORDER — ONDANSETRON HCL 4 MG/2ML IJ SOLN
4.0000 mg | Freq: Once | INTRAMUSCULAR | Status: AC
  Administered 2011-08-20: 4 mg via INTRAVENOUS
  Filled 2011-08-20: qty 2

## 2011-08-20 MED ORDER — PROMETHAZINE HCL 25 MG PO TABS: 25.0000 mg | ORAL_TABLET | Freq: Four times a day (QID) | ORAL | Status: AC | PRN

## 2011-08-20 MED ORDER — SODIUM CHLORIDE 0.9 % IV BOLUS (SEPSIS): 1000.0000 mL | Freq: Once | INTRAVENOUS | Status: DC

## 2011-08-20 MED ORDER — HYDROMORPHONE HCL 2 MG PO TABS: 2.0000 mg | ORAL_TABLET | ORAL | Status: AC | PRN

## 2011-08-20 NOTE — ED Provider Notes (Signed)
History     CSN: [id number]  Arrival date & time 08/19/11  2020   First MD Initiated Contact with Patient 08/19/11 2311      Chief Complaint  Patient presents with  . Pneumonia    (Consider location/radiation/quality/duration/timing/severity/associated sxs/prior treatment) Patient is a 36 y.o. female presenting with pneumonia. The history is provided by the patient.  Pneumonia This is a new problem. The current episode started 2 days ago. The problem occurs constantly. The problem has been gradually worsening. Associated symptoms include abdominal pain and shortness of breath. Pertinent negatives include no chest pain and no headaches. The symptoms are aggravated by nothing. The symptoms are relieved by nothing.   Patient seen at [facility] on Wednesday for right upper quadrant pain that radiated to the back workup of noted elevated white blood cell count chest x-ray showed a pneumonia. Patient was given IV antibiotics in the emergency department and then sent home on Levaquin starting one day later patient started with cough congestion bodyaches vomiting 4-5 times no diarrhea that has continued until today.   Past Medical History  Diagnosis Date  . Neuropathy   . Bipolar disorder   . GERD (gastroesophageal reflux disease)   . Depression   . Asthma   . Sleep apnea     Past Surgical History  Procedure Date  . Cardiac surgery   . Tubal ligation   . Appendectomy     No family history on file.  History  Substance Use Topics  . Smoking status: Current Everyday Smoker -- 0.5 packs/day    Types: Cigarettes  . Smokeless tobacco: Not on file  . Alcohol Use: No    OB History    Grav Para Term Preterm Abortions TAB SAB Ect Mult Living                  Review of Systems  Constitutional: Positive for fever and chills.  HENT: Positive for congestion and sore throat. Negative for neck pain and neck stiffness.   Eyes: Negative for pain.  Respiratory: Positive for  cough and shortness of breath.   Cardiovascular: Negative for chest pain.  Gastrointestinal: Positive for nausea, vomiting and abdominal pain. Negative for diarrhea.  Genitourinary: Negative for dysuria.  Musculoskeletal: Positive for myalgias. Negative for back pain.  Skin: Negative for rash.  Neurological: Negative for headaches.  Hematological: Does not bruise/bleed easily.    Allergies  Keflex; Morphine and related; Trazodone and nefazodone; and Toradol  Home Medications   Current Outpatient Rx  Name Route Sig Dispense Refill  . ALBUTEROL SULFATE HFA 108 (90 BASE) MCG/ACT IN AERS Inhalation Inhale 2 puffs into the lungs every 6 (six) hours as needed. Asthma     . BECLOMETHASONE DIPROPIONATE 40 MCG/ACT IN AERS Inhalation Inhale 2 puffs into the lungs 2 (two) times daily.      . BUPROPION HCL ER (XL) 300 MG PO TB24 Oral Take 300 mg by mouth daily.    Marland Kitchen GABAPENTIN 600 MG PO TABS Oral Take 600 mg by mouth 3 (three) times daily.    Marland Kitchen HYDROCODONE-ACETAMINOPHEN 5-500 MG PO TABS Oral Take 1 tablet by mouth every 6 (six) hours as needed. For pain    . HYDROXYZINE PAMOATE 100 MG PO CAPS Oral Take 100 mg by mouth 2 (two) times daily.    Marland Kitchen LEVOFLOXACIN 500 MG PO TABS Oral Take 500 mg by mouth daily.    Marland Kitchen NAPROXEN SODIUM 220 MG PO TABS Oral Take 220 mg by mouth every  6 (six) hours as needed. For fever or pain    . OMEPRAZOLE 20 MG PO CPDR Oral Take 20 mg by mouth 2 (two) times daily.      Marland Kitchen ONDANSETRON HCL 4 MG PO TABS Oral Take 4 mg by mouth every 8 (eight) hours as needed. For nausea    . QUETIAPINE FUMARATE 400 MG PO TABS Oral Take 400 mg by mouth at bedtime.    Marland Kitchen HYDROMORPHONE HCL 2 MG PO TABS Oral Take 1 tablet (2 mg total) by mouth every 4 (four) hours as needed for pain. 20 tablet 0  . PROMETHAZINE HCL 25 MG PO TABS Oral Take 1 tablet (25 mg total) by mouth every 6 (six) hours as needed for nausea. 12 tablet 0    BP 99/48  Pulse 96  Temp(Src) 98.5 F (36.9 C) (Oral)  Resp 20  Ht 5'  8" (1.727 m)  Wt 280 lb (127.007 kg)  BMI 42.57 kg/m2  SpO2 90%  LMP 08/01/2011  Physical Exam  Nursing note and vitals reviewed. Constitutional: She is oriented to person, place, and time. She appears well-developed.  HENT:  Head: Normocephalic and atraumatic.  Mouth/Throat: Oropharynx is clear and moist.  Eyes: Conjunctivae and EOM are normal. Pupils are equal, round, and reactive to light.  Neck: Normal range of motion. Neck supple.  Cardiovascular: Normal rate, regular rhythm, normal heart sounds and intact distal pulses.   No murmur heard. Pulmonary/Chest: Effort normal and breath sounds normal. No respiratory distress. She has no wheezes. She has no rales.  Abdominal: Soft. Bowel sounds are normal. There is no tenderness.  Musculoskeletal: Normal range of motion.  Lymphadenopathy:    She has no cervical adenopathy.  Neurological: She is alert and oriented to person, place, and time. No cranial nerve deficit. She exhibits normal muscle tone. Coordination normal.  Skin: Skin is warm. No rash noted.    ED Course  Procedures (including critical care time)  Labs Reviewed  CBC - Abnormal; Notable for the following:    WBC 20.9 (*)    RBC 3.86 (*)    Hemoglobin 11.6 (*)    HCT 34.1 (*)    All other components within normal limits  COMPREHENSIVE METABOLIC PANEL - Abnormal; Notable for the following:    Glucose, Bld 108 (*)    Albumin 3.1 (*)    GFR calc non Af Amer 65 (*)    GFR calc Af Amer 76 (*)    All other components within normal limits  LIPASE, BLOOD - Abnormal; Notable for the following:    Lipase 10 (*)    All other components within normal limits   Dg Chest 2 View  08/20/2011  *RADIOLOGY REPORT*  Clinical Data: Progressively worsening cough and fever; body aches. History of smoking.  CHEST - 2 VIEW  Comparison: Chest radiograph performed 09/29/2009  Findings: The lungs are well-aerated.  Minimal left midlung opacity could reflect mild pneumonia.  There is no  evidence of pleural effusion or pneumothorax.  The heart is normal in size; the mediastinal contour is within normal limits.  No acute osseous abnormalities are seen.  IMPRESSION: Minimal left midlung airspace opacity could reflect mild pneumonia.  Original Report Authenticated By: Tonia Ghent, M.D.   Results for orders placed during the hospital encounter of 08/19/11  CBC      Component Value Range   WBC 20.9 (*) 4.0 - 10.5 (K/uL)   RBC 3.86 (*) 3.87 - 5.11 (MIL/uL)   Hemoglobin 11.6 (*) 12.0 -  15.0 (g/dL)   HCT 96.0 (*) 45.4 - 46.0 (%)   MCV 88.3  78.0 - 100.0 (fL)   MCH 30.1  26.0 - 34.0 (pg)   MCHC 34.0  30.0 - 36.0 (g/dL)   RDW 09.8  11.9 - 14.7 (%)   Platelets 324  150 - 400 (K/uL)  COMPREHENSIVE METABOLIC PANEL      Component Value Range   Sodium 135  135 - 145 (mEq/L)   Potassium 4.1  3.5 - 5.1 (mEq/L)   Chloride 102  96 - 112 (mEq/L)   CO2 24  19 - 32 (mEq/L)   Glucose, Bld 108 (*) 70 - 99 (mg/dL)   BUN 12  6 - 23 (mg/dL)   Creatinine, Ser 8.29  0.50 - 1.10 (mg/dL)   Calcium 9.1  8.4 - 56.2 (mg/dL)   Total Protein 7.1  6.0 - 8.3 (g/dL)   Albumin 3.1 (*) 3.5 - 5.2 (g/dL)   AST 15  0 - 37 (U/L)   ALT 19  0 - 35 (U/L)   Alkaline Phosphatase 76  39 - 117 (U/L)   Total Bilirubin 0.3  0.3 - 1.2 (mg/dL)   GFR calc non Af Amer 65 (*) >90 (mL/min)   GFR calc Af Amer 76 (*) >90 (mL/min)  LIPASE, BLOOD      Component Value Range   Lipase 10 (*) 11 - 59 (U/L)      1. Influenza       MDM   Today's symptoms mostly consistent with influenza due to the bodyaches vomiting 4 or 5 times without diarrhea, chest x-ray only with a questionable left left-sided pneumonia already being treated with Levaquin from the visit at [facility] 2 days ago. Marked leukocytosis otherwise labs without significant findings, since the visit at [facility] patient's had increased symptoms of coughing congestion and body aches as noted in the vomit. LFTs without significant findings  electrolytes were significant abnormalities low-grade fever in the emergency department but less than 101.   The patient improved significantly and emergent form of fluids and IV pain medicines and antinausea medicine at discharge patient is alert eating food nontoxic in no acute distress.        Shelda Jakes, MD 08/20/11 [phone]

## 2011-08-31 ENCOUNTER — Emergency Department (HOSPITAL_BASED_OUTPATIENT_CLINIC_OR_DEPARTMENT_OTHER)
Admission: EM | Admit: 2011-08-31 | Discharge: 2011-08-31 | Disposition: A | Discharge status: Home / Self Care | Payer: Medicaid Other | Attending: Emergency Medicine | Admitting: Emergency Medicine

## 2011-08-31 ENCOUNTER — Encounter (HOSPITAL_BASED_OUTPATIENT_CLINIC_OR_DEPARTMENT_OTHER): Payer: Self-pay | Admitting: *Deleted

## 2011-08-31 DIAGNOSIS — Z79899 Other long term (current) drug therapy: Secondary | ICD-10-CM | POA: Insufficient documentation

## 2011-08-31 DIAGNOSIS — R109 Unspecified abdominal pain: Secondary | ICD-10-CM

## 2011-08-31 DIAGNOSIS — R1011 Right upper quadrant pain: Secondary | ICD-10-CM | POA: Insufficient documentation

## 2011-08-31 DIAGNOSIS — F319 Bipolar disorder, unspecified: Secondary | ICD-10-CM | POA: Insufficient documentation

## 2011-08-31 DIAGNOSIS — G4730 Sleep apnea, unspecified: Secondary | ICD-10-CM | POA: Insufficient documentation

## 2011-08-31 DIAGNOSIS — K219 Gastro-esophageal reflux disease without esophagitis: Secondary | ICD-10-CM | POA: Insufficient documentation

## 2011-08-31 DIAGNOSIS — J45909 Unspecified asthma, uncomplicated: Secondary | ICD-10-CM | POA: Insufficient documentation

## 2011-08-31 LAB — URINALYSIS, ROUTINE W REFLEX MICROSCOPIC
Glucose, UA: NEGATIVE mg/dL
Leukocytes, UA: NEGATIVE
Specific Gravity, Urine: 1.014 (ref 1.005–1.030)

## 2011-08-31 LAB — COMPREHENSIVE METABOLIC PANEL
ALT: 34 U/L (ref 0–35)
AST: 26 U/L (ref 0–37)
Albumin: 3.7 g/dL (ref 3.5–5.2)
Alkaline Phosphatase: 84 U/L (ref 39–117)
Calcium: 9.1 mg/dL (ref 8.4–10.5)
GFR calc Af Amer: 68 mL/min — ABNORMAL LOW (ref >90)
Glucose, Bld: 79 mg/dL (ref 70–99)
Potassium: 3.7 mEq/L (ref 3.5–5.1)
Sodium: 137 mEq/L (ref 135–145)
Total Protein: 7.6 g/dL (ref 6.0–8.3)

## 2011-08-31 LAB — CBC
MCH: 30 pg (ref 26.0–34.0)
Platelets: 483 10*3/uL — ABNORMAL HIGH (ref 150–400)
RBC: 4.04 MIL/uL (ref 3.87–5.11)
RDW: 14.5 % (ref 11.5–15.5)
WBC: 15.1 10*3/uL — ABNORMAL HIGH (ref 4.0–10.5)

## 2011-08-31 LAB — DIFFERENTIAL
Basophils Absolute: 0.1 10*3/uL (ref 0.0–0.1)
Eosinophils Absolute: 0.4 10*3/uL (ref 0.0–0.7)
Lymphs Abs: 4.3 10*3/uL — ABNORMAL HIGH (ref 0.7–4.0)
Neutrophils Relative %: 63 % (ref 43–77)

## 2011-08-31 LAB — URINE MICROSCOPIC-ADD ON

## 2011-08-31 MED ORDER — ONDANSETRON HCL 4 MG/2ML IJ SOLN: 4.0000 mg | Freq: Once | INTRAMUSCULAR | Status: DC

## 2011-08-31 MED ORDER — HYDROMORPHONE HCL PF 1 MG/ML IJ SOLN
1.0000 mg | Freq: Once | INTRAMUSCULAR | Status: DC
  Filled 2011-08-31: qty 1

## 2011-08-31 MED ORDER — ONDANSETRON 4 MG PO TBDP: 4.0000 mg | ORAL_TABLET | Freq: Once | ORAL | Status: AC

## 2011-08-31 MED ORDER — SODIUM CHLORIDE 0.9 % IV BOLUS (SEPSIS): 1000.0000 mL | Freq: Once | INTRAVENOUS | Status: DC

## 2011-08-31 MED ORDER — ONDANSETRON 4 MG PO TBDP
4.0000 mg | ORAL_TABLET | Freq: Once | ORAL | Status: DC
  Filled 2011-08-31: qty 1

## 2011-08-31 MED ORDER — HYDROMORPHONE HCL PF 1 MG/ML IJ SOLN: 1.0000 mg | Freq: Once | INTRAMUSCULAR | Status: DC

## 2011-08-31 NOTE — ED Notes (Signed)
Pt c/o lower abd pain  X 2 days

## 2011-08-31 NOTE — ED Provider Notes (Signed)
History     CSN: [id number]  Arrival date & time 08/31/11  1478   First MD Initiated Contact with Patient 08/31/11 1900      Chief Complaint  Patient presents with  . Abdominal Pain   Recently seen in the ER on January 11 for influenza and pneumonia. Before that visit. She is oriented and seen at [facility] and was placed on Levaquin. During that visit. She was given fluids and pain medication and was discharged home.  Today she complains of pain in the right upper quadrant. She also has had some vomiting earlier. Patient did have an ultrasound back in October, which was essentially normal. She also does have chronic pain and states she lost family members recently. States she's been under lot of stress. She's had no fevers, no diarrhea. (Consider location/radiation/quality/duration/timing/severity/associated sxs/prior treatment) HPI  Past Medical History  Diagnosis Date  . Neuropathy   . Bipolar disorder   . GERD (gastroesophageal reflux disease)   . Depression   . Asthma   . Sleep apnea     Past Surgical History  Procedure Date  . Cardiac surgery   . Tubal ligation   . Appendectomy     History reviewed. No pertinent family history.  History  Substance Use Topics  . Smoking status: Current Everyday Smoker -- 0.5 packs/day    Types: Cigarettes  . Smokeless tobacco: Not on file  . Alcohol Use: No    OB History    Grav Para Term Preterm Abortions TAB SAB Ect Mult Living                  Review of Systems  All other systems reviewed and are negative.    Allergies  Keflex; Morphine and related; Trazodone and nefazodone; and Toradol  Home Medications   Current Outpatient Rx  Name Route Sig Dispense Refill  . ALBUTEROL SULFATE HFA 108 (90 BASE) MCG/ACT IN AERS Inhalation Inhale 2 puffs into the lungs every 6 (six) hours as needed. Asthma     . BECLOMETHASONE DIPROPIONATE 40 MCG/ACT IN AERS Inhalation Inhale 2 puffs into the lungs 2 (two) times daily.       . BUPROPION HCL ER (XL) 300 MG PO TB24 Oral Take 300 mg by mouth daily.    Marland Kitchen GABAPENTIN 600 MG PO TABS Oral Take 600 mg by mouth 3 (three) times daily.    Marland Kitchen HYDROXYZINE PAMOATE 100 MG PO CAPS Oral Take 100 mg by mouth 2 (two) times daily.    Marland Kitchen NAPROXEN SODIUM 220 MG PO TABS Oral Take 440 mg by mouth every 6 (six) hours as needed. For fever or pain    . OMEPRAZOLE 20 MG PO CPDR Oral Take 20 mg by mouth 2 (two) times daily.      Marland Kitchen ONDANSETRON HCL 4 MG PO TABS Oral Take 4 mg by mouth every 8 (eight) hours as needed. For nausea    . QUETIAPINE FUMARATE 400 MG PO TABS Oral Take 400 mg by mouth at bedtime.    Marland Kitchen HYDROMORPHONE HCL 2 MG PO TABS Oral Take 1 tablet (2 mg total) by mouth every 4 (four) hours as needed for pain. 20 tablet 0    BP 132/77  Pulse 82  Temp 98.2 F (36.8 C)  Resp 18  Ht 5\' 6"  (1.676 m)  Wt 260 lb (117.935 kg)  BMI 41.97 kg/m2  SpO2 100%  LMP 08/01/2011  Physical Exam  Nursing note and vitals reviewed. Constitutional: She is oriented to  person, place, and time. She appears well-developed and well-nourished.  HENT:  Head: Normocephalic and atraumatic.  Eyes: Conjunctivae and EOM are normal. Pupils are equal, round, and reactive to light.  Neck: Neck supple.  Cardiovascular: Normal rate and regular rhythm.  Exam reveals no gallop and no friction rub.   No murmur heard. Pulmonary/Chest: Breath sounds normal. She has no wheezes. She has no rales. She exhibits no tenderness.  Abdominal: Soft. Bowel sounds are normal. She exhibits no distension. There is tenderness. There is no rebound and no guarding.       Minimal tenderness to palpation in the right upper quadrant to deep palpation only. No rebound, rigidity or guarding  Musculoskeletal: Normal range of motion.  Neurological: She is alert and oriented to person, place, and time. No cranial nerve deficit. Coordination normal.  Skin: Skin is warm and dry. No rash noted.  Psychiatric: She has a normal mood and affect.     ED Course  Procedures (including critical care time)   Labs Reviewed  URINALYSIS, ROUTINE W REFLEX MICROSCOPIC   No results found.   No diagnosis found.    MDM  Pt is seen and examined;  Initial history and physical completed.  Will follow.    Results for orders placed during the hospital encounter of 08/31/11  URINALYSIS, ROUTINE W REFLEX MICROSCOPIC      Component Value Range   Color, Urine YELLOW  YELLOW    APPearance CLEAR  CLEAR    Specific Gravity, Urine 1.014  1.005 - 1.030    pH 6.5  5.0 - 8.0    Glucose, UA NEGATIVE  NEGATIVE (mg/dL)   Hgb urine dipstick SMALL (*) NEGATIVE    Bilirubin Urine NEGATIVE  NEGATIVE    Ketones, ur NEGATIVE  NEGATIVE (mg/dL)   Protein, ur NEGATIVE  NEGATIVE (mg/dL)   Urobilinogen, UA 0.2  0.0 - 1.0 (mg/dL)   Nitrite NEGATIVE  NEGATIVE    Leukocytes, UA NEGATIVE  NEGATIVE   URINE MICROSCOPIC-ADD ON      Component Value Range   Squamous Epithelial / LPF RARE  RARE    RBC / HPF 3-6  <3 (RBC/hpf)   Bacteria, UA RARE  RARE   CBC      Component Value Range   WBC 15.1 (*) 4.0 - 10.5 (K/uL)   RBC 4.04  3.87 - 5.11 (MIL/uL)   Hemoglobin 12.1  12.0 - 15.0 (g/dL)   HCT 09.8 (*) 11.9 - 46.0 (%)   MCV 87.6  78.0 - 100.0 (fL)   MCH 30.0  26.0 - 34.0 (pg)   MCHC 34.2  30.0 - 36.0 (g/dL)   RDW 14.7  82.9 - 56.2 (%)   Platelets 483 (*) 150 - 400 (K/uL)  DIFFERENTIAL      Component Value Range   Neutrophils Relative 63  43 - 77 (%)   Neutro Abs 9.5 (*) 1.7 - 7.7 (K/uL)   Lymphocytes Relative 29  12 - 46 (%)   Lymphs Abs 4.3 (*) 0.7 - 4.0 (K/uL)   Monocytes Relative 5  3 - 12 (%)   Monocytes Absolute 0.8  0.1 - 1.0 (K/uL)   Eosinophils Relative 3  0 - 5 (%)   Eosinophils Absolute 0.4  0.0 - 0.7 (K/uL)   Basophils Relative 0  0 - 1 (%)   Basophils Absolute 0.1  0.0 - 0.1 (K/uL)  COMPREHENSIVE METABOLIC PANEL      Component Value Range   Sodium 137  135 - 145 (mEq/L)  Potassium 3.7  3.5 - 5.1 (mEq/L)   Chloride 101  96 - 112  (mEq/L)   CO2 26  19 - 32 (mEq/L)   Glucose, Bld 79  70 - 99 (mg/dL)   BUN 13  6 - 23 (mg/dL)   Creatinine, Ser 1.61 (*) 0.50 - 1.10 (mg/dL)   Calcium 9.1  8.4 - 09.6 (mg/dL)   Total Protein 7.6  6.0 - 8.3 (g/dL)   Albumin 3.7  3.5 - 5.2 (g/dL)   AST 26  0 - 37 (U/L)   ALT 34  0 - 35 (U/L)   Alkaline Phosphatase 84  39 - 117 (U/L)   Total Bilirubin 0.3  0.3 - 1.2 (mg/dL)   GFR calc non Af Amer 59 (*) >90 (mL/min)   GFR calc Af Amer 68 (*) >90 (mL/min)  LIPASE, BLOOD      Component Value Range   Lipase 23  11 - 59 (U/L)   Dg Chest 2 View  08/20/2011  *RADIOLOGY REPORT*  Clinical Data: Progressively worsening cough and fever; body aches. History of smoking.  CHEST - 2 VIEW  Comparison: Chest radiograph performed 09/29/2009  Findings: The lungs are well-aerated.  Minimal left midlung opacity could reflect mild pneumonia.  There is no evidence of pleural effusion or pneumothorax.  The heart is normal in size; the mediastinal contour is within normal limits.  No acute osseous abnormalities are seen.  IMPRESSION: Minimal left midlung airspace opacity could reflect mild pneumonia.  Original Report Authenticated By: Tonia Ghent, M.D.      Patient essentially refused IV fluids. She refused pain medication. Her liver function tests were normal. Lipase was normal. White count was slightly elevated, but was trended and has been 20.9, one week ago.   Patient has told the nurse, that she feels better and just wants to go. We will provide discharge instructions and encouraged close followup with her primary care doctor. Patient was encouraged to return to the for any concerns or changing symptoms   Josmar Messimer A. Patrica Duel, MD 08/31/11 2004

## 2011-08-31 NOTE — ED Notes (Addendum)
Pt not in room-EDP states he had d/c ready but did not get into room with results

## 2011-09-03 ENCOUNTER — Emergency Department (HOSPITAL_COMMUNITY)
Admission: EM | Admit: 2011-09-03 | Discharge: 2011-09-03 | Disposition: A | Discharge status: Home / Self Care | Payer: Medicaid Other | Attending: Emergency Medicine | Admitting: Emergency Medicine

## 2011-09-03 ENCOUNTER — Encounter (HOSPITAL_COMMUNITY): Payer: Self-pay

## 2011-09-03 ENCOUNTER — Emergency Department (HOSPITAL_COMMUNITY): Payer: Medicaid Other

## 2011-09-03 DIAGNOSIS — G4730 Sleep apnea, unspecified: Secondary | ICD-10-CM | POA: Insufficient documentation

## 2011-09-03 DIAGNOSIS — K219 Gastro-esophageal reflux disease without esophagitis: Secondary | ICD-10-CM | POA: Insufficient documentation

## 2011-09-03 DIAGNOSIS — R1011 Right upper quadrant pain: Secondary | ICD-10-CM | POA: Insufficient documentation

## 2011-09-03 DIAGNOSIS — R112 Nausea with vomiting, unspecified: Secondary | ICD-10-CM | POA: Insufficient documentation

## 2011-09-03 DIAGNOSIS — R109 Unspecified abdominal pain: Secondary | ICD-10-CM

## 2011-09-03 DIAGNOSIS — Z79899 Other long term (current) drug therapy: Secondary | ICD-10-CM | POA: Insufficient documentation

## 2011-09-03 DIAGNOSIS — F3130 Bipolar disorder, current episode depressed, mild or moderate severity, unspecified: Secondary | ICD-10-CM | POA: Insufficient documentation

## 2011-09-03 DIAGNOSIS — F17200 Nicotine dependence, unspecified, uncomplicated: Secondary | ICD-10-CM | POA: Insufficient documentation

## 2011-09-03 LAB — URINALYSIS, ROUTINE W REFLEX MICROSCOPIC
Bilirubin Urine: NEGATIVE
Ketones, ur: NEGATIVE mg/dL
Nitrite: NEGATIVE
Protein, ur: NEGATIVE mg/dL
Specific Gravity, Urine: <1.005 — ABNORMAL LOW (ref 1.005–1.030)
Urobilinogen, UA: 0.2 mg/dL (ref 0.0–1.0)

## 2011-09-03 LAB — DIFFERENTIAL
Basophils Relative: 1 % (ref 0–1)
Eosinophils Relative: 4 % (ref 0–5)
Monocytes Absolute: 0.6 10*3/uL (ref 0.1–1.0)
Monocytes Relative: 5 % (ref 3–12)
Neutrophils Relative %: 62 % (ref 43–77)

## 2011-09-03 LAB — COMPREHENSIVE METABOLIC PANEL
ALT: 33 U/L (ref 0–35)
Alkaline Phosphatase: 91 U/L (ref 39–117)
BUN: 9 mg/dL (ref 6–23)
CO2: 24 mEq/L (ref 19–32)
Chloride: 100 mEq/L (ref 96–112)
GFR calc Af Amer: 86 mL/min — ABNORMAL LOW (ref >90)
GFR calc non Af Amer: 74 mL/min — ABNORMAL LOW (ref >90)
Glucose, Bld: 85 mg/dL (ref 70–99)
Potassium: 4.1 mEq/L (ref 3.5–5.1)
Sodium: 135 mEq/L (ref 135–145)
Total Bilirubin: 0.3 mg/dL (ref 0.3–1.2)

## 2011-09-03 LAB — CBC
HCT: 39.3 % (ref 36.0–46.0)
Hemoglobin: 13.5 g/dL (ref 12.0–15.0)
MCH: 30.6 pg (ref 26.0–34.0)
MCHC: 34.4 g/dL (ref 30.0–36.0)
MCV: 89.1 fL (ref 78.0–100.0)
RBC: 4.41 MIL/uL (ref 3.87–5.11)

## 2011-09-03 LAB — URINE MICROSCOPIC-ADD ON

## 2011-09-03 LAB — LIPASE, BLOOD: Lipase: 17 U/L (ref 11–59)

## 2011-09-03 MED ORDER — HYDROMORPHONE HCL PF 1 MG/ML IJ SOLN
1.0000 mg | Freq: Once | INTRAMUSCULAR | Status: AC
  Administered 2011-09-03: 1 mg via INTRAVENOUS
  Filled 2011-09-03: qty 1

## 2011-09-03 MED ORDER — ONDANSETRON HCL 4 MG/2ML IJ SOLN
4.0000 mg | Freq: Once | INTRAMUSCULAR | Status: AC
  Administered 2011-09-03: 4 mg via INTRAVENOUS
  Filled 2011-09-03: qty 2

## 2011-09-03 MED ORDER — SODIUM CHLORIDE 0.9 % IV SOLN
Freq: Once | INTRAVENOUS | Status: AC
  Administered 2011-09-03: 1000 mL via INTRAVENOUS

## 2011-09-03 NOTE — ED Notes (Signed)
Patient given copy of lab results by EDP. Patient requesting copy of x-ray, x-ray called. Patient also requesting referral to surgeon closer then [facility]. EDP aware, calling [facility] for referral.

## 2011-09-03 NOTE — ED Notes (Signed)
Patient given CD of x-ray and referral for General Surgeon at [facility].

## 2011-09-03 NOTE — ED Notes (Signed)
Pt presents with RUQ pain and n/v since Thursday.

## 2011-09-03 NOTE — ED Provider Notes (Signed)
History     CSN: [id number]  Arrival date & time 09/03/11  1442   First MD Initiated Contact with Patient 09/03/11 1518      Chief Complaint  Patient presents with  . Abdominal Pain  . Nausea  . Emesis    (Consider location/radiation/quality/duration/timing/severity/associated sxs/prior treatment) HPI Comments: Patient is a 36 year old woman who complains of upper abdominal pain for 2 days.. Been seen at [facility] ED 2 days ago, and apparently left without a plan of treatment.  She complains of having nausea and dry heaves today. She took Zofran without relief. She is unaware of eating anything that caused her symptoms. She's had prior workup for upper abdominal pain, and had a negative ultrasound last fall. She says she is also had a HIDA scan which showed poor gallbladder function.  She was referred to a surgeon, but never went.  Patient is a 36 y.o. female presenting with abdominal pain and vomiting.  Abdominal Pain The primary symptoms of the illness include abdominal pain and vomiting. The primary symptoms of the illness do not include diarrhea. The current episode started 2 days ago. The onset of the illness was sudden. The problem has not changed since onset. The abdominal pain is located in the RUQ. The abdominal pain does not radiate. The severity of the abdominal pain is 8/10. The abdominal pain is relieved by nothing. The abdominal pain is exacerbated by vomiting.  The patient states that she believes she is currently not pregnant. The patient has not had a change in bowel habit. Additional symptoms associated with the illness include chills and anorexia.  Emesis  Associated symptoms include abdominal pain and chills. Pertinent negatives include no diarrhea.    Past Medical History  Diagnosis Date  . Neuropathy   . Bipolar disorder   . GERD (gastroesophageal reflux disease)   . Depression   . Asthma   . Sleep apnea     Past Surgical History  Procedure  Date  . Cardiac surgery   . Tubal ligation   . Appendectomy     No family history on file.  History  Substance Use Topics  . Smoking status: Current Everyday Smoker -- 0.5 packs/day    Types: Cigarettes  . Smokeless tobacco: Not on file  . Alcohol Use: No    OB History    Grav Para Term Preterm Abortions TAB SAB Ect Mult Living                  Review of Systems  Constitutional: Positive for chills.  HENT: Negative.   Eyes: Negative.   Respiratory: Negative.   Cardiovascular: Negative.   Gastrointestinal: Positive for vomiting, abdominal pain and anorexia. Negative for diarrhea.  Genitourinary: Negative.   Musculoskeletal: Negative.   Skin: Negative.   Neurological: Negative.   Psychiatric/Behavioral: Negative.     Allergies  Ciprofloxacin; Keflex; Morphine and related; Trazodone and nefazodone; and Toradol  Home Medications   Current Outpatient Rx  Name Route Sig Dispense Refill  . ALBUTEROL SULFATE HFA 108 (90 BASE) MCG/ACT IN AERS Inhalation Inhale 2 puffs into the lungs every 6 (six) hours as needed. Asthma     . BECLOMETHASONE DIPROPIONATE 40 MCG/ACT IN AERS Inhalation Inhale 2 puffs into the lungs 2 (two) times daily.      . BUPROPION HCL ER (XL) 300 MG PO TB24 Oral Take 300 mg by mouth daily.    Marland Kitchen GABAPENTIN 600 MG PO TABS Oral Take 600 mg by mouth 3 (  three) times daily.    Marland Kitchen HYDROXYZINE PAMOATE 100 MG PO CAPS Oral Take 100 mg by mouth 2 (two) times daily.    Marland Kitchen NAPROXEN SODIUM 220 MG PO TABS Oral Take 440 mg by mouth every 6 (six) hours as needed. For fever or pain    . OMEPRAZOLE 20 MG PO CPDR Oral Take 20 mg by mouth 2 (two) times daily.      Marland Kitchen ONDANSETRON HCL 4 MG PO TABS Oral Take 4 mg by mouth every 8 (eight) hours as needed. For nausea    . ONDANSETRON 4 MG PO TBDP Oral Take 1 tablet (4 mg total) by mouth once. 15 tablet 0  . QUETIAPINE FUMARATE 400 MG PO TABS Oral Take 400 mg by mouth at bedtime.      BP 107/67  Pulse 97  Temp(Src) 98.6 F (37  C) (Oral)  Resp 20  Ht 5\' 7"  (1.702 m)  Wt 280 lb (127.007 kg)  BMI 43.85 kg/m2  SpO2 98%  LMP 08/01/2011  Physical Exam  Constitutional: She is oriented to person, place, and time.       Morbidly obese woman in mild distress complaining of right upper quadrant pain.  HENT:  Head: Normocephalic and atraumatic.  Right Ear: External ear normal.  Left Ear: External ear normal.  Mouth/Throat: Oropharynx is clear and moist.  Eyes: Conjunctivae and EOM are normal. Pupils are equal, round, and reactive to light. No scleral icterus.  Neck: Normal range of motion. Neck supple.  Cardiovascular: Normal rate, regular rhythm and normal heart sounds.   Pulmonary/Chest: Effort normal and breath sounds normal.  Abdominal: Soft.       He has right upper quadrant tenderness but no mass rebound or rigidity.  Musculoskeletal: Normal range of motion.  Neurological: She is alert and oriented to person, place, and time.       Sensory or motor deficit.  Skin: Skin is warm and dry.  Psychiatric: She has a normal mood and affect. Her behavior is normal.    ED Course  Procedures (including critical care time)   Labs Reviewed  CBC  DIFFERENTIAL  COMPREHENSIVE METABOLIC PANEL  LIPASE, BLOOD  URINALYSIS, ROUTINE W REFLEX MICROSCOPIC  URINE CULTURE   3:42 PM And physical examination. IV medicines for pain and nausea was ordered.  Laboratory tests and acute abdominal x-ray series were ordered.  6:04 PM Results for orders placed during the hospital encounter of 09/03/11  CBC      Component Value Range   WBC 11.4 (*) 4.0 - 10.5 (K/uL)   RBC 4.41  3.87 - 5.11 (MIL/uL)   Hemoglobin 13.5  12.0 - 15.0 (g/dL)   HCT 40.9  81.1 - 91.4 (%)   MCV 89.1  78.0 - 100.0 (fL)   MCH 30.6  26.0 - 34.0 (pg)   MCHC 34.4  30.0 - 36.0 (g/dL)   RDW 78.2  95.6 - 21.3 (%)   Platelets 440 (*) 150 - 400 (K/uL)  DIFFERENTIAL      Component Value Range   Neutrophils Relative 62  43 - 77 (%)   Lymphocytes Relative 28   12 - 46 (%)   Monocytes Relative 5  3 - 12 (%)   Eosinophils Relative 4  0 - 5 (%)   Basophils Relative 1  0 - 1 (%)   Neutro Abs 7.0  1.7 - 7.7 (K/uL)   Lymphs Abs 3.2  0.7 - 4.0 (K/uL)   Monocytes Absolute 0.6  0.1 - 1.0 (  K/uL)   Eosinophils Absolute 0.5  0.0 - 0.7 (K/uL)   Basophils Absolute 0.1  0.0 - 0.1 (K/uL)  COMPREHENSIVE METABOLIC PANEL      Component Value Range   Sodium 135  135 - 145 (mEq/L)   Potassium 4.1  3.5 - 5.1 (mEq/L)   Chloride 100  96 - 112 (mEq/L)   CO2 24  19 - 32 (mEq/L)   Glucose, Bld 85  70 - 99 (mg/dL)   BUN 9  6 - 23 (mg/dL)   Creatinine, Ser 1.61  0.50 - 1.10 (mg/dL)   Calcium 9.7  8.4 - 09.6 (mg/dL)   Total Protein 8.2  6.0 - 8.3 (g/dL)   Albumin 3.7  3.5 - 5.2 (g/dL)   AST 17  0 - 37 (U/L)   ALT 33  0 - 35 (U/L)   Alkaline Phosphatase 91  39 - 117 (U/L)   Total Bilirubin 0.3  0.3 - 1.2 (mg/dL)   GFR calc non Af Amer 74 (*) >90 (mL/min)   GFR calc Af Amer 86 (*) >90 (mL/min)  LIPASE, BLOOD      Component Value Range   Lipase 17  11 - 59 (U/L)  URINALYSIS, ROUTINE W REFLEX MICROSCOPIC      Component Value Range   Color, Urine STRAW (*) YELLOW    APPearance CLEAR  CLEAR    Specific Gravity, Urine <1.005 (*) 1.005 - 1.030    pH 6.0  5.0 - 8.0    Glucose, UA NEGATIVE  NEGATIVE (mg/dL)   Hgb urine dipstick SMALL (*) NEGATIVE    Bilirubin Urine NEGATIVE  NEGATIVE    Ketones, ur NEGATIVE  NEGATIVE (mg/dL)   Protein, ur NEGATIVE  NEGATIVE (mg/dL)   Urobilinogen, UA 0.2  0.0 - 1.0 (mg/dL)   Nitrite NEGATIVE  NEGATIVE    Leukocytes, UA NEGATIVE  NEGATIVE   URINE MICROSCOPIC-ADD ON      Component Value Range   Squamous Epithelial / LPF FEW (*) RARE    WBC, UA 3-6  <3 (WBC/hpf)   RBC / HPF 3-6  <3 (RBC/hpf)  POCT PREGNANCY, URINE      Component Value Range   Preg Test, Ur NEGATIVE  NEGATIVE    Dg Chest 2 View  08/20/2011  *RADIOLOGY REPORT*  Clinical Data: Progressively worsening cough and fever; body aches. History of smoking.  CHEST - 2 VIEW   Comparison: Chest radiograph performed 09/29/2009  Findings: The lungs are well-aerated.  Minimal left midlung opacity could reflect mild pneumonia.  There is no evidence of pleural effusion or pneumothorax.  The heart is normal in size; the mediastinal contour is within normal limits.  No acute osseous abnormalities are seen.  IMPRESSION: Minimal left midlung airspace opacity could reflect mild pneumonia.  Original Report Authenticated By: Tonia Ghent, M.D.   Dg Abd Acute W/chest  09/03/2011  *RADIOLOGY REPORT*  Clinical Data: Right upper quadrant abdominal pain  ACUTE ABDOMEN SERIES (ABDOMEN 2 VIEW & CHEST 1 VIEW)  Comparison: 01/28/2010  Findings: Heart size appears normal.  No pleural effusion or pulmonary edema identified.  No airspace consolidation identified.  The bowel gas pattern appears nonobstructed.  There is gas and stool noted throughout the colon up to the rectum.  No small bowel dilatation or air-fluid levels identified.  IMPRESSION: Nonobstructive bowel gas pattern.  Original Report Authenticated By: Rosealee Albee, M.D.    Lab workup showed WBC 11,000, down from 15,000 two days ago.  UA, chemistries, and acute abdominal series negative.  I advised pt of her lab results and gave her a copy of them.  I advised her that she needed to have evaluation by a surgeon, as she had had ED evaluation and symptomatic treatment, and needed a definitive evaluation to decide if she needed cholecystectomy.  She plans to see a Dr. Lowell Guitar at [facility].   1. Abdominal  pain, other specified site        Carleene Cooper III, MD 09/03/11 1810

## 2011-09-04 LAB — URINE CULTURE: Culture: NO GROWTH

## 2012-02-09 ENCOUNTER — Encounter (HOSPITAL_BASED_OUTPATIENT_CLINIC_OR_DEPARTMENT_OTHER): Payer: Self-pay | Admitting: *Deleted

## 2012-02-09 ENCOUNTER — Emergency Department (HOSPITAL_BASED_OUTPATIENT_CLINIC_OR_DEPARTMENT_OTHER)
Admission: EM | Admit: 2012-02-09 | Discharge: 2012-02-10 | Disposition: A | Discharge status: Other Acute Inpatient Hospital | Payer: Medicaid Other | Attending: Emergency Medicine | Admitting: Emergency Medicine

## 2012-02-09 DIAGNOSIS — R05 Cough: Secondary | ICD-10-CM | POA: Insufficient documentation

## 2012-02-09 DIAGNOSIS — R059 Cough, unspecified: Secondary | ICD-10-CM | POA: Insufficient documentation

## 2012-02-09 DIAGNOSIS — R000 Tachycardia, unspecified: Secondary | ICD-10-CM | POA: Insufficient documentation

## 2012-02-09 DIAGNOSIS — R0989 Other specified symptoms and signs involving the circulatory and respiratory systems: Secondary | ICD-10-CM | POA: Insufficient documentation

## 2012-02-09 DIAGNOSIS — F3130 Bipolar disorder, current episode depressed, mild or moderate severity, unspecified: Secondary | ICD-10-CM | POA: Insufficient documentation

## 2012-02-09 DIAGNOSIS — F17200 Nicotine dependence, unspecified, uncomplicated: Secondary | ICD-10-CM | POA: Insufficient documentation

## 2012-02-09 DIAGNOSIS — R1011 Right upper quadrant pain: Secondary | ICD-10-CM | POA: Insufficient documentation

## 2012-02-09 DIAGNOSIS — R6883 Chills (without fever): Secondary | ICD-10-CM | POA: Insufficient documentation

## 2012-02-09 DIAGNOSIS — R1110 Vomiting, unspecified: Secondary | ICD-10-CM | POA: Insufficient documentation

## 2012-02-09 DIAGNOSIS — R197 Diarrhea, unspecified: Secondary | ICD-10-CM | POA: Insufficient documentation

## 2012-02-09 DIAGNOSIS — R0609 Other forms of dyspnea: Secondary | ICD-10-CM | POA: Insufficient documentation

## 2012-02-09 DIAGNOSIS — K219 Gastro-esophageal reflux disease without esophagitis: Secondary | ICD-10-CM | POA: Insufficient documentation

## 2012-02-09 DIAGNOSIS — R0602 Shortness of breath: Secondary | ICD-10-CM | POA: Insufficient documentation

## 2012-02-09 DIAGNOSIS — J45909 Unspecified asthma, uncomplicated: Secondary | ICD-10-CM | POA: Insufficient documentation

## 2012-02-09 DIAGNOSIS — Z79899 Other long term (current) drug therapy: Secondary | ICD-10-CM | POA: Insufficient documentation

## 2012-02-09 DIAGNOSIS — R0902 Hypoxemia: Secondary | ICD-10-CM

## 2012-02-09 DIAGNOSIS — J189 Pneumonia, unspecified organism: Secondary | ICD-10-CM | POA: Insufficient documentation

## 2012-02-09 MED ORDER — ALBUTEROL SULFATE (5 MG/ML) 0.5% IN NEBU
5.0000 mg | INHALATION_SOLUTION | Freq: Once | RESPIRATORY_TRACT | Status: AC
  Administered 2012-02-10: 5 mg via RESPIRATORY_TRACT
  Filled 2012-02-09: qty 1

## 2012-02-09 MED ORDER — IPRATROPIUM BROMIDE 0.02 % IN SOLN
0.5000 mg | Freq: Once | RESPIRATORY_TRACT | Status: AC
  Administered 2012-02-10: 0.5 mg via RESPIRATORY_TRACT
  Filled 2012-02-09: qty 2.5

## 2012-02-09 NOTE — ED Notes (Signed)
Pt states that she has been sick for a couple days with diarrhea vomiting and cough pt also with a C/O abd pain as well presents to ED with SOB and cough

## 2012-02-10 ENCOUNTER — Emergency Department (HOSPITAL_BASED_OUTPATIENT_CLINIC_OR_DEPARTMENT_OTHER): Payer: Medicaid Other

## 2012-02-10 LAB — CBC WITH DIFFERENTIAL/PLATELET
Basophils Absolute: 0 10*3/uL (ref 0.0–0.1)
Basophils Relative: 0 % (ref 0–1)
Eosinophils Absolute: 0 10*3/uL (ref 0.0–0.7)
Hemoglobin: 11.3 g/dL — ABNORMAL LOW (ref 12.0–15.0)
MCH: 30.2 pg (ref 26.0–34.0)
MCHC: 34.8 g/dL (ref 30.0–36.0)
Monocytes Absolute: 1.3 10*3/uL — ABNORMAL HIGH (ref 0.1–1.0)
Neutro Abs: 30.1 10*3/uL — ABNORMAL HIGH (ref 1.7–7.7)
Neutrophils Relative %: 92 % — ABNORMAL HIGH (ref 43–77)
RDW: 14.8 % (ref 11.5–15.5)

## 2012-02-10 LAB — COMPREHENSIVE METABOLIC PANEL
CO2: 21 mEq/L (ref 19–32)
Calcium: 9.1 mg/dL (ref 8.4–10.5)
Creatinine, Ser: 1.1 mg/dL (ref 0.50–1.10)
GFR calc Af Amer: 76 mL/min — ABNORMAL LOW (ref >90)
GFR calc non Af Amer: 65 mL/min — ABNORMAL LOW (ref >90)
Glucose, Bld: 222 mg/dL — ABNORMAL HIGH (ref 70–99)
Total Protein: 7.4 g/dL (ref 6.0–8.3)

## 2012-02-10 LAB — POCT I-STAT 3, ART BLOOD GAS (G3+)
pCO2 arterial: 41.9 mmHg (ref 35.0–45.0)
pH, Arterial: 7.314 — ABNORMAL LOW (ref 7.350–7.450)

## 2012-02-10 LAB — LIPASE, BLOOD: Lipase: 13 U/L (ref 11–59)

## 2012-02-10 MED ORDER — ALBUTEROL SULFATE (5 MG/ML) 0.5% IN NEBU
INHALATION_SOLUTION | RESPIRATORY_TRACT | Status: AC
  Filled 2012-02-10: qty 1

## 2012-02-10 MED ORDER — IPRATROPIUM BROMIDE 0.02 % IN SOLN
RESPIRATORY_TRACT | Status: AC
  Filled 2012-02-10: qty 2.5

## 2012-02-10 MED ORDER — DEXTROSE 5 % IV SOLN
500.0000 mg | Freq: Once | INTRAVENOUS | Status: AC
  Administered 2012-02-10: 500 mg via INTRAVENOUS
  Filled 2012-02-10: qty 500

## 2012-02-10 MED ORDER — IPRATROPIUM BROMIDE 0.02 % IN SOLN
0.5000 mg | Freq: Once | RESPIRATORY_TRACT | Status: AC
  Administered 2012-02-10: 0.5 mg via RESPIRATORY_TRACT

## 2012-02-10 MED ORDER — ONDANSETRON HCL 4 MG/2ML IJ SOLN
4.0000 mg | Freq: Once | INTRAMUSCULAR | Status: AC
Start: 2012-02-10 — End: 2012-02-10
  Administered 2012-02-10: 4 mg via INTRAVENOUS
  Filled 2012-02-10: qty 2

## 2012-02-10 MED ORDER — SODIUM CHLORIDE 0.9 % IV BOLUS (SEPSIS)
1000.0000 mL | Freq: Once | INTRAVENOUS | Status: AC
  Administered 2012-02-10: 1000 mL via INTRAVENOUS

## 2012-02-10 MED ORDER — ALBUTEROL SULFATE (5 MG/ML) 0.5% IN NEBU
5.0000 mg | INHALATION_SOLUTION | Freq: Once | RESPIRATORY_TRACT | Status: AC
  Administered 2012-02-10: 5 mg via RESPIRATORY_TRACT

## 2012-02-10 MED ORDER — SODIUM CHLORIDE 0.9 % IV BOLUS (SEPSIS): 1000.0000 mL | Freq: Once | INTRAVENOUS | Status: DC

## 2012-02-10 MED ORDER — DEXTROSE 5 % IV SOLN
1.0000 g | Freq: Once | INTRAVENOUS | Status: AC
  Administered 2012-02-10: 1 g via INTRAVENOUS
  Filled 2012-02-10: qty 10

## 2012-02-10 MED ORDER — METHYLPREDNISOLONE SODIUM SUCC 125 MG IJ SOLR
125.0000 mg | Freq: Once | INTRAMUSCULAR | Status: AC
  Administered 2012-02-10: 125 mg via INTRAVENOUS
  Filled 2012-02-10: qty 2

## 2012-02-10 MED ORDER — ALBUTEROL SULFATE (5 MG/ML) 0.5% IN NEBU
5.0000 mg | INHALATION_SOLUTION | Freq: Once | RESPIRATORY_TRACT | Status: AC
  Administered 2012-02-10: 5 mg via RESPIRATORY_TRACT
  Filled 2012-02-10: qty 1

## 2012-02-10 NOTE — ED Notes (Signed)
Pt is on home 02 at night 

## 2012-02-10 NOTE — ED Notes (Signed)
Pt was asleep when checked and in no distressed. SPO2 on [place] 5 lpm was 94%. Pt did state that her breathing had improved but she was tired.

## 2012-02-10 NOTE — ED Notes (Signed)
Pt has a hx of asthma, pt currently smokes and has been sick with a cold, Pt takes Qvar MDI BID. Pt states that she went to her primary MD to be seen and was given steroids to start taking which she stated she started today. Pt presented to the ED with a strong nonproductive cough, labored breathing and low oxygen level on RA of 80%, Pt walked to the bathroom and oxygen levels dropped to 70% on RA, pt was started on HHN tx Albuterol and Atrovent for diminished inspiratory and expiratory wheezes. Pt is also getting a chest X-ray

## 2012-02-10 NOTE — ED Provider Notes (Signed)
History     CSN: [id number]  Arrival date & time 02/09/12  2341   First MD Initiated Contact with Patient 02/10/12 [phone]      Chief Complaint  Patient presents with  . Shortness of Breath    (Consider location/radiation/quality/duration/timing/severity/associated sxs/prior treatment) Patient is a 36 y.o. female presenting with shortness of breath.  Shortness of Breath  Associated symptoms include shortness of breath.    Patient presents with 2 day history of cough and dyspnea. She states that she began having these symptoms on Tuesday and was seen by her primary care Dr. She uses an albuterol inhaler and nebulizer and has been using this regularly at home. She is a smoker and continues to smoke. She also states that she has had some diarrhea and vomiting prior to the onset of the cough. She has had some diffuse upper abdominal pain which she has had on a chronic basis and has had an ultrasound and HIDA scan which she self reports as normal. She has not had any fever but has had some chills. She describes of vomiting x4 today. Patient states on oxygen 3 l/m at home for sleep apnea but has been using round the clock for the past 24 hours.  Past Medical History  Diagnosis Date  . Neuropathy   . Bipolar disorder   . GERD (gastroesophageal reflux disease)   . Depression   . Asthma   . Sleep apnea     Past Surgical History  Procedure Date  . Cardiac surgery   . Tubal ligation   . Appendectomy     History reviewed. No pertinent family history.  History  Substance Use Topics  . Smoking status: Current Everyday Smoker -- 0.5 packs/day    Types: Cigarettes  . Smokeless tobacco: Not on file  . Alcohol Use: No    OB History    Grav Para Term Preterm Abortions TAB SAB Ect Mult Living                  Review of Systems  Respiratory: Positive for shortness of breath.   All other systems reviewed and are negative.    Allergies  Cephalexin; Ciprofloxacin; Morphine and  related; Trazodone and nefazodone; and Ketorolac tromethamine  Home Medications   Current Outpatient Rx  Name Route Sig Dispense Refill  . ALBUTEROL SULFATE HFA 108 (90 BASE) MCG/ACT IN AERS Inhalation Inhale 2 puffs into the lungs every 6 (six) hours as needed. Asthma     . BECLOMETHASONE DIPROPIONATE 40 MCG/ACT IN AERS Inhalation Inhale 2 puffs into the lungs 2 (two) times daily.      . BUPROPION HCL ER (XL) 300 MG PO TB24 Oral Take 300 mg by mouth daily.    Marland Kitchen GABAPENTIN 600 MG PO TABS Oral Take 600 mg by mouth 3 (three) times daily.    Marland Kitchen HYDROXYZINE PAMOATE 100 MG PO CAPS Oral Take 100 mg by mouth 2 (two) times daily.    Marland Kitchen NAPROXEN SODIUM 220 MG PO TABS Oral Take 440 mg by mouth every 6 (six) hours as needed. For fever or pain    . OMEPRAZOLE 20 MG PO CPDR Oral Take 20 mg by mouth 2 (two) times daily.      . QUETIAPINE FUMARATE 400 MG PO TABS Oral Take 400 mg by mouth at bedtime.      BP 115/80  Pulse 112  Temp 99 F (37.2 C)  Resp 27  SpO2 95%  LMP 02/09/2012  Physical Exam  Nursing  note and vitals reviewed. Constitutional: She appears well-developed and well-nourished.  HENT:  Head: Normocephalic and atraumatic.  Right Ear: External ear normal.  Left Ear: External ear normal.  Nose: Nose normal.  Mouth/Throat: Oropharynx is clear and moist.  Eyes: Conjunctivae are normal. Pupils are equal, round, and reactive to light.  Neck: Normal range of motion.  Cardiovascular: Tachycardia present.   Pulmonary/Chest: Effort normal.       Some decreased air movement  Abdominal: Soft. Normal appearance. She exhibits no distension, no fluid wave, no ascites and no mass. There is tenderness in the right upper quadrant.    ED Course  Procedures (including critical care time)   Labs Reviewed  CBC WITH DIFFERENTIAL  COMPREHENSIVE METABOLIC PANEL  LIPASE, BLOOD   Dg Chest 2 View  02/10/2012  *RADIOLOGY REPORT*  Clinical Data: Shortness of breath.  CHEST - 2 VIEW  Comparison:  08/19/2011  Findings: Patchy right midlung and left mid and lower lobe airspace opacities are noted.  Findings could reflect pneumonia.  No effusions.  Heart is borderline in size.  No effusions.  IMPRESSION: Patchy bilateral airspace opacities could reflect pneumonia.  Original Report Authenticated By: Cyndie Chime, M.D.     No diagnosis found.    MDM  .0130 AM Patient question regarding her reaction to Keflex. She states that she gets a rash in the ointment region. She has not ever had any shortness of breath, rash, or other symptoms from Keflex  . Patient with sats at 95% on 6 l n/c.  Patient with decreased loc.  Oxygen stopped and sats decreased to 84% and abg drawn.  Patient arousable and answers questions but fall asleep without stimulus.  CXR reviewed and discussed with patient.  Patient strongly advised regarding need for hosptialization but continues resistant.  Advised of risk of death. ABG pending.    3:12 AM   PMD is Dr. Marita Kansas at [facility].  Patient with albuterol nebulizing.  Oxygen in place at 3 l/m sats at 94%.  Patient with history of sleep apnea and has had bipap in past.  Plan bipap now.  Page placed to [facility] at [employer].  Patient's care discussed with Dr.Powell on call for [facility] and awaiting bed assignment. 3:17 AM   Patient with uri symptoms with pneumonia on cxr and hypoxia.  Patient with known history of sleep apnea but not retaining co2 now.  Patient to be placed on bipap for decreased wob.  Results for orders placed during the hospital encounter of 02/09/12  CBC WITH DIFFERENTIAL      Component Value Range   WBC 32.7 (*) 4.0 - 10.5 K/uL   RBC 3.74 (*) 3.87 - 5.11 MIL/uL   Hemoglobin 11.3 (*) 12.0 - 15.0 g/dL   HCT 16.1 (*) 09.6 - 04.5 %   MCV 86.9  78.0 - 100.0 fL   MCH 30.2  26.0 - 34.0 pg   MCHC 34.8  30.0 - 36.0 g/dL   RDW 40.9  81.1 - 91.4 %   Platelets 389  150 - 400 K/uL   Neutrophils Relative 92 (*) 43  - 77 %   Lymphocytes Relative 4 (*) 12 - 46 %   Monocytes Relative 4  3 - 12 %   Eosinophils Relative 0  0 - 5 %   Basophils Relative 0  0 - 1 %   Neutro Abs 30.1 (*) 1.7 - 7.7 K/uL   Lymphs Abs 1.3  0.7 - 4.0 K/uL  Monocytes Absolute 1.3 (*) 0.1 - 1.0 K/uL   Eosinophils Absolute 0.0  0.0 - 0.7 K/uL   Basophils Absolute 0.0  0.0 - 0.1 K/uL   WBC Morphology MILD LEFT SHIFT (1-5% METAS, OCC MYELO, OCC BANDS)     Smear Review PLATELET COUNT CONFIRMED BY SMEAR    COMPREHENSIVE METABOLIC PANEL      Component Value Range   Sodium 139  135 - 145 mEq/L   Potassium 3.6  3.5 - 5.1 mEq/L   Chloride 106  96 - 112 mEq/L   CO2 21  19 - 32 mEq/L   Glucose, Bld 222 (*) 70 - 99 mg/dL   BUN 15  6 - 23 mg/dL   Creatinine, Ser 1.61  0.50 - 1.10 mg/dL   Calcium 9.1  8.4 - 09.6 mg/dL   Total Protein 7.4  6.0 - 8.3 g/dL   Albumin 3.5  3.5 - 5.2 g/dL   AST 21  0 - 37 U/L   ALT 12  0 - 35 U/L   Alkaline Phosphatase 90  39 - 117 U/L   Total Bilirubin 0.1 (*) 0.3 - 1.2 mg/dL   GFR calc non Af Amer 65 (*) >90 mL/min   GFR calc Af Amer 76 (*) >90 mL/min  LIPASE, BLOOD      Component Value Range   Lipase 13  11 - 59 U/L  POCT I-STAT 3, BLOOD GAS (G3+)      Component Value Range   pH, Arterial 7.314 (*) 7.350 - 7.450   pCO2 arterial 41.9  35.0 - 45.0 mmHg   pO2, Arterial 50.0 (*) 80.0 - 100.0 mmHg   Bicarbonate 21.3  20.0 - 24.0 mEq/L   TCO2 23  0 - 100 mmol/L   O2 Saturation 81.0     Acid-base deficit 5.0 (*) 0.0 - 2.0 mmol/L   Patient temperature 98.9 F     Collection site RADIAL, ALLEN'S TEST ACCEPTABLE     Drawn by RT     Sample type ARTERIAL     CRITICAL CARE Performed by: Hilario Quarry   Total critical care time:60  Critical care time was exclusive of separately billable procedures and treating other patients.  Critical care was necessary to treat or prevent imminent or life-threatening deterioration.  Critical care was time spent personally by me on the following activities:  development of treatment plan with patient and/or surrogate as well as nursing, discussions with consultants, evaluation of patient's response to treatment, examination of patient, obtaining history from patient or surrogate, ordering and performing treatments and interventions, ordering and review of laboratory studies, ordering and review of radiographic studies, pulse oximetry and re-evaluation of patient's condition.      Hilario Quarry, MD 02/10/12 [phone]

## 2014-06-26 ENCOUNTER — Encounter (HOSPITAL_COMMUNITY): Payer: Self-pay | Admitting: Emergency Medicine

## 2014-06-26 ENCOUNTER — Emergency Department (HOSPITAL_COMMUNITY)
Admission: EM | Admit: 2014-06-26 | Discharge: 2014-06-26 | Disposition: A | Discharge status: Home / Self Care | Payer: Medicare Other | Attending: Emergency Medicine | Admitting: Emergency Medicine

## 2014-06-26 DIAGNOSIS — R112 Nausea with vomiting, unspecified: Secondary | ICD-10-CM | POA: Diagnosis not present

## 2014-06-26 DIAGNOSIS — Z79899 Other long term (current) drug therapy: Secondary | ICD-10-CM | POA: Insufficient documentation

## 2014-06-26 DIAGNOSIS — J45909 Unspecified asthma, uncomplicated: Secondary | ICD-10-CM | POA: Insufficient documentation

## 2014-06-26 DIAGNOSIS — R1011 Right upper quadrant pain: Secondary | ICD-10-CM | POA: Insufficient documentation

## 2014-06-26 DIAGNOSIS — Z720 Tobacco use: Secondary | ICD-10-CM | POA: Insufficient documentation

## 2014-06-26 DIAGNOSIS — M25561 Pain in right knee: Secondary | ICD-10-CM | POA: Diagnosis not present

## 2014-06-26 DIAGNOSIS — Z3202 Encounter for pregnancy test, result negative: Secondary | ICD-10-CM | POA: Diagnosis not present

## 2014-06-26 DIAGNOSIS — F319 Bipolar disorder, unspecified: Secondary | ICD-10-CM | POA: Diagnosis not present

## 2014-06-26 DIAGNOSIS — R1010 Upper abdominal pain, unspecified: Secondary | ICD-10-CM

## 2014-06-26 DIAGNOSIS — K219 Gastro-esophageal reflux disease without esophagitis: Secondary | ICD-10-CM | POA: Insufficient documentation

## 2014-06-26 LAB — CBC WITH DIFFERENTIAL/PLATELET
BASOS ABS: 0 10*3/uL (ref 0.0–0.1)
Basophils Relative: 0 % (ref 0–1)
EOS ABS: 0.4 10*3/uL (ref 0.0–0.7)
EOS PCT: 3 % (ref 0–5)
HCT: 38.4 % (ref 36.0–46.0)
Hemoglobin: 13.4 g/dL (ref 12.0–15.0)
LYMPHS ABS: 4.5 10*3/uL — AB (ref 0.7–4.0)
Lymphocytes Relative: 31 % (ref 12–46)
MCH: 32.4 pg (ref 26.0–34.0)
MCHC: 34.9 g/dL (ref 30.0–36.0)
MCV: 93 fL (ref 78.0–100.0)
Monocytes Absolute: 1 10*3/uL (ref 0.1–1.0)
Monocytes Relative: 7 % (ref 3–12)
NEUTROS PCT: 59 % (ref 43–77)
Neutro Abs: 8.6 10*3/uL — ABNORMAL HIGH (ref 1.7–7.7)
PLATELETS: 312 10*3/uL (ref 150–400)
RBC: 4.13 MIL/uL (ref 3.87–5.11)
RDW: 13.6 % (ref 11.5–15.5)
SMEAR REVIEW: ADEQUATE
WBC: 14.5 10*3/uL — ABNORMAL HIGH (ref 4.0–10.5)

## 2014-06-26 LAB — URINE MICROSCOPIC-ADD ON

## 2014-06-26 LAB — URINALYSIS, ROUTINE W REFLEX MICROSCOPIC
Bilirubin Urine: NEGATIVE
Glucose, UA: NEGATIVE mg/dL
KETONES UR: NEGATIVE mg/dL
Leukocytes, UA: NEGATIVE
NITRITE: NEGATIVE
Protein, ur: NEGATIVE mg/dL
Specific Gravity, Urine: 1.026 (ref 1.005–1.030)
Urobilinogen, UA: 0.2 mg/dL (ref 0.0–1.0)
pH: 5.5 (ref 5.0–8.0)

## 2014-06-26 LAB — COMPREHENSIVE METABOLIC PANEL
ALT: 18 U/L (ref 0–35)
ANION GAP: 13 (ref 5–15)
AST: 15 U/L (ref 0–37)
Albumin: 3.5 g/dL (ref 3.5–5.2)
Alkaline Phosphatase: 77 U/L (ref 39–117)
BILIRUBIN TOTAL: 0.2 mg/dL — AB (ref 0.3–1.2)
BUN: 16 mg/dL (ref 6–23)
CO2: 25 mEq/L (ref 19–32)
CREATININE: 1.09 mg/dL (ref 0.50–1.10)
Calcium: 9.2 mg/dL (ref 8.4–10.5)
Chloride: 103 mEq/L (ref 96–112)
GFR calc Af Amer: 75 mL/min — ABNORMAL LOW (ref >90)
GFR calc non Af Amer: 64 mL/min — ABNORMAL LOW (ref >90)
Glucose, Bld: 77 mg/dL (ref 70–99)
Potassium: 3.6 mEq/L — ABNORMAL LOW (ref 3.7–5.3)
Sodium: 141 mEq/L (ref 137–147)
TOTAL PROTEIN: 7.3 g/dL (ref 6.0–8.3)

## 2014-06-26 LAB — PREGNANCY, URINE: PREG TEST UR: NEGATIVE

## 2014-06-26 MED ORDER — HYOSCYAMINE SULFATE 0.125 MG SL SUBL: 0.1250 mg | SUBLINGUAL_TABLET | SUBLINGUAL | Status: DC | PRN

## 2014-06-26 MED ORDER — ONDANSETRON 8 MG PO TBDP: 8.0000 mg | ORAL_TABLET | Freq: Three times a day (TID) | ORAL | Status: DC | PRN

## 2014-06-26 MED ORDER — ONDANSETRON 4 MG PO TBDP
8.0000 mg | ORAL_TABLET | Freq: Once | ORAL | Status: AC
  Administered 2014-06-26: 8 mg via ORAL
  Filled 2014-06-26: qty 2

## 2014-06-26 MED ORDER — OXYCODONE-ACETAMINOPHEN 5-325 MG PO TABS
2.0000 | ORAL_TABLET | Freq: Once | ORAL | Status: AC
  Administered 2014-06-26: 2 via ORAL
  Filled 2014-06-26: qty 2

## 2014-06-26 MED ORDER — HYOSCYAMINE SULFATE 0.125 MG PO TABS
0.1250 mg | ORAL_TABLET | Freq: Once | ORAL | Status: AC
  Administered 2014-06-26: 0.125 mg via ORAL
  Filled 2014-06-26: qty 1

## 2014-06-26 MED ORDER — OXYCODONE-ACETAMINOPHEN 5-325 MG PO TABS: 2.0000 | ORAL_TABLET | ORAL | Status: DC | PRN

## 2014-06-26 MED ORDER — PROMETHAZINE HCL 25 MG/ML IJ SOLN
25.0000 mg | Freq: Once | INTRAMUSCULAR | Status: AC
  Administered 2014-06-26: 25 mg via INTRAVENOUS
  Filled 2014-06-26: qty 1

## 2014-06-26 MED ORDER — HYDROMORPHONE HCL 1 MG/ML IJ SOLN
1.0000 mg | Freq: Once | INTRAMUSCULAR | Status: AC
  Administered 2014-06-26: 1 mg via INTRAVENOUS
  Filled 2014-06-26: qty 1

## 2014-06-26 MED ORDER — ALBUTEROL SULFATE HFA 108 (90 BASE) MCG/ACT IN AERS
1.0000 | INHALATION_SPRAY | RESPIRATORY_TRACT | Status: DC | PRN
  Administered 2014-06-26: 2 via RESPIRATORY_TRACT
  Filled 2014-06-26: qty 6.7

## 2014-06-26 NOTE — Discharge Instructions (Signed)
Knee Pain The knee is the complex joint between your thigh and your lower leg. It is made up of bones, tendons, ligaments, and cartilage. The bones that make up the knee are:  The femur in the thigh.  The tibia and fibula in the lower leg.  The patella or kneecap riding in the groove on the lower femur. CAUSES  Knee pain is a common complaint with many causes. A few of these causes are:  Injury, such as:  A ruptured ligament or tendon injury.  Torn cartilage.  Medical conditions, such as:  Gout  Arthritis  Infections  Overuse, over training, or overdoing a physical activity. Knee pain can be minor or severe. Knee pain can accompany debilitating injury. Minor knee problems often respond well to self-care measures or get well on their own. More serious injuries may need medical intervention or even surgery. SYMPTOMS The knee is complex. Symptoms of knee problems can vary widely. Some of the problems are:  Pain with movement and weight bearing.  Swelling and tenderness.  Buckling of the knee.  Inability to straighten or extend your knee.  Your knee locks and you cannot straighten it.  Warmth and redness with pain and fever.  Deformity or dislocation of the kneecap. DIAGNOSIS  Determining what is wrong may be very straight forward such as when there is an injury. It can also be challenging because of the complexity of the knee. Tests to make a diagnosis may include:  Your caregiver taking a history and doing a physical exam.  Routine X-rays can be used to rule out other problems. X-rays will not reveal a cartilage tear. Some injuries of the knee can be diagnosed by:  Arthroscopy a surgical technique by which a small video camera is inserted through tiny incisions on the sides of the knee. This procedure is used to examine and repair internal knee joint problems. Tiny instruments can be used during arthroscopy to repair the torn knee cartilage (meniscus).  Arthrography  is a radiology technique. A contrast liquid is directly injected into the knee joint. Internal structures of the knee joint then become visible on X-ray film.  An MRI scan is a non X-ray radiology procedure in which magnetic fields and a computer produce two- or three-dimensional images of the inside of the knee. Cartilage tears are often visible using an MRI scanner. MRI scans have largely replaced arthrography in diagnosing cartilage tears of the knee.  Blood work.  Examination of the fluid that helps to lubricate the knee joint (synovial fluid). This is done by taking a sample out using a needle and a syringe. TREATMENT The treatment of knee problems depends on the cause. Some of these treatments are:  Depending on the injury, proper casting, splinting, surgery, or physical therapy care will be needed.  Give yourself adequate recovery time. Do not overuse your joints. If you begin to get sore during workout routines, back off. Slow down or do fewer repetitions.  For repetitive activities such as cycling or running, maintain your strength and nutrition.  Alternate muscle groups. For example, if you are a weight lifter, work the upper body on one day and the lower body the next.  Either tight or weak muscles do not give the proper support for your knee. Tight or weak muscles do not absorb the stress placed on the knee joint. Keep the muscles surrounding the knee strong.  Take care of mechanical problems.  If you have flat feet, orthotics or special shoes may help.  See your caregiver if you need help.  Arch supports, sometimes with wedges on the inner or outer aspect of the heel, can help. These can shift pressure away from the side of the knee most bothered by osteoarthritis.  A brace called an "unloader" brace also may be used to help ease the pressure on the most arthritic side of the knee.  If your caregiver has prescribed crutches, braces, wraps or ice, use as directed. The acronym  for this is PRICE. This means protection, rest, ice, compression, and elevation.  Nonsteroidal anti-inflammatory drugs (NSAIDs), can help relieve pain. But if taken immediately after an injury, they may actually increase swelling. Take NSAIDs with food in your stomach. Stop them if you develop stomach problems. Do not take these if you have a history of ulcers, stomach pain, or bleeding from the bowel. Do not take without your caregiver's approval if you have problems with fluid retention, heart failure, or kidney problems.  For ongoing knee problems, physical therapy may be helpful.  Glucosamine and chondroitin are over-the-counter dietary supplements. Both may help relieve the pain of osteoarthritis in the knee. These medicines are different from the usual anti-inflammatory drugs. Glucosamine may decrease the rate of cartilage destruction.  Injections of a corticosteroid drug into your knee joint may help reduce the symptoms of an arthritis flare-up. They may provide pain relief that lasts a few months. You may have to wait a few months between injections. The injections do have a small increased risk of infection, water retention, and elevated blood sugar levels.  Hyaluronic acid injected into damaged joints may ease pain and provide lubrication. These injections may work by reducing inflammation. A series of shots may give relief for as long as 6 months.  Topical painkillers. Applying certain ointments to your skin may help relieve the pain and stiffness of osteoarthritis. Ask your pharmacist for suggestions. Many over the-counter products are approved for temporary relief of arthritis pain.  In some countries, doctors often prescribe topical NSAIDs for relief of chronic conditions such as arthritis and tendinitis. A review of treatment with NSAID creams found that they worked as well as oral medications but without the serious side effects. PREVENTION  Maintain a healthy weight. Extra pounds  put more strain on your joints.  Get strong, stay limber. Weak muscles are a common cause of knee injuries. Stretching is important. Include flexibility exercises in your workouts.  Be smart about exercise. If you have osteoarthritis, chronic knee pain or recurring injuries, you may need to change the way you exercise. This does not mean you have to stop being active. If your knees ache after jogging or playing basketball, consider switching to swimming, water aerobics, or other low-impact activities, at least for a few days a week. Sometimes limiting high-impact activities will provide relief.  Make sure your shoes fit well. Choose footwear that is right for your sport.  Protect your knees. Use the proper gear for knee-sensitive activities. Use kneepads when playing volleyball or laying carpet. Buckle your seat belt every time you drive. Most shattered kneecaps occur in car accidents.  Rest when you are tired. SEEK MEDICAL CARE IF:  You have knee pain that is continual and does not seem to be getting better.  SEEK IMMEDIATE MEDICAL CARE IF:  Your knee joint feels hot to the touch and you have a high fever. MAKE SURE YOU:  Abdominal Pain Many things can cause abdominal pain. Usually, abdominal pain is not caused by a disease and will  improve without treatment. It can often be observed and treated at home. Your health care provider will do a physical exam and possibly order blood tests and X-rays to help determine the seriousness of your pain. However, in many cases, more time must pass before a clear cause of the pain can be found. Before that point, your health care provider may not know if you need more testing or further treatment. HOME CARE INSTRUCTIONS  Monitor your abdominal pain for any changes. The following actions may help to alleviate any discomfort you are experiencing:  Only take over-the-counter or prescription medicines as directed by your health care provider.  Do not take  laxatives unless directed to do so by your health care provider.  Try a clear liquid diet (broth, tea, or water) as directed by your health care provider. Slowly move to a bland diet as tolerated. SEEK MEDICAL CARE IF:  You have unexplained abdominal pain.  You have abdominal pain associated with nausea or diarrhea.  You have pain when you urinate or have a bowel movement.  You experience abdominal pain that wakes you in the night.  You have abdominal pain that is worsened or improved by eating food.  You have abdominal pain that is worsened with eating fatty foods.  You have a fever. SEEK IMMEDIATE MEDICAL CARE IF:   Your pain does not go away within 2 hours.  You keep throwing up (vomiting).  Your pain is felt only in portions of the abdomen, such as the right side or the left lower portion of the abdomen.  You pass bloody or black tarry stools. MAKE SURE YOU:  Understand these instructions.   Will watch your condition.   Will get help right away if you are not doing well or get worse.  Document Released: 05/04/2005 Document Revised: 07/30/2013 Document Reviewed: 04/03/2013 [facility] Patient Information 2015 [place], [place]. This information is not intended to replace advice given to you by your health care provider. Make sure you discuss any questions you have with your health care provider.   Understand these instructions.  Will watch your condition.  Will get help right away if you are not doing well or get worse. Document Released: 05/22/2007 Document Revised: 10/17/2011 Document Reviewed: 05/22/2007 [facility] Patient Information 2015 [place], [place]. This information is not intended to replace advice given to you by your health care provider. Make sure you discuss any questions you have with your health care provider.

## 2014-06-26 NOTE — ED Notes (Signed)
Pt. reports right leg pain onset last week , denies injury / ambulatory , also stated upper abdominal ;pain with mild nausea , no emesis / denies diarrhea.

## 2014-06-26 NOTE — ED Provider Notes (Signed)
CSN: [id number]     Arrival date & time 06/26/14  0157 History   First MD Initiated Contact with Patient 06/26/14 0350     Chief Complaint  Patient presents with  . Leg Pain  . Abdominal Pain     (Consider location/radiation/quality/duration/timing/severity/associated sxs/prior Treatment) HPI 38 year old female presents to the emergency department with complaint of intermittent pain to her right knee and lower leg for 1 week, and upper abdominal pain with nausea and vomiting starting this [facility]  Patient denies any trauma to her right lower leg, no history of same.  She reports that she has been standing and more active recently due to taking custody of her nieces, [id number]-month-old child.  Pain is better after Percocet given earlier in her visit.  Pain is mainly on the outside of her kneecap along the joint line and down the front of her shin.  Patient reports abdominal pain is not better with Percocet, however.  She reports persistent nausea despite Zofran.  Patient has history of abnormal height is in the past, no gallstones.  She was referred to a surgeon, but never followed up.  She reports that she has a new primary care doctor that she has an appointment with next month for physical exam and new referrals.  She reports from time to time she will have pain in her abdomen with certain foods.  She denies any fever or chills.  No diarrhea Past Medical History  Diagnosis Date  . Neuropathy   . Bipolar disorder   . GERD (gastroesophageal reflux disease)   . Depression   . Asthma   . Sleep apnea    Past Surgical History  Procedure Laterality Date  . Cardiac surgery    . Tubal ligation    . Appendectomy     No family history on file. History  Substance Use Topics  . Smoking status: Current Every Day Smoker -- 0.50 packs/day    Types: Cigarettes  . Smokeless tobacco: Not on file  . Alcohol Use: No   OB History    No data available     Review of  Systems  See History of Present Illness; otherwise all other systems are reviewed and negative   Allergies  Cephalexin; Ciprofloxacin; Morphine and related; Trazodone and nefazodone; and Ketorolac tromethamine  Home Medications   Prior to Admission medications   Medication Sig Start Date End Date Taking? Authorizing Provider  albuterol (PROAIR HFA) 108 (90 BASE) MCG/ACT inhaler Inhale 2 puffs into the lungs every 6 (six) hours as needed for wheezing or shortness of breath.   Yes Historical Provider, MD  gabapentin (NEURONTIN) 300 MG capsule Take 300 mg by mouth at bedtime.   Yes Historical Provider, MD  ibuprofen (ADVIL,MOTRIN) 200 MG tablet Take 200-800 mg by mouth every 6 (six) hours as needed for headache.   Yes Historical Provider, MD  omeprazole (PRILOSEC) 40 MG capsule Take 40 mg by mouth daily.   Yes Historical Provider, MD   BP 146/79 mmHg  Pulse 106  Temp(Src) 97.8 F (36.6 C) (Oral)  Resp 18  Wt 272 lb (123.378 kg)  SpO2 96% Physical Exam  Constitutional: She is oriented to person, place, and time. She appears well-developed and well-nourished. No distress.  HENT:  Head: Normocephalic and atraumatic.  Nose: Nose normal.  Mouth/Throat: Oropharynx is clear and moist.  Eyes: Conjunctivae and EOM are normal. Pupils are equal, round, and reactive to light.  Neck: Normal range of motion. Neck supple.  No JVD present. No tracheal deviation present. No thyromegaly present.  Cardiovascular: Normal rate, regular rhythm, normal heart sounds and intact distal pulses.  Exam reveals no gallop and no friction rub.   No murmur heard. Pulmonary/Chest: Effort normal and breath sounds normal. No stridor. No respiratory distress. She has no wheezes. She has no rales. She exhibits no tenderness.  Abdominal: Soft. Bowel sounds are normal. She exhibits no distension and no mass. There is tenderness (patient has tenderness in epigastrium and right upper quadrant without positive Murphy sign,  rebound or guarding). There is no rebound and no guarding.  Musculoskeletal: Normal range of motion. She exhibits tenderness (patient has tenderness with palpation to the lateral joint line of her right knee.  There is no effusion, no patellar tenderness, no crepitus.  Patient has mild tenderness to the anterior tibia on the right, again with no effusion.). She exhibits no edema.  Patient has normal range of motion of hip, knee and ankle.  No overlying skin changes.  Patient has negative anterior and posterior drawer testing of the right knee.  She has pain laterally with both varus and valgus stress  Lymphadenopathy:    She has no cervical adenopathy.  Neurological: She is alert and oriented to person, place, and time. She displays normal reflexes. She exhibits normal muscle tone. Coordination normal.  Skin: Skin is warm and dry. No rash noted. No erythema. No pallor.  Psychiatric: She has a normal mood and affect. Her behavior is normal. Judgment and thought content normal.  Nursing note and vitals reviewed.   ED Course  Procedures (including critical care time) Labs Review Labs Reviewed  CBC WITH DIFFERENTIAL - Abnormal; Notable for the following:    WBC 14.5 (*)    Neutro Abs 8.6 (*)    Lymphs Abs 4.5 (*)    All other components within normal limits  COMPREHENSIVE METABOLIC PANEL - Abnormal; Notable for the following:    Potassium 3.6 (*)    Total Bilirubin 0.2 (*)    GFR calc non Af Amer 64 (*)    GFR calc Af Amer 75 (*)    All other components within normal limits  URINALYSIS, ROUTINE W REFLEX MICROSCOPIC - Abnormal; Notable for the following:    APPearance CLOUDY (*)    Hgb urine dipstick LARGE (*)    All other components within normal limits  URINE MICROSCOPIC-ADD ON - Abnormal; Notable for the following:    Squamous Epithelial / LPF FEW (*)    All other components within normal limits  PREGNANCY, URINE    Imaging Review No results found.   EKG Interpretation None       MDM   Final diagnoses:  Pain of upper abdomen  Knee pain, right    38 year old female with right knee and shin pain as well as right upper quadrant pain.  Patient has known gallbladder dysfunction.  Labs show slightly elevated white blood cell count of 14.5, and blood in urine.  Patient reports she has been told in the past that she has hematuria.  She has been told that she has stage III kidney failure, which is not reflected on today's labs.  Patient feeling better in the extremity with Percocet.  We'll try Levsin for cramping in her right upper quadrant along with some Phenergan and she is still having some nausea.  Patient appears to have good follow-up with a new primary care doctor who is going to put in the appropriate consults for her.  I will  give her a consult to orthopedics if her knee pain does not improve with conservative measures.    Olivia Mackielga M Morrisa Aldaba, MD 06/26/14 [phone]

## 2014-07-07 ENCOUNTER — Encounter (HOSPITAL_COMMUNITY): Payer: Self-pay | Admitting: Emergency Medicine

## 2014-07-07 ENCOUNTER — Emergency Department (HOSPITAL_COMMUNITY)
Admission: EM | Admit: 2014-07-07 | Discharge: 2014-07-08 | Disposition: A | Discharge status: Home / Self Care | Payer: Medicare Other | Attending: Emergency Medicine | Admitting: Emergency Medicine

## 2014-07-07 ENCOUNTER — Emergency Department (HOSPITAL_COMMUNITY)
Admission: EM | Admit: 2014-07-07 | Discharge: 2014-07-07 | Discharge status: Against Medical Advice | Payer: Medicare Other | Source: Home / Self Care

## 2014-07-07 DIAGNOSIS — R1010 Upper abdominal pain, unspecified: Secondary | ICD-10-CM | POA: Diagnosis not present

## 2014-07-07 DIAGNOSIS — Z9049 Acquired absence of other specified parts of digestive tract: Secondary | ICD-10-CM | POA: Diagnosis not present

## 2014-07-07 DIAGNOSIS — K219 Gastro-esophageal reflux disease without esophagitis: Secondary | ICD-10-CM | POA: Insufficient documentation

## 2014-07-07 DIAGNOSIS — Z720 Tobacco use: Secondary | ICD-10-CM | POA: Diagnosis not present

## 2014-07-07 DIAGNOSIS — Z79899 Other long term (current) drug therapy: Secondary | ICD-10-CM | POA: Diagnosis not present

## 2014-07-07 DIAGNOSIS — R1011 Right upper quadrant pain: Secondary | ICD-10-CM | POA: Insufficient documentation

## 2014-07-07 DIAGNOSIS — Z9889 Other specified postprocedural states: Secondary | ICD-10-CM | POA: Insufficient documentation

## 2014-07-07 DIAGNOSIS — R112 Nausea with vomiting, unspecified: Secondary | ICD-10-CM | POA: Insufficient documentation

## 2014-07-07 DIAGNOSIS — Z9851 Tubal ligation status: Secondary | ICD-10-CM | POA: Insufficient documentation

## 2014-07-07 DIAGNOSIS — R5383 Other fatigue: Secondary | ICD-10-CM | POA: Insufficient documentation

## 2014-07-07 DIAGNOSIS — Z3202 Encounter for pregnancy test, result negative: Secondary | ICD-10-CM | POA: Diagnosis not present

## 2014-07-07 DIAGNOSIS — J45909 Unspecified asthma, uncomplicated: Secondary | ICD-10-CM | POA: Insufficient documentation

## 2014-07-07 DIAGNOSIS — G629 Polyneuropathy, unspecified: Secondary | ICD-10-CM | POA: Insufficient documentation

## 2014-07-07 DIAGNOSIS — F319 Bipolar disorder, unspecified: Secondary | ICD-10-CM | POA: Diagnosis not present

## 2014-07-07 MED ORDER — FENTANYL CITRATE 0.05 MG/ML IJ SOLN
50.0000 ug | Freq: Once | INTRAMUSCULAR | Status: AC
  Administered 2014-07-08: via INTRAVENOUS
  Filled 2014-07-07: qty 2

## 2014-07-07 MED ORDER — ONDANSETRON HCL 4 MG/2ML IJ SOLN
4.0000 mg | Freq: Once | INTRAMUSCULAR | Status: AC
  Administered 2014-07-08: 4 mg via INTRAVENOUS
  Filled 2014-07-07: qty 2

## 2014-07-07 NOTE — ED Notes (Signed)
Pt states she has been vomiting for the past 3 days  Pt states yesterday she started having pain in her right upper quadrant that she states is a sharp pain that feels like someone is squeezing her  Pt states today she has had periods of dizziness where she feels like she is going to pass out

## 2014-07-07 NOTE — ED Notes (Signed)
Pt. reports RUQ pain with nausea and vomitting onset yesterday with fatigue , denies diarrhea , no fever or chills.

## 2014-07-08 ENCOUNTER — Emergency Department (HOSPITAL_COMMUNITY): Payer: Medicare Other

## 2014-07-08 DIAGNOSIS — R1011 Right upper quadrant pain: Secondary | ICD-10-CM | POA: Diagnosis not present

## 2014-07-08 LAB — URINALYSIS, ROUTINE W REFLEX MICROSCOPIC
BILIRUBIN URINE: NEGATIVE
Glucose, UA: NEGATIVE mg/dL
KETONES UR: NEGATIVE mg/dL
NITRITE: NEGATIVE
PH: 6 (ref 5.0–8.0)
PROTEIN: NEGATIVE mg/dL
Specific Gravity, Urine: 1.019 (ref 1.005–1.030)
UROBILINOGEN UA: 1 mg/dL (ref 0.0–1.0)

## 2014-07-08 LAB — URINE MICROSCOPIC-ADD ON

## 2014-07-08 LAB — COMPREHENSIVE METABOLIC PANEL
ALT: 17 U/L (ref 0–35)
ANION GAP: 12 (ref 5–15)
AST: 15 U/L (ref 0–37)
Albumin: 3.5 g/dL (ref 3.5–5.2)
Alkaline Phosphatase: 85 U/L (ref 39–117)
BUN: 12 mg/dL (ref 6–23)
CO2: 25 meq/L (ref 19–32)
Calcium: 9.1 mg/dL (ref 8.4–10.5)
Chloride: 104 mEq/L (ref 96–112)
Creatinine, Ser: 1.03 mg/dL (ref 0.50–1.10)
GFR, EST AFRICAN AMERICAN: 80 mL/min — AB (ref >90)
GFR, EST NON AFRICAN AMERICAN: 69 mL/min — AB (ref >90)
GLUCOSE: 97 mg/dL (ref 70–99)
Potassium: 3.8 mEq/L (ref 3.7–5.3)
Sodium: 141 mEq/L (ref 137–147)
Total Bilirubin: <0.2 mg/dL — ABNORMAL LOW (ref 0.3–1.2)
Total Protein: 7.5 g/dL (ref 6.0–8.3)

## 2014-07-08 LAB — CBC WITH DIFFERENTIAL/PLATELET
Basophils Absolute: 0 10*3/uL (ref 0.0–0.1)
Basophils Relative: 0 % (ref 0–1)
EOS ABS: 0.4 10*3/uL (ref 0.0–0.7)
EOS PCT: 3 % (ref 0–5)
HCT: 38.8 % (ref 36.0–46.0)
HEMOGLOBIN: 13.1 g/dL (ref 12.0–15.0)
LYMPHS ABS: 4.5 10*3/uL — AB (ref 0.7–4.0)
Lymphocytes Relative: 28 % (ref 12–46)
MCH: 31.8 pg (ref 26.0–34.0)
MCHC: 33.8 g/dL (ref 30.0–36.0)
MCV: 94.2 fL (ref 78.0–100.0)
MONOS PCT: 5 % (ref 3–12)
Monocytes Absolute: 0.1 10*3/uL (ref 0.1–1.0)
Neutro Abs: 10.2 10*3/uL — ABNORMAL HIGH (ref 1.7–7.7)
Neutrophils Relative %: 64 % (ref 43–77)
Platelets: 331 10*3/uL (ref 150–400)
RBC: 4.12 MIL/uL (ref 3.87–5.11)
RDW: 14 % (ref 11.5–15.5)
WBC: 16 10*3/uL — ABNORMAL HIGH (ref 4.0–10.5)
nRBC: 0 /100 WBC

## 2014-07-08 LAB — POC URINE PREG, ED: PREG TEST UR: NEGATIVE

## 2014-07-08 LAB — LIPASE, BLOOD: LIPASE: 16 U/L (ref 11–59)

## 2014-07-08 MED ORDER — DICYCLOMINE HCL 20 MG PO TABS: 20.0000 mg | ORAL_TABLET | Freq: Four times a day (QID) | ORAL | Status: DC | PRN

## 2014-07-08 MED ORDER — SODIUM CHLORIDE 0.9 % IV BOLUS (SEPSIS)
1000.0000 mL | Freq: Once | INTRAVENOUS | Status: AC
Start: 2014-07-08 — End: 2014-07-08
  Administered 2014-07-08: 1000 mL via INTRAVENOUS

## 2014-07-08 MED ORDER — IOHEXOL 300 MG/ML  SOLN
100.0000 mL | Freq: Once | INTRAMUSCULAR | Status: AC | PRN
  Administered 2014-07-08: 100 mL via INTRAVENOUS

## 2014-07-08 MED ORDER — IOHEXOL 300 MG/ML  SOLN
50.0000 mL | Freq: Once | INTRAMUSCULAR | Status: AC | PRN
  Administered 2014-07-08: 50 mL via ORAL

## 2014-07-08 MED ORDER — DICYCLOMINE HCL 20 MG PO TABS
20.0000 mg | ORAL_TABLET | Freq: Once | ORAL | Status: AC
  Administered 2014-07-08: 20 mg via ORAL
  Filled 2014-07-08: qty 1

## 2014-07-08 MED ORDER — ONDANSETRON 8 MG PO TBDP: 8.0000 mg | ORAL_TABLET | Freq: Three times a day (TID) | ORAL | Status: DC | PRN

## 2014-07-08 NOTE — ED Provider Notes (Signed)
CSN: [id number]     Arrival date & time 07/07/14  2243 History   First MD Initiated Contact with Patient 07/07/14 2334     Chief Complaint  Patient presents with  . Abdominal Pain  . Emesis     (Consider location/radiation/quality/duration/timing/severity/associated sxs/prior Treatment) HPI 38 year old female presents to the emergency department with complaint of 3 days of intermittent vomiting and one day of right upper quadrant abdominal pain.  Patient reports she's had problems with gallbladder dysfunction, is awaiting of follow-up appointment with a primary care Dr. for outpatient hiatus scan.  Last ultrasound done in 2012.  Patient seen by me 2 weeks ago with similar complaints, she reports these and helps some but the Levsin is not helping.  No fevers or chills.  No abdominal pain elsewhere.  No vaginal or urinary complaints.  Patient reports that at times she feels dizzy as though she is going to pass out. Past Medical History  Diagnosis Date  . Neuropathy   . Bipolar disorder   . GERD (gastroesophageal reflux disease)   . Depression   . Asthma   . Sleep apnea    Past Surgical History  Procedure Laterality Date  . Cardiac surgery    . Tubal ligation    . Appendectomy     Family History  Problem Relation Age of Onset  . Diabetes Other   . Hyperlipidemia Other   . Hypertension Other   . Stroke Other   . Asthma Other   . Cancer Other    History  Substance Use Topics  . Smoking status: Current Every Day Smoker -- 0.50 packs/day    Types: Cigarettes  . Smokeless tobacco: Not on file  . Alcohol Use: No   OB History    No data available     Review of Systems  See History of Present Illness; otherwise all other systems are reviewed and negative   Allergies  Cephalexin; Ciprofloxacin; Morphine and related; Trazodone and nefazodone; and Ketorolac tromethamine  Home Medications   Prior to Admission medications   Medication Sig Start Date End Date Taking?  Authorizing Provider  albuterol (PROAIR HFA) 108 (90 BASE) MCG/ACT inhaler Inhale 2 puffs into the lungs every 6 (six) hours as needed for wheezing or shortness of breath.    Historical Provider, MD  gabapentin (NEURONTIN) 300 MG capsule Take 300 mg by mouth at bedtime.    Historical Provider, MD  hyoscyamine (LEVSIN/SL) 0.125 MG SL tablet Place 1 tablet (0.125 mg total) under the tongue every 4 (four) hours as needed for cramping. 06/26/14   Olivia Mackielga M Graycie Halley, MD  ibuprofen (ADVIL,MOTRIN) 200 MG tablet Take 200-800 mg by mouth every 6 (six) hours as needed for headache.    Historical Provider, MD  omeprazole (PRILOSEC) 40 MG capsule Take 40 mg by mouth daily.    Historical Provider, MD  ondansetron (ZOFRAN ODT) 8 MG disintegrating tablet Take 1 tablet (8 mg total) by mouth every 8 (eight) hours as needed for nausea or vomiting. 06/26/14   Olivia Mackielga M Sabin Gibeault, MD  oxyCODONE-acetaminophen (PERCOCET/ROXICET) 5-325 MG per tablet Take 2 tablets by mouth every 4 (four) hours as needed for severe pain. 06/26/14   Olivia Mackielga M Kirill Chatterjee, MD   BP 118/74 mmHg  Pulse 45  Temp(Src) 97.7 F (36.5 C) (Oral)  Resp 16  SpO2 95%  LMP 06/30/2014 (Approximate) Physical Exam  Constitutional: She is oriented to person, place, and time. She appears well-developed and well-nourished. She appears distressed.  HENT:  Head: Normocephalic and  atraumatic.  Nose: Nose normal.  Mouth/Throat: Oropharynx is clear and moist.  Eyes: Conjunctivae and EOM are normal. Pupils are equal, round, and reactive to light.  Neck: Normal range of motion. Neck supple. No JVD present. No tracheal deviation present. No thyromegaly present.  Cardiovascular: Normal rate, regular rhythm, normal heart sounds and intact distal pulses.  Exam reveals no gallop and no friction rub.   No murmur heard. Pulmonary/Chest: Effort normal and breath sounds normal. No stridor. No respiratory distress. She has no wheezes. She has no rales. She exhibits no tenderness.   Abdominal: Soft. Bowel sounds are normal. She exhibits no distension and no mass. There is tenderness (tenderness in right upper quadrant without rebound or guarding). There is no rebound and no guarding.  Musculoskeletal: Normal range of motion. She exhibits no edema or tenderness.  Lymphadenopathy:    She has no cervical adenopathy.  Neurological: She is alert and oriented to person, place, and time. She displays normal reflexes. She exhibits normal muscle tone. Coordination normal.  Skin: Skin is warm and dry. No rash noted. No erythema. No pallor.  Psychiatric: She has a normal mood and affect. Her behavior is normal. Judgment and thought content normal.  Nursing note and vitals reviewed.   ED Course  Procedures (including critical care time) Labs Review Labs Reviewed  CBC WITH DIFFERENTIAL - Abnormal; Notable for the following:    WBC 16.0 (*)    Neutro Abs 10.2 (*)    Lymphs Abs 4.5 (*)    All other components within normal limits  COMPREHENSIVE METABOLIC PANEL - Abnormal; Notable for the following:    Total Bilirubin <0.2 (*)    GFR calc non Af Amer 69 (*)    GFR calc Af Amer 80 (*)    All other components within normal limits  URINALYSIS, ROUTINE W REFLEX MICROSCOPIC - Abnormal; Notable for the following:    APPearance CLOUDY (*)    Hgb urine dipstick MODERATE (*)    Leukocytes, UA TRACE (*)    All other components within normal limits  URINE MICROSCOPIC-ADD ON - Abnormal; Notable for the following:    Squamous Epithelial / LPF FEW (*)    Bacteria, UA FEW (*)    All other components within normal limits  LIPASE, BLOOD  POC URINE PREG, ED    Imaging Review Ct Abdomen Pelvis W Contrast  07/08/2014   CLINICAL DATA:  Acute onset of nausea and vomiting for 3 days. Right upper quadrant abdominal pain. Leukocytosis. Red blood cells and white blood cells in the urine. Initial encounter.  EXAM: CT ABDOMEN AND PELVIS WITH CONTRAST  TECHNIQUE: Multidetector CT imaging of the  abdomen and pelvis was performed using the standard protocol following bolus administration of intravenous contrast.  CONTRAST:  OMNIPAQUE IOHEXOL 300 MG/ML  SOLN  COMPARISON:  CT of the abdomen and pelvis performed 01/28/2010, and abdominal ultrasound performed 05/11/2011  FINDINGS: The visualized lung bases are clear.  The liver and spleen are unremarkable in appearance. The gallbladder is within normal limits. The pancreas and adrenal glands are unremarkable.  A 2 mm nonobstructing stone is noted at the lower pole of the right kidney. The kidneys are otherwise unremarkable. There is no evidence of hydronephrosis. No obstructing ureteral stones are seen. No perinephric stranding is appreciated.  No free fluid is identified. The small bowel is unremarkable in appearance. The stomach is within normal limits. No acute vascular abnormalities are seen.  The patient is status post appendectomy. The colon is  unremarkable in appearance.  The bladder is mildly distended and grossly unremarkable. The uterus is unremarkable in appearance. The ovaries are mildly asymmetric, with a small apparent right-sided follicle seen. No suspicious adnexal masses are seen. No inguinal lymphadenopathy is seen.  No acute osseous abnormalities are identified.  IMPRESSION: 1. No acute abnormality seen within the abdomen or pelvis. 2. 2 mm nonobstructing stone at the lower pole of the right kidney.   Electronically Signed   By: Roanna RaiderJeffery  Chang M.D.   On: 07/08/2014 05:00   [place] Abdomen Limited Ruq  07/08/2014   CLINICAL DATA:  38 year old female with right upper quadrant abdominal pain. Initial encounter.  EXAM: US ABDOMEN LIMITED - RIGHT UPPER QUADRANT  COMPARISON:  None.  FINDINGS: Gallbladder:  The gallbladder is unremarkable. There is no evidence of cholelithiasis or acute cholecystitis.  Common bile duct:  Diameter: 4.2 mm. There is no evidence of intrahepatic or extrahepatic biliary dilatation.  Liver:  The liver is unremarkable.  Normal echogenicity identified. No focal hepatic abnormalities identified.  There is no evidence of free fluid within the right upper abdomen.  IMPRESSION: Normal right upper quadrant abdominal ultrasound.   Electronically Signed   By: Laveda AbbeJeff  Hu M.D.   On: 07/08/2014 00:55     EKG Interpretation None      MDM   Final diagnoses:  RUQ abdominal pain    38 year old female with nausea, vomiting, right upper quadrant pain.  It is been 3 years since her last ultrasound which did not show any signs of cholelithiasis.  Will repeat, given increasing symptoms.  5:07 AM U/s unremarkable.  Elevated wbc noted, still having pain.   CT abd pelvis ordered for further workup, no acute findings.  No further vomiting.  Will d/c to f/u with pcm for hida scan outpatient.  Olivia Mackielga M Jacarri Gesner, MD 07/08/14 [id number]

## 2014-07-08 NOTE — ED Notes (Addendum)
RN went to discharge pt not in room and pt IV lying on bed with catheter intact. Charge nurse and MD aware.

## 2014-07-08 NOTE — Discharge Instructions (Signed)
Abdominal Pain °Many things can cause abdominal pain. Usually, abdominal pain is not caused by a disease and will improve without treatment. It can often be observed and treated at home. Your health care provider will do a physical exam and possibly order blood tests and X-rays to help determine the seriousness of your pain. However, in many cases, more time must pass before a clear cause of the pain can be found. Before that point, your health care provider may not know if you need more testing or further treatment. °HOME CARE INSTRUCTIONS  °Monitor your abdominal pain for any changes. The following actions may help to alleviate any discomfort you are experiencing: °· Only take over-the-counter or prescription medicines as directed by your health care provider. °· Do not take laxatives unless directed to do so by your health care provider. °· Try a clear liquid diet (broth, tea, or water) as directed by your health care provider. Slowly move to a bland diet as tolerated. °SEEK MEDICAL CARE IF: °· You have unexplained abdominal pain. °· You have abdominal pain associated with nausea or diarrhea. °· You have pain when you urinate or have a bowel movement. °· You experience abdominal pain that wakes you in the night. °· You have abdominal pain that is worsened or improved by eating food. °· You have abdominal pain that is worsened with eating fatty foods. °· You have a fever. °SEEK IMMEDIATE MEDICAL CARE IF:  °· Your pain does not go away within 2 hours. °· You keep throwing up (vomiting). °· Your pain is felt only in portions of the abdomen, such as the right side or the left lower portion of the abdomen. °· You pass bloody or black tarry stools. °MAKE SURE YOU: °· Understand these instructions.   °· Will watch your condition.   °· Will get help right away if you are not doing well or get worse.   °Document Released: 05/04/2005 Document Revised: 07/30/2013 Document Reviewed: 04/03/2013 °Exit[employer]® Patient Information  ©2015 [employer]. This information is not intended to replace advice given to you by your health care provider. Make sure you discuss any questions you have with your health care provider. ° °Nausea and Vomiting °Nausea is a sick feeling that often comes before throwing up (vomiting). Vomiting is a reflex where stomach contents come out of your mouth. Vomiting can cause severe loss of body fluids (dehydration). Children and elderly adults can become dehydrated quickly, especially if they also have diarrhea. Nausea and vomiting are symptoms of a condition or disease. It is important to find the cause of your symptoms. °CAUSES  °· Direct irritation of the stomach lining. This irritation can result from increased acid production (gastroesophageal reflux disease), infection, food poisoning, taking certain medicines (such as nonsteroidal anti-inflammatory drugs), alcohol use, or tobacco use. °· Signals from the brain. These signals could be caused by a headache, heat exposure, an inner ear disturbance, increased pressure in the brain from injury, infection, a tumor, or a concussion, pain, emotional stimulus, or metabolic problems. °· An obstruction in the gastrointestinal tract (bowel obstruction). °· Illnesses such as diabetes, hepatitis, gallbladder problems, appendicitis, kidney problems, cancer, sepsis, atypical symptoms of a heart attack, or eating disorders. °· Medical treatments such as chemotherapy and radiation. °· Receiving medicine that makes you sleep (general anesthetic) during surgery. °DIAGNOSIS °Your caregiver may ask for tests to be done if the problems do not improve after a few days. Tests may also be done if symptoms are severe or if the reason for the nausea   and vomiting is not clear. Tests may include:  Urine tests.  Blood tests.  Stool tests.  Cultures (to look for evidence of infection).  X-rays or other imaging studies. Test results can help your caregiver make decisions about  treatment or the need for additional tests. TREATMENT You need to stay well hydrated. Drink frequently but in small amounts.You may wish to drink water, sports drinks, clear broth, or eat frozen ice pops or gelatin dessert to help stay hydrated.When you eat, eating slowly may help prevent nausea.There are also some antinausea medicines that may help prevent nausea. HOME CARE INSTRUCTIONS   Take all medicine as directed by your caregiver.  If you do not have an appetite, do not force yourself to eat. However, you must continue to drink fluids.  If you have an appetite, eat a normal diet unless your caregiver tells you differently.  Eat a variety of complex carbohydrates (rice, wheat, potatoes, bread), lean meats, yogurt, fruits, and vegetables.  Avoid high-fat foods because they are more difficult to digest.  Drink enough water and fluids to keep your urine clear or pale yellow.  If you are dehydrated, ask your caregiver for specific rehydration instructions. Signs of dehydration may include:  Severe thirst.  Dry lips and mouth.  Dizziness.  Dark urine.  Decreasing urine frequency and amount.  Confusion.  Rapid breathing or pulse. SEEK IMMEDIATE MEDICAL CARE IF:   You have blood or brown flecks (like coffee grounds) in your vomit.  You have black or bloody stools.  You have a severe headache or stiff neck.  You are confused.  You have severe abdominal pain.  You have chest pain or trouble breathing.  You do not urinate at least once every 8 hours.  You develop cold or clammy skin.  You continue to vomit for longer than 24 to 48 hours.  You have a fever. MAKE SURE YOU:   Understand these instructions.  Will watch your condition.  Will get help right away if you are not doing well or get worse. Document Released: 07/25/2005 Document Revised: 10/17/2011 Document Reviewed: 12/22/2010 [facility]ExitCare Patient Information 2015 [place], [place]. This information is not  intended to replace advice given to you by your health care provider. Make sure you discuss any questions you have with your health care provider.  Pain of Unknown Etiology (Pain Without a Known Cause) You have come to your caregiver because of pain. Pain can occur in any part of the body. Often there is not a definite cause. If your laboratory (blood or urine) work was normal and X-rays or other studies were normal, your caregiver may treat you without knowing the cause of the pain. An example of this is the headache. Most headaches are diagnosed by taking a history. This means your caregiver asks you questions about your headaches. Your caregiver determines a treatment based on your answers. Usually testing done for headaches is normal. Often testing is not done unless there is no response to medications. Regardless of where your pain is located today, you can be given medications to make you comfortable. If no physical cause of pain can be found, most cases of pain will gradually leave as suddenly as they came.  If you have a painful condition and no reason can be found for the pain, it is important that you follow up with your caregiver. If the pain becomes worse or does not go away, it may be necessary to repeat tests and look further for a possible cause.  Only take over-the-counter or prescription medicines for pain, discomfort, or fever as directed by your caregiver.  For the protection of your privacy, test results cannot be given over the phone. Make sure you receive the results of your test. Ask how these results are to be obtained if you have not been informed. It is your responsibility to obtain your test results.  You may continue all activities unless the activities cause more pain. When the pain lessens, it is important to gradually resume normal activities. Resume activities by beginning slowly and gradually increasing the intensity and duration of the activities or exercise. During  periods of severe pain, bed rest may be helpful. Lie or sit in any position that is comfortable.  Ice used for acute (sudden) conditions may be effective. Use a large plastic bag filled with ice and wrapped in a towel. This may provide pain relief.  See your caregiver for continued problems. Your caregiver can help or refer you for exercises or physical therapy if necessary. If you were given medications for your condition, do not drive, operate machinery or power tools, or sign legal documents for 24 hours. Do not drink alcohol, take sleeping pills, or take other medications that may interfere with treatment. See your caregiver immediately if you have pain that is becoming worse and not relieved by medications. Document Released: 04/19/2001 Document Revised: 05/15/2013 Document Reviewed: 07/25/2005 [facility]Exit[facility] Patient Information 2015 [place]Care, [place]LL[place]. This information is not intended to replace advice given to you by your health care provider. Make sure you discuss any questions you have with your health care provider.

## 2014-10-18 ENCOUNTER — Emergency Department (HOSPITAL_BASED_OUTPATIENT_CLINIC_OR_DEPARTMENT_OTHER): Payer: Medicare Other

## 2014-10-18 ENCOUNTER — Emergency Department (HOSPITAL_BASED_OUTPATIENT_CLINIC_OR_DEPARTMENT_OTHER)
Admission: EM | Admit: 2014-10-18 | Discharge: 2014-10-19 | Disposition: A | Discharge status: Home / Self Care | Payer: Medicare Other | Attending: Emergency Medicine | Admitting: Emergency Medicine

## 2014-10-18 ENCOUNTER — Encounter (HOSPITAL_BASED_OUTPATIENT_CLINIC_OR_DEPARTMENT_OTHER): Payer: Self-pay

## 2014-10-18 DIAGNOSIS — Z79899 Other long term (current) drug therapy: Secondary | ICD-10-CM | POA: Diagnosis not present

## 2014-10-18 DIAGNOSIS — F319 Bipolar disorder, unspecified: Secondary | ICD-10-CM | POA: Insufficient documentation

## 2014-10-18 DIAGNOSIS — K828 Other specified diseases of gallbladder: Secondary | ICD-10-CM | POA: Diagnosis not present

## 2014-10-18 DIAGNOSIS — Z3202 Encounter for pregnancy test, result negative: Secondary | ICD-10-CM | POA: Diagnosis not present

## 2014-10-18 DIAGNOSIS — Z9851 Tubal ligation status: Secondary | ICD-10-CM | POA: Insufficient documentation

## 2014-10-18 DIAGNOSIS — G629 Polyneuropathy, unspecified: Secondary | ICD-10-CM | POA: Insufficient documentation

## 2014-10-18 DIAGNOSIS — Z720 Tobacco use: Secondary | ICD-10-CM | POA: Insufficient documentation

## 2014-10-18 DIAGNOSIS — K219 Gastro-esophageal reflux disease without esophagitis: Secondary | ICD-10-CM | POA: Insufficient documentation

## 2014-10-18 DIAGNOSIS — Z9049 Acquired absence of other specified parts of digestive tract: Secondary | ICD-10-CM | POA: Diagnosis not present

## 2014-10-18 DIAGNOSIS — J45909 Unspecified asthma, uncomplicated: Secondary | ICD-10-CM | POA: Insufficient documentation

## 2014-10-18 DIAGNOSIS — R1011 Right upper quadrant pain: Secondary | ICD-10-CM | POA: Diagnosis present

## 2014-10-18 LAB — URINE MICROSCOPIC-ADD ON

## 2014-10-18 LAB — URINALYSIS, ROUTINE W REFLEX MICROSCOPIC
Bilirubin Urine: NEGATIVE
Glucose, UA: NEGATIVE mg/dL
KETONES UR: NEGATIVE mg/dL
LEUKOCYTES UA: NEGATIVE
Nitrite: NEGATIVE
PH: 6.5 (ref 5.0–8.0)
Protein, ur: NEGATIVE mg/dL
SPECIFIC GRAVITY, URINE: 1.014 (ref 1.005–1.030)
UROBILINOGEN UA: 0.2 mg/dL (ref 0.0–1.0)

## 2014-10-18 LAB — PREGNANCY, URINE: Preg Test, Ur: NEGATIVE

## 2014-10-18 MED ORDER — ONDANSETRON HCL 4 MG/2ML IJ SOLN
4.0000 mg | Freq: Once | INTRAMUSCULAR | Status: AC
  Administered 2014-10-18: 4 mg via INTRAVENOUS
  Filled 2014-10-18: qty 2

## 2014-10-18 MED ORDER — SODIUM CHLORIDE 0.9 % IV SOLN
INTRAVENOUS | Status: DC
  Administered 2014-10-18: via INTRAVENOUS

## 2014-10-18 MED ORDER — FENTANYL CITRATE 0.05 MG/ML IJ SOLN
100.0000 ug | Freq: Once | INTRAMUSCULAR | Status: AC
  Administered 2014-10-18: 100 ug via INTRAVENOUS
  Filled 2014-10-18: qty 2

## 2014-10-18 NOTE — ED Provider Notes (Signed)
CSN: [id number]     Arrival date & time 10/18/14  2023 History  This chart was scribed for [name], MD by Modena Jansky, ED Scribe. This patient was seen in room MH09/MH09 and the patient's care was started at 10:59 PM.   Chief Complaint  Patient presents with  . Abdominal Pain   The history is provided by the patient. No language interpreter was used.   HPI Comments: Robin Frey is a 39 y.o. female who presents to the Emergency Department complaining of right upper quadrant pain that started yesterday evening. She states that pain worsened after she ate today at 2:15 PM. She characterizes the pain as a stabbing sensation and currently rates it a 9/10 in severity. She reports associated nausea, chills, and 3 episodes of vomiting. She states that she has prior hx of abdominal pain, but today's episode is the most severe. She reports that her legs have been swelling the past 2 days. She denies any diarrhea or fever.   Pt has a hx of "gallbadder malfunction" per HIDA scan (not performed in a [facility] facility). Normal CT and ultrasound of the abdomen in December, 2015.   Past Medical History  Diagnosis Date  . Neuropathy   . Bipolar disorder   . GERD (gastroesophageal reflux disease)   . Depression   . Asthma   . Sleep apnea    Past Surgical History  Procedure Laterality Date  . Cardiac surgery    . Tubal ligation    . Appendectomy     Family History  Problem Relation Age of Onset  . Diabetes Other   . Hyperlipidemia Other   . Hypertension Other   . Stroke Other   . Asthma Other   . Cancer Other    History  Substance Use Topics  . Smoking status: Light Tobacco Smoker -- 0.25 packs/day    Types: Cigarettes  . Smokeless tobacco: Not on file  . Alcohol Use: No   OB History    No data available     Review of Systems A complete 10 system review of systems was obtained and all systems are negative except as noted in the HPI and PMH.   Allergies  Cephalexin;  Ciprofloxacin; Morphine and related; Trazodone and nefazodone; and Ketorolac tromethamine  Home Medications   Prior to Admission medications   Medication Sig Start Date End Date Taking? Authorizing Provider  albuterol (PROAIR HFA) 108 (90 BASE) MCG/ACT inhaler Inhale 2 puffs into the lungs every 6 (six) hours as needed for wheezing or shortness of breath.    Historical Provider, MD  dicyclomine (BENTYL) 20 MG tablet Take 1 tablet (20 mg total) by mouth every 6 (six) hours as needed for spasms (for abdominal cramping). 07/08/14   Marisa Severin, MD  FLUoxetine (PROZAC) 20 MG tablet Take 20 mg by mouth daily.    Historical Provider, MD  gabapentin (NEURONTIN) 300 MG capsule Take 300 mg by mouth at bedtime.    Historical Provider, MD  omeprazole (PRILOSEC) 40 MG capsule Take 40 mg by mouth daily.    Historical Provider, MD  ondansetron (ZOFRAN ODT) 8 MG disintegrating tablet Take 1 tablet (8 mg total) by mouth every 8 (eight) hours as needed for nausea or vomiting. 07/08/14   Marisa Severin, MD   BP 122/65 mmHg  Pulse 80  Temp(Src) 98.4 F (36.9 C) (Oral)  Resp 18  Ht  (1.702 m)  Wt 263 lb (119.296 kg)  BMI 41.18 kg/m2  SpO2 100%  LMP 10/12/2014  Physical Exam General: Well-developed, well-nourished female in no acute distress; appearance consistent with age of record HENT: normocephalic; atraumatic Eyes: pupils equal, round and reactive to light; extraocular muscles intact Neck: supple Heart: regular rate and rhythm Lungs: faint inspiratory wheezes Abdomen: soft; nondistended; no masses or hepatosplenomegaly; bowel sounds present; RUQ tenderness with positive Murphy's sign Extremities: No deformity; full range of motion; pulses normal, +1 pitting edema of BLE Neurologic: Awake, alert and oriented; motor function intact in all extremities and symmetric; no facial droop Skin: Warm and dry Psychiatric: Normal mood and affect  ED Course  Procedures (including critical care  time) DIAGNOSTIC STUDIES: Oxygen Saturation is 100% on RA, normal by my interpretation.    COORDINATION OF CARE: 11:03 PM- Pt advised of plan for treatment which includes medication, radiology, and labs and pt agrees.    MDM   Nursing notes and vitals signs, including pulse oximetry, reviewed.  Summary of this visit's results, reviewed by myself:  Labs:  Results for orders placed or performed during the hospital encounter of 10/18/14 (from the past 24 hour(s))  Urinalysis, Routine w reflex microscopic     Status: Abnormal   Collection Time: 10/18/14  9:11 PM  Result Value Ref Range   Color, Urine YELLOW YELLOW   APPearance CLEAR CLEAR   Specific Gravity, Urine 1.014 1.005 - 1.030   pH 6.5 5.0 - 8.0   Glucose, UA NEGATIVE NEGATIVE mg/dL   Hgb urine dipstick SMALL (A) NEGATIVE   Bilirubin Urine NEGATIVE NEGATIVE   Ketones, ur NEGATIVE NEGATIVE mg/dL   Protein, ur NEGATIVE NEGATIVE mg/dL   Urobilinogen, UA 0.2 0.0 - 1.0 mg/dL   Nitrite NEGATIVE NEGATIVE   Leukocytes, UA NEGATIVE NEGATIVE  Pregnancy, urine     Status: None   Collection Time: 10/18/14  9:11 PM  Result Value Ref Range   Preg Test, Ur NEGATIVE NEGATIVE  Urine microscopic-add on     Status: Abnormal   Collection Time: 10/18/14  9:11 PM  Result Value Ref Range   Squamous Epithelial / LPF FEW (A) RARE   WBC, UA 0-2 <3 WBC/hpf   RBC / HPF 0-2 <3 RBC/hpf   Bacteria, UA RARE RARE  Comprehensive metabolic panel     Status: Abnormal   Collection Time: 10/18/14 11:30 PM  Result Value Ref Range   Sodium 138 135 - 145 mmol/L   Potassium 3.8 3.5 - 5.1 mmol/L   Chloride 103 96 - 112 mmol/L   CO2 29 19 - 32 mmol/L   Glucose, Bld 79 70 - 99 mg/dL   BUN 13 6 - 23 mg/dL   Creatinine, Ser 4.691.08 0.50 - 1.10 mg/dL   Calcium 8.6 8.4 - 62.910.5 mg/dL   Total Protein 6.7 6.0 - 8.3 g/dL   Albumin 3.6 3.5 - 5.2 g/dL   AST 16 0 - 37 U/L   ALT 20 0 - 35 U/L   Alkaline Phosphatase 68 39 - 117 U/L   Total Bilirubin 0.3 0.3 - 1.2  mg/dL   GFR calc non Af Amer 65 (L) >90 mL/min   GFR calc Af Amer 76 (L) >90 mL/min   Anion gap 6 5 - 15  Lipase, blood     Status: None   Collection Time: 10/18/14 11:30 PM  Result Value Ref Range   Lipase 29 11 - 59 U/L  CBC with Differential/Platelet     Status: Abnormal   Collection Time: 10/19/14 12:05 AM  Result Value Ref Range   WBC  16.2 (H) 4.0 - 10.5 K/uL   RBC 3.66 (L) 3.87 - 5.11 MIL/uL   Hemoglobin 11.6 (L) 12.0 - 15.0 g/dL   HCT 16.1 (L) 09.6 - 04.5 %   MCV 93.7 78.0 - 100.0 fL   MCH 31.7 26.0 - 34.0 pg   MCHC 33.8 30.0 - 36.0 g/dL   RDW 40.9 81.1 - 91.4 %   Platelets 424 (H) 150 - 400 K/uL   Neutrophils Relative % 57 43 - 77 %   Lymphocytes Relative 36 12 - 46 %   Monocytes Relative 4 3 - 12 %   Eosinophils Relative 3 0 - 5 %   Basophils Relative 0 0 - 1 %   Neutro Abs 9.3 (H) 1.7 - 7.7 K/uL   Lymphs Abs 5.8 (H) 0.7 - 4.0 K/uL   Monocytes Absolute 0.6 0.1 - 1.0 K/uL   Eosinophils Absolute 0.5 0.0 - 0.7 K/uL   Basophils Absolute 0.0 0.0 - 0.1 K/uL   WBC Morphology WHITE COUNT CONFIRMED ON SMEAR    Smear Review PLATELET COUNT CONFIRMED BY SMEAR     Imaging Studies: US Abdomen Complete  10/19/2014   CLINICAL DATA:  Right upper quadrant abdominal pain, nausea and vomiting for 2 days.  EXAM: ULTRASOUND ABDOMEN COMPLETE  COMPARISON:  Right upper quadrant ultrasound and CT abdomen/ pelvis 07/08/2014  FINDINGS: Gallbladder: No gallstones or wall thickening visualized. No sonographic Murphy sign noted.  Common bile duct: Diameter: 1.5 mm  Liver: No focal lesion identified. Within normal limits in parenchymal echogenicity.  IVC: No abnormality visualized.  Pancreas: Visualized portion unremarkable.  Spleen: Size and appearance within normal limits.  Right Kidney: Length: 9.6 cm. Echogenicity within normal limits. No mass or hydronephrosis visualized. The small renal stone on prior CT is not visualized sonographically.  Left Kidney: Length: 10.8 cm. Echogenicity within normal  limits. No mass or hydronephrosis visualized.  Abdominal aorta: No aneurysm visualized.  Limited visualization.  Other findings: None.  IMPRESSION: 1. Normal abdominal ultrasound. 2. The small right renal stone on prior CT is not visualized sonographically. There is no right hydronephrosis.   Electronically Signed   By: Rubye Oaks M.D.   On: 10/19/2014 00:58   1:16 AM The patient has been sleeping comfortably after a single dose of fentanyl earlier. She was advised of the lab and ultrasound findings. Given her history of known gallbladder malfunction and multiple visits to the ED for abdominal pain she was encouraged to contact [facility] regarding possible cholecystectomy.  I personally performed the services described in this documentation, which was scribed in my presence. The recorded information has been reviewed and is accurate.   [name], MD 10/19/14 [id number]

## 2014-10-18 NOTE — ED Notes (Signed)
Pt reports 2-3 days of ruq abd pain and nausea, worse after eating.  States also with bilateral le swelling, denies cp or sob.

## 2014-10-19 DIAGNOSIS — K828 Other specified diseases of gallbladder: Secondary | ICD-10-CM | POA: Diagnosis not present

## 2014-10-19 LAB — CBC WITH DIFFERENTIAL/PLATELET
BASOS ABS: 0 10*3/uL (ref 0.0–0.1)
Basophils Relative: 0 % (ref 0–1)
EOS ABS: 0.5 10*3/uL (ref 0.0–0.7)
Eosinophils Relative: 3 % (ref 0–5)
HEMATOCRIT: 34.3 % — AB (ref 36.0–46.0)
Hemoglobin: 11.6 g/dL — ABNORMAL LOW (ref 12.0–15.0)
Lymphocytes Relative: 36 % (ref 12–46)
Lymphs Abs: 5.8 10*3/uL — ABNORMAL HIGH (ref 0.7–4.0)
MCH: 31.7 pg (ref 26.0–34.0)
MCHC: 33.8 g/dL (ref 30.0–36.0)
MCV: 93.7 fL (ref 78.0–100.0)
MONOS PCT: 4 % (ref 3–12)
Monocytes Absolute: 0.6 10*3/uL (ref 0.1–1.0)
NEUTROS ABS: 9.3 10*3/uL — AB (ref 1.7–7.7)
Neutrophils Relative %: 57 % (ref 43–77)
Platelets: 424 10*3/uL — ABNORMAL HIGH (ref 150–400)
RBC: 3.66 MIL/uL — AB (ref 3.87–5.11)
RDW: 13.7 % (ref 11.5–15.5)
WBC: 16.2 10*3/uL — AB (ref 4.0–10.5)

## 2014-10-19 LAB — COMPREHENSIVE METABOLIC PANEL
ALBUMIN: 3.6 g/dL (ref 3.5–5.2)
ALK PHOS: 68 U/L (ref 39–117)
ALT: 20 U/L (ref 0–35)
AST: 16 U/L (ref 0–37)
Anion gap: 6 (ref 5–15)
BILIRUBIN TOTAL: 0.3 mg/dL (ref 0.3–1.2)
BUN: 13 mg/dL (ref 6–23)
CO2: 29 mmol/L (ref 19–32)
CREATININE: 1.08 mg/dL (ref 0.50–1.10)
Calcium: 8.6 mg/dL (ref 8.4–10.5)
Chloride: 103 mmol/L (ref 96–112)
GFR calc Af Amer: 76 mL/min — ABNORMAL LOW (ref >90)
GFR, EST NON AFRICAN AMERICAN: 65 mL/min — AB (ref >90)
Glucose, Bld: 79 mg/dL (ref 70–99)
POTASSIUM: 3.8 mmol/L (ref 3.5–5.1)
SODIUM: 138 mmol/L (ref 135–145)
Total Protein: 6.7 g/dL (ref 6.0–8.3)

## 2014-10-19 LAB — LIPASE, BLOOD: LIPASE: 29 U/L (ref 11–59)

## 2014-10-19 MED ORDER — DICYCLOMINE HCL 20 MG PO TABS: 20.0000 mg | ORAL_TABLET | Freq: Four times a day (QID) | ORAL | Status: DC | PRN

## 2014-10-19 MED ORDER — HYDROCODONE-ACETAMINOPHEN 5-325 MG PO TABS: 1.0000 | ORAL_TABLET | Freq: Four times a day (QID) | ORAL | Status: DC | PRN

## 2014-10-19 MED ORDER — ONDANSETRON 8 MG PO TBDP: 8.0000 mg | ORAL_TABLET | Freq: Three times a day (TID) | ORAL | Status: DC | PRN

## 2014-10-19 NOTE — Discharge Instructions (Signed)
Your pain is most likely due to a malfunctioning gallbladder. Since this has been a current problem it is likely he will need to have her gallbladder removed. Please call [facility] tomorrow to set up an appointment.

## 2014-11-01 ENCOUNTER — Encounter (HOSPITAL_BASED_OUTPATIENT_CLINIC_OR_DEPARTMENT_OTHER): Payer: Self-pay

## 2014-11-01 ENCOUNTER — Emergency Department (HOSPITAL_BASED_OUTPATIENT_CLINIC_OR_DEPARTMENT_OTHER)
Admission: EM | Admit: 2014-11-01 | Discharge: 2014-11-02 | Disposition: A | Discharge status: Home / Self Care | Payer: Medicare Other | Attending: Emergency Medicine | Admitting: Emergency Medicine

## 2014-11-01 DIAGNOSIS — J45909 Unspecified asthma, uncomplicated: Secondary | ICD-10-CM | POA: Diagnosis not present

## 2014-11-01 DIAGNOSIS — F319 Bipolar disorder, unspecified: Secondary | ICD-10-CM | POA: Diagnosis not present

## 2014-11-01 DIAGNOSIS — Z3202 Encounter for pregnancy test, result negative: Secondary | ICD-10-CM | POA: Insufficient documentation

## 2014-11-01 DIAGNOSIS — R1011 Right upper quadrant pain: Secondary | ICD-10-CM | POA: Diagnosis present

## 2014-11-01 DIAGNOSIS — Z79899 Other long term (current) drug therapy: Secondary | ICD-10-CM | POA: Insufficient documentation

## 2014-11-01 DIAGNOSIS — Z8669 Personal history of other diseases of the nervous system and sense organs: Secondary | ICD-10-CM | POA: Insufficient documentation

## 2014-11-01 DIAGNOSIS — G629 Polyneuropathy, unspecified: Secondary | ICD-10-CM | POA: Diagnosis not present

## 2014-11-01 DIAGNOSIS — K8020 Calculus of gallbladder without cholecystitis without obstruction: Secondary | ICD-10-CM | POA: Diagnosis not present

## 2014-11-01 DIAGNOSIS — K8050 Calculus of bile duct without cholangitis or cholecystitis without obstruction: Secondary | ICD-10-CM

## 2014-11-01 DIAGNOSIS — Z720 Tobacco use: Secondary | ICD-10-CM | POA: Diagnosis not present

## 2014-11-01 DIAGNOSIS — K219 Gastro-esophageal reflux disease without esophagitis: Secondary | ICD-10-CM | POA: Diagnosis not present

## 2014-11-01 LAB — URINALYSIS, ROUTINE W REFLEX MICROSCOPIC
Bilirubin Urine: NEGATIVE
Glucose, UA: NEGATIVE mg/dL
KETONES UR: NEGATIVE mg/dL
LEUKOCYTES UA: NEGATIVE
Nitrite: NEGATIVE
PH: 6.5 (ref 5.0–8.0)
Protein, ur: NEGATIVE mg/dL
SPECIFIC GRAVITY, URINE: 1.004 — AB (ref 1.005–1.030)
UROBILINOGEN UA: 1 mg/dL (ref 0.0–1.0)

## 2014-11-01 LAB — URINE MICROSCOPIC-ADD ON

## 2014-11-01 LAB — PREGNANCY, URINE: PREG TEST UR: NEGATIVE

## 2014-11-01 MED ORDER — ONDANSETRON HCL 4 MG/2ML IJ SOLN
4.0000 mg | Freq: Once | INTRAMUSCULAR | Status: AC
  Administered 2014-11-02: 4 mg via INTRAVENOUS
  Filled 2014-11-01: qty 2

## 2014-11-01 MED ORDER — SODIUM CHLORIDE 0.9 % IV BOLUS (SEPSIS)
1000.0000 mL | Freq: Once | INTRAVENOUS | Status: AC
  Administered 2014-11-02: 1000 mL via INTRAVENOUS

## 2014-11-01 MED ORDER — FENTANYL CITRATE 0.05 MG/ML IJ SOLN
100.0000 ug | Freq: Once | INTRAMUSCULAR | Status: AC
  Administered 2014-11-02 (×2): 100 ug via INTRAVENOUS
  Filled 2014-11-01: qty 2

## 2014-11-01 NOTE — ED Notes (Signed)
Pt seen here recently for gallstones, reports worsening pain and vomiting.  Zofran not helping per pt.

## 2014-11-01 NOTE — ED Provider Notes (Signed)
CSN: [id number]     Arrival date & time 11/01/14  2227 History  This chart was scribed for Loren Racer, MD by [place] Peace, ED Scribe. The patient was seen in MH06/MH06. The patient's care was started at 11:11 PM.    Chief Complaint  Patient presents with  . Abdominal Pain      Patient is a 39 y.o. female presenting with abdominal pain. The history is provided by the patient. No language interpreter was used.  Abdominal Pain Associated symptoms: nausea and vomiting   Associated symptoms: no chest pain, no chills, no constipation, no diarrhea, no fever and no shortness of breath    HPI Comments: Robin Frey is a 39 y.o. female who presents to the Emergency Department complaining of abdominal pain onset this morning around 4:00 AM with nausea and vomiting (4 episodes). She notes eating any kind of food exacerbates symptoms. No complaints of diarrhea, constipation, fever, or chills. Pt was seen here at ED recently for similar pain. Recent normal CT abdomen and abdominal ultrasound. Patient states she's had a HIDA scan performed that showed gallbladder dysfunction. She has an appointment to [facility] on Monday. No fever or chills. Normal bowel movements. No urinary complaints.   Past Medical History  Diagnosis Date  . Neuropathy   . Bipolar disorder   . GERD (gastroesophageal reflux disease)   . Depression   . Asthma   . Sleep apnea    Past Surgical History  Procedure Laterality Date  . Cardiac surgery    . Tubal ligation    . Appendectomy     Family History  Problem Relation Age of Onset  . Diabetes Other   . Hyperlipidemia Other   . Hypertension Other   . Stroke Other   . Asthma Other   . Cancer Other    History  Substance Use Topics  . Smoking status: Light Tobacco Smoker -- 0.25 packs/day    Types: Cigarettes  . Smokeless tobacco: Not on file  . Alcohol Use: No   OB History    No data available     Review of Systems  Constitutional:  Negative for fever and chills.  Respiratory: Negative for shortness of breath.   Cardiovascular: Negative for chest pain.  Gastrointestinal: Positive for nausea, vomiting and abdominal pain. Negative for diarrhea and constipation.  Musculoskeletal: Negative for myalgias, back pain, neck pain and neck stiffness.  Skin: Negative for rash and wound.  Neurological: Negative for dizziness, weakness, light-headedness, numbness and headaches.  All other systems reviewed and are negative.     Allergies  Cephalexin; Ciprofloxacin; Morphine and related; Trazodone and nefazodone; and Ketorolac tromethamine  Home Medications   Prior to Admission medications   Medication Sig Start Date End Date Taking? Authorizing Provider  albuterol (PROAIR HFA) 108 (90 BASE) MCG/ACT inhaler Inhale 2 puffs into the lungs every 6 (six) hours as needed for wheezing or shortness of breath.    Historical Provider, MD  dicyclomine (BENTYL) 20 MG tablet Take 1 tablet (20 mg total) by mouth every 6 (six) hours as needed (for abdominal cramping). 10/19/14   John Molpus, MD  FLUoxetine (PROZAC) 20 MG tablet Take 20 mg by mouth daily.    Historical Provider, MD  gabapentin (NEURONTIN) 300 MG capsule Take 300 mg by mouth at bedtime.    Historical Provider, MD  HYDROcodone-acetaminophen (NORCO) 5-325 MG per tablet Take 1-2 tablets by mouth every 4 (four) hours as needed for moderate pain or severe pain (for pain).  11/02/14   Loren Raceravid Joann Kulpa, MD  omeprazole (PRILOSEC) 40 MG capsule Take 40 mg by mouth daily.    Historical Provider, MD  ondansetron (ZOFRAN ODT) 8 MG disintegrating tablet Take 1 tablet (8 mg total) by mouth every 8 (eight) hours as needed for nausea or vomiting. 11/02/14   Loren Raceravid Gabrielle Mester, MD   BP 118/69 mmHg  Pulse 76  Temp(Src) 98.7 F (37.1 C) (Oral)  Resp 18  Ht 5\' 7"  (1.702 m)  Wt 263 lb (119.296 kg)  BMI 41.18 kg/m2  SpO2 100%  LMP 10/12/2014 Physical Exam  Constitutional: She is oriented to person,  place, and time. She appears well-developed and well-nourished. No distress.  HENT:  Head: Normocephalic and atraumatic.  Mouth/Throat: Oropharynx is clear and moist.  Eyes: EOM are normal. Pupils are equal, round, and reactive to light.  Neck: Normal range of motion. Neck supple.  Cardiovascular: Normal rate and regular rhythm.   Pulmonary/Chest: Effort normal and breath sounds normal. No respiratory distress. She has no wheezes. She has no rales.  Abdominal: Soft. Bowel sounds are normal. She exhibits no distension and no mass. There is tenderness (very mild tendernessin the right upper quadrant). There is no rebound and no guarding.  Musculoskeletal: Normal range of motion. She exhibits no edema or tenderness.  No CVA tenderness bilaterally.  Neurological: She is alert and oriented to person, place, and time.  Skin: Skin is warm and dry. No rash noted. No erythema.  Psychiatric: She has a normal mood and affect. Her behavior is normal.  Nursing note and vitals reviewed.   ED Course  Procedures (including critical care time) Labs Review Labs Reviewed  CBC WITH DIFFERENTIAL/PLATELET - Abnormal; Notable for the following:    WBC 15.8 (*)    Neutro Abs 9.1 (*)    Lymphs Abs 5.0 (*)    Monocytes Absolute 1.1 (*)    All other components within normal limits  COMPREHENSIVE METABOLIC PANEL - Abnormal; Notable for the following:    Total Bilirubin 0.1 (*)    GFR calc non Af Amer 71 (*)    GFR calc Af Amer 82 (*)    All other components within normal limits  URINALYSIS, ROUTINE W REFLEX MICROSCOPIC - Abnormal; Notable for the following:    Specific Gravity, Urine 1.004 (*)    Hgb urine dipstick SMALL (*)    All other components within normal limits  LIPASE, BLOOD  PREGNANCY, URINE  URINE MICROSCOPIC-ADD ON    Imaging Review No results found.   EKG Interpretation None     Medications  fentaNYL (SUBLIMAZE) injection 100 mcg (100 mcg Intravenous Given 11/02/14 0152)   ondansetron (ZOFRAN) injection 4 mg (4 mg Intravenous Given 11/02/14 0038)  sodium chloride 0.9 % bolus 1,000 mL (0 mLs Intravenous Stopped 11/02/14 0149)    11:15 PM- Treatment plan was discussed with patient who verbalizes understanding and agrees.   MDM   Final diagnoses:  Recurrent biliary colic   I personally performed the services described in this documentation, which was scribed in my presence. The recorded information has been reviewed and is accurate.   Pain is resolved. Vital signs remained stable. Patient has stable leukocytosis. Normal liver function tests and lipase. Bedside ultrasound with no visible stones and gallbladder. Gallbladder wall measures 0.25 cm. There is no pericholecystic fluid.  Do not believe further imaging is necessary at this point. Patient has appointment to see surgeon on Monday. We'll treat with pain control and given return precautions.  Loren Raceravid Keevan Wolz, MD  11/02/14 0230 

## 2014-11-02 DIAGNOSIS — K8020 Calculus of gallbladder without cholecystitis without obstruction: Secondary | ICD-10-CM | POA: Diagnosis not present

## 2014-11-02 LAB — CBC WITH DIFFERENTIAL/PLATELET
BASOS PCT: 0 % (ref 0–1)
Basophils Absolute: 0.1 10*3/uL (ref 0.0–0.1)
EOS ABS: 0.5 10*3/uL (ref 0.0–0.7)
Eosinophils Relative: 3 % (ref 0–5)
HCT: 39.6 % (ref 36.0–46.0)
HEMOGLOBIN: 13.2 g/dL (ref 12.0–15.0)
Lymphocytes Relative: 32 % (ref 12–46)
Lymphs Abs: 5 10*3/uL — ABNORMAL HIGH (ref 0.7–4.0)
MCH: 31.1 pg (ref 26.0–34.0)
MCHC: 33.3 g/dL (ref 30.0–36.0)
MCV: 93.4 fL (ref 78.0–100.0)
MONO ABS: 1.1 10*3/uL — AB (ref 0.1–1.0)
MONOS PCT: 7 % (ref 3–12)
Neutro Abs: 9.1 10*3/uL — ABNORMAL HIGH (ref 1.7–7.7)
Neutrophils Relative %: 58 % (ref 43–77)
Platelets: 347 10*3/uL (ref 150–400)
RBC: 4.24 MIL/uL (ref 3.87–5.11)
RDW: 13.6 % (ref 11.5–15.5)
WBC: 15.8 10*3/uL — ABNORMAL HIGH (ref 4.0–10.5)

## 2014-11-02 LAB — COMPREHENSIVE METABOLIC PANEL
ALK PHOS: 77 U/L (ref 39–117)
ALT: 21 U/L (ref 0–35)
ANION GAP: 7 (ref 5–15)
AST: 16 U/L (ref 0–37)
Albumin: 4 g/dL (ref 3.5–5.2)
BILIRUBIN TOTAL: 0.1 mg/dL — AB (ref 0.3–1.2)
BUN: 13 mg/dL (ref 6–23)
CO2: 27 mmol/L (ref 19–32)
Calcium: 9.1 mg/dL (ref 8.4–10.5)
Chloride: 103 mmol/L (ref 96–112)
Creatinine, Ser: 1.01 mg/dL (ref 0.50–1.10)
GFR calc Af Amer: 82 mL/min — ABNORMAL LOW (ref >90)
GFR calc non Af Amer: 71 mL/min — ABNORMAL LOW (ref >90)
Glucose, Bld: 89 mg/dL (ref 70–99)
Potassium: 3.7 mmol/L (ref 3.5–5.1)
Sodium: 137 mmol/L (ref 135–145)
Total Protein: 7.7 g/dL (ref 6.0–8.3)

## 2014-11-02 LAB — LIPASE, BLOOD: Lipase: 26 U/L (ref 11–59)

## 2014-11-02 MED ORDER — ONDANSETRON 8 MG PO TBDP: 8.0000 mg | ORAL_TABLET | Freq: Three times a day (TID) | ORAL | Status: DC | PRN

## 2014-11-02 MED ORDER — FENTANYL CITRATE 0.05 MG/ML IJ SOLN
INTRAMUSCULAR | Status: AC
  Administered 2014-11-02: 100 ug via INTRAVENOUS
  Filled 2014-11-02: qty 2

## 2014-11-02 MED ORDER — HYDROCODONE-ACETAMINOPHEN 5-325 MG PO TABS: 1.0000 | ORAL_TABLET | ORAL | Status: DC | PRN

## 2014-11-02 NOTE — ED Notes (Signed)
Ultrasound machine at bedside , 100 mcg iv Fentanyl given for pain RUQ abd.

## 2014-11-02 NOTE — Discharge Instructions (Signed)

## 2014-11-18 NOTE — Progress Notes (Signed)
Please put orders in Epic surgery 11-26-14 pre op 11-21-14 Thanks

## 2014-11-20 ENCOUNTER — Other Ambulatory Visit (HOSPITAL_COMMUNITY): Payer: Self-pay | Admitting: *Deleted

## 2014-11-20 NOTE — Patient Instructions (Addendum)
Robin LitesMargie A Frey  11/20/2014   Your procedure is scheduled on: Wednesday 11/26/2014  [facility]  Entr[facility] and follow signs to               Short Stay Center at 0630 AM.  Call this number if you have problems the morning of surgery [phone]   Remember:  Do not eat food or drink liquids :After Midnight.     Take these medicines the morning of surgery with A SIP OF WATER: FLUOXETINE (PROZAC), PRILOSEC                               You may not have any metal on your body including hair pins and              piercings  Do not wear jewelry, make-up, lotions, powders or perfumes.             Do not wear nail polish.  Do not shave  48 hours prior to surgery.              Men may shave face and neck.   Do not bring valuables to the hospital. [name] IS NOT             RESPONSIBLE   FOR VALUABLES.  Contacts, dentures or bridgework may not be worn into surgery.  Leave suitcase in the car. After surgery it may be brought to your room.     Patients discharged the day of surgery will not be allowed to drive home.  Name and phone number of your driver: Daughter or Aunt  Special Instructions: N/A              Please read over the following fact sheets you were given: _____________________________________________________________________             [name] - Preparing for Surgery Before surgery, you can play an important role.  Because skin is not sterile, your skin needs to be as free of germs as possible.  You can reduce the number of germs on your skin by washing with CHG (chlorahexidine gluconate) soap before surgery.  CHG is an antiseptic cleaner which kills germs and bonds with the skin to continue killing germs even after washing. Please DO NOT use if you have an allergy to CHG or antibacterial soaps.  If your skin becomes reddened/irritated stop using the CHG and inform your nurse when you arrive at Short Stay. Do not shave (including  legs and underarms) for at least 48 hours prior to the first CHG shower.  You may shave your face/neck. Please follow these instructions carefully:  1.  Shower with CHG Soap the night before surgery and the  morning of Surgery.  2.  If you choose to wash your hair, wash your hair first as usual with your  normal  shampoo.  3.  After you shampoo, rinse your hair and body thoroughly to remove the  shampoo.                           4.  Use CHG as you would any other liquid soap.  You can apply chg directly  to the skin and wash  Gently with a scrungie or clean washcloth.  5.  Apply the CHG Soap to your body ONLY FROM THE NECK DOWN.   Do not use on face/ open                           Wound or open sores. Avoid contact with eyes, ears mouth and genitals (private parts).                       Wash face,  Genitals (private parts) with your normal soap.             6.  Wash thoroughly, paying special attention to the area where your surgery  will be performed.  7.  Thoroughly rinse your body with warm water from the neck down.  8.  DO NOT shower/wash with your normal soap after using and rinsing off  the CHG Soap.                9.  Pat yourself dry with a clean towel.            10.  Wear clean pajamas.            11.  Place clean sheets on your bed the night of your first shower and do not  sleep with pets. Day of Surgery : Do not apply any lotions/deodorants the morning of surgery.  Please wear clean clothes to the hospital/surgery center.  FAILURE TO FOLLOW THESE INSTRUCTIONS MAY RESULT IN THE CANCELLATION OF YOUR SURGERY PATIENT SIGNATURE_________________________________  NURSE SIGNATURE__________________________________  ________________________________________________________________________   Adam Phenix  An incentive spirometer is a tool that can help keep your lungs clear and active. This tool measures how well you are filling your lungs with each breath.  Taking long deep breaths may help reverse or decrease the chance of developing breathing (pulmonary) problems (especially infection) following:  A long period of time when you are unable to move or be active. BEFORE THE PROCEDURE   If the spirometer includes an indicator to show your best effort, your nurse or respiratory therapist will set it to a desired goal.  If possible, sit up straight or lean slightly forward. Try not to slouch.  Hold the incentive spirometer in an upright position. INSTRUCTIONS FOR USE  1. Sit on the edge of your bed if possible, or sit up as far as you can in bed or on a chair. 2. Hold the incentive spirometer in an upright position. 3. Breathe out normally. 4. Place the mouthpiece in your mouth and seal your lips tightly around it. 5. Breathe in slowly and as deeply as possible, raising the piston or the ball toward the top of the column. 6. Hold your breath for 3-5 seconds or for as long as possible. Allow the piston or ball to fall to the bottom of the column. 7. Remove the mouthpiece from your mouth and breathe out normally. 8. Rest for a few seconds and repeat Steps 1 through 7 at least 10 times every 1-2 hours when you are awake. Take your time and take a few normal breaths between deep breaths. 9. The spirometer may include an indicator to show your best effort. Use the indicator as a goal to work toward during each repetition. 10. After each set of 10 deep breaths, practice coughing to be sure your lungs are clear. If you have an incision (the cut made at the time of surgery),  support your incision when coughing by placing a pillow or rolled up towels firmly against it. Once you are able to get out of bed, walk around indoors and cough well. You may stop using the incentive spirometer when instructed by your caregiver.  RISKS AND COMPLICATIONS  Take your time so you do not get dizzy or light-headed.  If you are in pain, you may need to take or ask for pain  medication before doing incentive spirometry. It is harder to take a deep breath if you are having pain. AFTER USE  Rest and breathe slowly and easily.  It can be helpful to keep track of a log of your progress. Your caregiver can provide you with a simple table to help with this. If you are using the spirometer at home, follow these instructions: [place] IF:   You are having difficultly using the spirometer.  You have trouble using the spirometer as often as instructed.  Your pain medication is not giving enough relief while using the spirometer.  You develop fever of 100.5 F (38.1 C) or higher. SEEK IMMEDIATE MEDICAL CARE IF:   You cough up bloody sputum that had not been present before.  You develop fever of 102 F (38.9 C) or greater.  You develop worsening pain at or near the incision site. MAKE SURE YOU:   Understand these instructions.  Will watch your condition.  Will get help right away if you are not doing well or get worse. Document Released: 12/05/2006 Document Revised: 10/17/2011 Document Reviewed: 02/05/2007 [facility] Patient Information 2014 [place], [place].   ________________________________________________________________________

## 2014-11-20 NOTE — Progress Notes (Signed)
02/22/2012-2 D ECHO [facility] ON CHART. 07/08/2014-IN EPIC, CT ABD,/PELVIS W/CONTRAST.

## 2014-11-21 ENCOUNTER — Encounter (HOSPITAL_COMMUNITY)
Admission: RE | Admit: 2014-11-21 | Discharge: 2014-11-21 | Disposition: A | Discharge status: Home / Self Care | Payer: Medicare Other | Source: Ambulatory Visit | Attending: General Surgery | Admitting: General Surgery

## 2014-11-21 ENCOUNTER — Encounter (HOSPITAL_COMMUNITY): Payer: Self-pay

## 2014-11-21 DIAGNOSIS — R1084 Generalized abdominal pain: Secondary | ICD-10-CM | POA: Insufficient documentation

## 2014-11-21 DIAGNOSIS — Z01812 Encounter for preprocedural laboratory examination: Secondary | ICD-10-CM | POA: Diagnosis not present

## 2014-11-21 HISTORY — DX: Pneumonia, unspecified organism: J18.9

## 2014-11-21 HISTORY — DX: Cardiac arrhythmia, unspecified: I49.9

## 2014-11-21 LAB — CBC
HCT: 40 % (ref 36.0–46.0)
HEMOGLOBIN: 13.2 g/dL (ref 12.0–15.0)
MCH: 31.3 pg (ref 26.0–34.0)
MCHC: 33 g/dL (ref 30.0–36.0)
MCV: 94.8 fL (ref 78.0–100.0)
Platelets: 262 10*3/uL (ref 150–400)
RBC: 4.22 MIL/uL (ref 3.87–5.11)
RDW: 14.2 % (ref 11.5–15.5)
WBC: 9.8 10*3/uL (ref 4.0–10.5)

## 2014-11-21 LAB — HCG, SERUM, QUALITATIVE: Preg, Serum: NEGATIVE

## 2014-11-21 LAB — BASIC METABOLIC PANEL
ANION GAP: 6 (ref 5–15)
BUN: 11 mg/dL (ref 6–23)
CO2: 24 mmol/L (ref 19–32)
CREATININE: 0.99 mg/dL (ref 0.50–1.10)
Calcium: 9.2 mg/dL (ref 8.4–10.5)
Chloride: 109 mmol/L (ref 96–112)
GFR calc non Af Amer: 72 mL/min — ABNORMAL LOW (ref >90)
GFR, EST AFRICAN AMERICAN: 84 mL/min — AB (ref >90)
Glucose, Bld: 128 mg/dL — ABNORMAL HIGH (ref 70–99)
POTASSIUM: 4.5 mmol/L (ref 3.5–5.1)
SODIUM: 139 mmol/L (ref 135–145)

## 2014-11-24 ENCOUNTER — Ambulatory Visit: Payer: Self-pay | Admitting: General Surgery

## 2014-11-24 NOTE — H&P (Signed)
Nevaen A. Haydon 11/03/2014 2:16 PM Location: [facility] Patient #: [id number] DOB: 1976/08/03 Divorced / Language: [place] / Race: White Female  History of Present Illness Minerva Areola M. Gaylen Pereira MD; 11/03/2014 2:48 PM) Patient words: GB.  The patient is a 39 year old female who presents with non-malignant abdominal pain. She is referred by Dr Marita Kansas for evaluation of RUQ pain. The patient states that she's been having intermittent abdominal pain for 3 years. It is primarily in her right upper abdomen and radiates to her side. It generally occurs after eating. It lasts for several hours. However over the past few months the frequency and intensity of her pain has been worsening. She now has episodes at least 4 times a week. It is now associated with nausea and vomiting. It last for several hours. She also complains of feeling bloated. She denies any diarrhea or constipation. She states that she's lost about 10 pounds. She denies any acholic stools. She denies any jaundice. She does take Tylenol and Motrin and Goody's powders. She denies any heartburn or indigestion. She denies any prior abdominal surgery except for tubal ligation and appendectomy. She has been T [facility], [address] [facility], and our ER multiple times over the past year. She has undergone numerous CT scans as well as abdominal ultrasounds. There is no evidence of gallstones. She states that she had a nuclear medicine scan down at [facility] which showed a lazy gallbladder. She reports open heart surgery as a child. She does smoke half a pack per day.   Other Problems (Ammie Eversole, LPN; 0/45/4098 1:19 PM) Anxiety Disorder Asthma Depression Gastroesophageal Reflux Disease Home Oxygen Use Migraine Headache Other disease, cancer, significant illness Sleep Apnea  Past Surgical History (Ammie Eversole, LPN; 1/47/8295 6:21 PM) Resection of Stomach  Diagnostic Studies History (Ammie  Eversole, LPN; 10/13/6576 4:69 PM) Colonoscopy 1-5 years ago Mammogram never Pap Smear 1-5 years ago  Allergies (Ammie Eversole, LPN; 02/04/5283 1:32 PM) Cephalexin *CEPHALOSPORINS* Ciprofloxacin *FLUOROQUINOLONES* Morphine Sulfate (PF) *ANALGESICS - OPIOID* TraZODone HCl *ANTIDEPRESSANTS* Ketorolac Tromethamine *DERMATOLOGICALS*  Medication History (Ammie Eversole, LPN; 4/40/1027 2:53 PM) ProAir HFA (108 (90 Base)MCG/ACT Aerosol Soln, Inhalation) Active. PROzac (  Capsule, Oral) Active. Neurontin (  Capsule, Oral) Active. PriLOSEC (  Capsule DR, Oral) Active. Zofran (  Tablet, Oral) Active.  Social History (Ammie Eversole, LPN; 6/64/4034 7:42 PM) Caffeine use Coffee. No alcohol use No drug use Tobacco use Current every day smoker.  Family History Deon Pilling, LPN; 5/95/6387 5:64 PM) Alcohol Abuse Brother. Arthritis Mother. Cerebrovascular Accident Father. Cervical Cancer Sister. Depression Brother, Daughter, Family Members In [place], Mother, Sister. Diabetes Mellitus Mother, Sister. Heart disease in female family member before age 8 Hypertension Father, Mother. Migraine Headache Mother, Sister. Respiratory Condition Mother. Seizure disorder Family Members In General.  Pregnancy / Birth History Deon Pilling, LPN; 3/32/9518 8:41 PM) Age at menarche 15 years. Gravida 4 Maternal age <15 Para 4 Regular periods  Review of Systems (Ammie Eversole LPN; 6/60/6301 6:01 PM) General Present- Appetite Loss and Fatigue. Not Present- Chills, Fever, Night Sweats, Weight Gain and Weight Loss. Skin Present- Dryness. Not Present- Change in Wart/Mole, Hives, Jaundice, New Lesions, Non-Healing Wounds, Rash and Ulcer. HEENT Not Present- Earache, Hearing Loss, Hoarseness, Nose Bleed, Oral Ulcers, Ringing in the Ears, Seasonal Allergies, Sinus Pain, Sore Throat, Visual Disturbances, Wears glasses/contact lenses and Yellow Eyes. Respiratory  Not Present- Bloody sputum, Chronic Cough, Difficulty Breathing, Snoring and Wheezing. Breast Not Present- Breast Mass, Breast Pain, Nipple Discharge and Skin Changes. Cardiovascular Present- Leg Cramps, Rapid  Heart Rate and Swelling of Extremities. Not Present- Chest Pain, Difficulty Breathing Lying Down, Palpitations and Shortness of Breath. Gastrointestinal Present- Abdominal Pain, Bloating, Gets full quickly at meals, Indigestion, Nausea and Vomiting. Not Present- Bloody Stool, Change in Bowel Habits, Chronic diarrhea, Constipation, Difficulty Swallowing, Excessive gas, Hemorrhoids and Rectal Pain. Female Genitourinary Not Present- Frequency, Nocturia, Painful Urination, Pelvic Pain and Urgency. Musculoskeletal Present- Swelling of Extremities. Not Present- Back Pain, Joint Pain, Joint Stiffness, Muscle Pain and Muscle Weakness. Neurological Present- Headaches and Tingling. Not Present- Decreased Memory, Fainting, Numbness, Seizures, Tremor, Trouble walking and Weakness. Psychiatric Present- Anxiety, Bipolar, Change in Sleep Pattern, Depression and Fearful. Not Present- Frequent crying. Endocrine Present- Hot flashes. Not Present- Cold Intolerance, Excessive Hunger, Hair Changes, Heat Intolerance and New Diabetes. Hematology Not Present- Easy Bruising, Excessive bleeding, Gland problems, HIV and Persistent Infections.   Vitals (Ammie Eversole LPN; 1/61/09603/28/2016 4:542:17 PM) 11/03/2014 2:17 PM Weight: 267.6 lb Height: 67in Body Surface Area: 2.4 m Body Mass Index: 41.91 kg/m Temp.: 55F(Oral)  Pulse: 70 (Regular)  BP: 120/80 (Sitting, Left Arm, Standard)    Physical Exam Minerva Areola(Somara Frymire M. Cloys Vera MD; 11/03/2014 2:43 PM) General Mental Status-Alert. General Appearance-Consistent with stated age. Hydration-Well hydrated. Voice-Normal. Note: morbidly obese   Head and Neck Head-normocephalic, atraumatic with no lesions or palpable masses. Trachea-midline. Thyroid Gland  Characteristics - normal size and consistency.  Eye Eyeball - Bilateral-Extraocular movements intact. Sclera/Conjunctiva - Bilateral-No scleral icterus.  Chest and Lung Exam Chest and lung exam reveals -quiet, even and easy respiratory effort with no use of accessory muscles and on auscultation, normal breath sounds, no adventitious sounds and normal vocal resonance. Inspection Chest Wall - Normal. Back - normal.  Breast - Did not examine.  Cardiovascular Cardiovascular examination reveals -normal heart sounds, regular rate and rhythm with no murmurs and normal pedal pulses bilaterally.  Abdomen Inspection Inspection of the abdomen reveals - No Hernias. Skin - Scar - no surgical scars. Palpation/Percussion Palpation and Percussion of the abdomen reveal - Soft, Non Tender, No Rebound tenderness, No Rigidity (guarding) and No hepatosplenomegaly. Auscultation Auscultation of the abdomen reveals - Bowel sounds normal.  Peripheral Vascular Upper Extremity Palpation - Pulses bilaterally normal.  Neurologic Neurologic evaluation reveals -alert and oriented x 3 with no impairment of recent or remote memory. Mental Status-Normal.  Neuropsychiatric The patient's mood and affect are described as -normal. Judgment and Insight-insight is appropriate concerning matters relevant to self.  Musculoskeletal Normal Exam - Left-Upper Extremity Strength Normal and Lower Extremity Strength Normal. Normal Exam - Right-Upper Extremity Strength Normal and Lower Extremity Strength Normal.  Lymphatic Head & Neck  General Head & Neck Lymphatics: Bilateral - Description - Normal. Axillary - Did not examine. Femoral & Inguinal - Did not examine.    Assessment & Plan Minerva Areola(Shontavia Mickel M. Jailan Trimm MD; 11/03/2014 2:47 PM) BILIARY COLIC (574.20  K80.50) Impression: [facility] as well as our emergency room. 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 I do not have access to her nuclear medicine imaging. However her symptoms are pretty classic for biliary colic. Therefore I recommended proceeding with cholecystectomy. Current Plans  Schedule for Surgery I believe the patient's symptoms are consistent with gallbladder disease.  We discussed gallbladder disease. The patient was given Agricultural engineereducational material. We discussed non-operative and operative management. We discussed the signs & symptoms of acute cholecystitis  I discussed laparoscopic cholecystectomy with IOC in detail.  The patient was given educational material as well as diagrams detailing the procedure. We discussed the risks and benefits of a laparoscopic cholecystectomy including, but not limited to bleeding, infection, injury to surrounding structures such as the intestine or liver, bile leak, retained gallstones, need to convert to an open procedure, prolonged diarrhea, blood clots such as DVT, common bile duct injury, anesthesia risks, and possible need for additional procedures. We discussed the typical post-operative recovery course. I explained that the likelihood of improvement of their symptoms is good.  The patient has elected to proceed with surgery. SLEEP APNEA IN ADULT (327.23  G47.33) Home Oxygen Use DEPRESSION, CONTROLLED (311  F32.9) ANXIETY (300.00  F41.9)  Mary Sella. Andrey Campanile, MD, FACS General, Bariatric, & Minimally Invasive Surgery [facility], [place]

## 2014-11-26 ENCOUNTER — Encounter (HOSPITAL_COMMUNITY)
Admission: RE | Disposition: A | Discharge status: Home / Self Care | Payer: Self-pay | Source: Ambulatory Visit | Attending: General Surgery

## 2014-11-26 ENCOUNTER — Ambulatory Visit (HOSPITAL_COMMUNITY): Payer: Medicare Other | Admitting: Certified Registered Nurse Anesthetist

## 2014-11-26 ENCOUNTER — Ambulatory Visit (HOSPITAL_COMMUNITY): Payer: Medicare Other

## 2014-11-26 ENCOUNTER — Ambulatory Visit (HOSPITAL_COMMUNITY)
Admission: RE | Admit: 2014-11-26 | Discharge: 2014-11-26 | Disposition: A | Discharge status: Home / Self Care | Payer: Medicare Other | Source: Ambulatory Visit | Attending: General Surgery | Admitting: General Surgery

## 2014-11-26 ENCOUNTER — Encounter (HOSPITAL_COMMUNITY): Payer: Self-pay | Admitting: *Deleted

## 2014-11-26 DIAGNOSIS — Z6841 Body Mass Index (BMI) 40.0 and over, adult: Secondary | ICD-10-CM | POA: Insufficient documentation

## 2014-11-26 DIAGNOSIS — Z419 Encounter for procedure for purposes other than remedying health state, unspecified: Secondary | ICD-10-CM

## 2014-11-26 DIAGNOSIS — K219 Gastro-esophageal reflux disease without esophagitis: Secondary | ICD-10-CM | POA: Diagnosis not present

## 2014-11-26 DIAGNOSIS — F319 Bipolar disorder, unspecified: Secondary | ICD-10-CM | POA: Diagnosis not present

## 2014-11-26 DIAGNOSIS — K8020 Calculus of gallbladder without cholecystitis without obstruction: Secondary | ICD-10-CM | POA: Diagnosis present

## 2014-11-26 DIAGNOSIS — F17210 Nicotine dependence, cigarettes, uncomplicated: Secondary | ICD-10-CM | POA: Insufficient documentation

## 2014-11-26 DIAGNOSIS — G4730 Sleep apnea, unspecified: Secondary | ICD-10-CM | POA: Diagnosis not present

## 2014-11-26 DIAGNOSIS — Z888 Allergy status to other drugs, medicaments and biological substances status: Secondary | ICD-10-CM | POA: Insufficient documentation

## 2014-11-26 DIAGNOSIS — Z9851 Tubal ligation status: Secondary | ICD-10-CM | POA: Diagnosis not present

## 2014-11-26 DIAGNOSIS — Z881 Allergy status to other antibiotic agents status: Secondary | ICD-10-CM | POA: Diagnosis not present

## 2014-11-26 DIAGNOSIS — Z885 Allergy status to narcotic agent status: Secondary | ICD-10-CM | POA: Insufficient documentation

## 2014-11-26 DIAGNOSIS — J45909 Unspecified asthma, uncomplicated: Secondary | ICD-10-CM | POA: Insufficient documentation

## 2014-11-26 DIAGNOSIS — G43909 Migraine, unspecified, not intractable, without status migrainosus: Secondary | ICD-10-CM | POA: Diagnosis not present

## 2014-11-26 DIAGNOSIS — F419 Anxiety disorder, unspecified: Secondary | ICD-10-CM | POA: Insufficient documentation

## 2014-11-26 HISTORY — PX: CHOLECYSTECTOMY: SHX55

## 2014-11-26 SURGERY — LAPAROSCOPIC CHOLECYSTECTOMY WITH INTRAOPERATIVE CHOLANGIOGRAM
Anesthesia: General | Site: Abdomen

## 2014-11-26 MED ORDER — HYDROMORPHONE HCL 1 MG/ML IJ SOLN
INTRAMUSCULAR | Status: AC
  Filled 2014-11-26: qty 1

## 2014-11-26 MED ORDER — PROPOFOL 10 MG/ML IV BOLUS
INTRAVENOUS | Status: AC
  Filled 2014-11-26: qty 20

## 2014-11-26 MED ORDER — LACTATED RINGERS IV SOLN
INTRAVENOUS | Status: DC | PRN
  Administered 2014-11-26: 07:00:00 via INTRAVENOUS

## 2014-11-26 MED ORDER — BUPIVACAINE-EPINEPHRINE 0.25% -1:200000 IJ SOLN
INTRAMUSCULAR | Status: DC | PRN
  Administered 2014-11-26: 30 mL

## 2014-11-26 MED ORDER — NEOSTIGMINE METHYLSULFATE 10 MG/10ML IV SOLN
INTRAVENOUS | Status: DC | PRN
  Administered 2014-11-26: 5 mg via INTRAVENOUS

## 2014-11-26 MED ORDER — FENTANYL CITRATE (PF) 100 MCG/2ML IJ SOLN
INTRAMUSCULAR | Status: DC | PRN
  Administered 2014-11-26 (×7): 50 ug via INTRAVENOUS

## 2014-11-26 MED ORDER — ONDANSETRON HCL 4 MG/2ML IJ SOLN
INTRAMUSCULAR | Status: DC | PRN
Start: 2014-11-26 — End: 2014-11-26
  Administered 2014-11-26: 4 mg via INTRAVENOUS

## 2014-11-26 MED ORDER — ROCURONIUM BROMIDE 100 MG/10ML IV SOLN
INTRAVENOUS | Status: AC
  Filled 2014-11-26: qty 1

## 2014-11-26 MED ORDER — MIDAZOLAM HCL 2 MG/2ML IJ SOLN
INTRAMUSCULAR | Status: AC
  Filled 2014-11-26: qty 2

## 2014-11-26 MED ORDER — PROPOFOL 10 MG/ML IV BOLUS
INTRAVENOUS | Status: DC | PRN
  Administered 2014-11-26: 200 mg via INTRAVENOUS

## 2014-11-26 MED ORDER — FENTANYL CITRATE (PF) 250 MCG/5ML IJ SOLN
INTRAMUSCULAR | Status: AC
  Filled 2014-11-26: qty 5

## 2014-11-26 MED ORDER — FENTANYL CITRATE (PF) 100 MCG/2ML IJ SOLN
INTRAMUSCULAR | Status: AC
  Filled 2014-11-26: qty 2

## 2014-11-26 MED ORDER — LACTATED RINGERS IV SOLN
INTRAVENOUS | Status: DC
  Administered 2014-11-26: 10:00:00 via INTRAVENOUS

## 2014-11-26 MED ORDER — 0.9 % SODIUM CHLORIDE (POUR BTL) OPTIME
TOPICAL | Status: DC | PRN
  Administered 2014-11-26: 1000 mL

## 2014-11-26 MED ORDER — LIDOCAINE HCL (CARDIAC) 20 MG/ML IV SOLN
INTRAVENOUS | Status: DC | PRN
  Administered 2014-11-26: 100 mg via INTRAVENOUS

## 2014-11-26 MED ORDER — ALBUTEROL SULFATE HFA 108 (90 BASE) MCG/ACT IN AERS
INHALATION_SPRAY | RESPIRATORY_TRACT | Status: DC | PRN
  Administered 2014-11-26: 4 via RESPIRATORY_TRACT

## 2014-11-26 MED ORDER — METRONIDAZOLE IN NACL 5-0.79 MG/ML-% IV SOLN
500.0000 mg | INTRAVENOUS | Status: AC
  Administered 2014-11-26: 500 mg via INTRAVENOUS
  Filled 2014-11-26: qty 100

## 2014-11-26 MED ORDER — VANCOMYCIN HCL 10 G IV SOLR
1500.0000 mg | INTRAVENOUS | Status: AC
  Administered 2014-11-26: 1500 mg via INTRAVENOUS
  Filled 2014-11-26: qty 1500

## 2014-11-26 MED ORDER — SUCCINYLCHOLINE CHLORIDE 20 MG/ML IJ SOLN
INTRAMUSCULAR | Status: DC | PRN
  Administered 2014-11-26: 140 mg via INTRAVENOUS

## 2014-11-26 MED ORDER — IOHEXOL 300 MG/ML  SOLN
INTRAMUSCULAR | Status: DC | PRN
  Administered 2014-11-26: 2 mL

## 2014-11-26 MED ORDER — CHLORHEXIDINE GLUCONATE 4 % EX LIQD: 1.0000 "application " | Freq: Once | CUTANEOUS | Status: DC

## 2014-11-26 MED ORDER — LIDOCAINE HCL (CARDIAC) 20 MG/ML IV SOLN
INTRAVENOUS | Status: AC
  Filled 2014-11-26: qty 5

## 2014-11-26 MED ORDER — ALBUTEROL SULFATE (2.5 MG/3ML) 0.083% IN NEBU
2.5000 mg | INHALATION_SOLUTION | Freq: Four times a day (QID) | RESPIRATORY_TRACT | Status: DC | PRN
  Administered 2014-11-26: 2.5 mg via RESPIRATORY_TRACT

## 2014-11-26 MED ORDER — ONDANSETRON HCL 4 MG/2ML IJ SOLN
INTRAMUSCULAR | Status: AC
  Filled 2014-11-26: qty 2

## 2014-11-26 MED ORDER — PHENYLEPHRINE HCL 10 MG/ML IJ SOLN
INTRAMUSCULAR | Status: DC | PRN
  Administered 2014-11-26: 40 ug via INTRAVENOUS

## 2014-11-26 MED ORDER — NEOSTIGMINE METHYLSULFATE 10 MG/10ML IV SOLN
INTRAVENOUS | Status: AC
  Filled 2014-11-26: qty 1

## 2014-11-26 MED ORDER — HYDROMORPHONE HCL 1 MG/ML IJ SOLN
0.2500 mg | INTRAMUSCULAR | Status: DC | PRN
  Administered 2014-11-26 (×2): 0.5 mg via INTRAVENOUS

## 2014-11-26 MED ORDER — METHOCARBAMOL 1000 MG/10ML IJ SOLN
1000.0000 mg | Freq: Once | INTRAVENOUS | Status: AC
  Administered 2014-11-26: 1000 mg via INTRAVENOUS
  Filled 2014-11-26: qty 10

## 2014-11-26 MED ORDER — DEXAMETHASONE SODIUM PHOSPHATE 10 MG/ML IJ SOLN
INTRAMUSCULAR | Status: DC | PRN
  Administered 2014-11-26: 10 mg via INTRAVENOUS

## 2014-11-26 MED ORDER — GLYCOPYRROLATE 0.2 MG/ML IJ SOLN
INTRAMUSCULAR | Status: AC
  Filled 2014-11-26: qty 3

## 2014-11-26 MED ORDER — GLYCOPYRROLATE 0.2 MG/ML IJ SOLN
INTRAMUSCULAR | Status: DC | PRN
  Administered 2014-11-26: 0.6 mg via INTRAVENOUS

## 2014-11-26 MED ORDER — ALBUTEROL SULFATE (2.5 MG/3ML) 0.083% IN NEBU
INHALATION_SOLUTION | RESPIRATORY_TRACT | Status: AC
  Filled 2014-11-26: qty 3

## 2014-11-26 MED ORDER — LACTATED RINGERS IR SOLN
Status: DC | PRN
  Administered 2014-11-26: 1000 mL

## 2014-11-26 MED ORDER — BUPIVACAINE-EPINEPHRINE (PF) 0.25% -1:200000 IJ SOLN
INTRAMUSCULAR | Status: AC
  Filled 2014-11-26: qty 30

## 2014-11-26 MED ORDER — MIDAZOLAM HCL 5 MG/5ML IJ SOLN
INTRAMUSCULAR | Status: DC | PRN
  Administered 2014-11-26: 2 mg via INTRAVENOUS

## 2014-11-26 MED ORDER — ALBUTEROL SULFATE HFA 108 (90 BASE) MCG/ACT IN AERS
INHALATION_SPRAY | RESPIRATORY_TRACT | Status: AC
  Filled 2014-11-26: qty 6.7

## 2014-11-26 MED ORDER — ROCURONIUM BROMIDE 100 MG/10ML IV SOLN
INTRAVENOUS | Status: DC | PRN
  Administered 2014-11-26: 10 mg via INTRAVENOUS
  Administered 2014-11-26: 5 mg via INTRAVENOUS
  Administered 2014-11-26: 40 mg via INTRAVENOUS

## 2014-11-26 MED ORDER — HYDROCODONE-ACETAMINOPHEN 5-325 MG PO TABS: 1.0000 | ORAL_TABLET | Freq: Four times a day (QID) | ORAL | Status: DC | PRN

## 2014-11-26 SURGICAL SUPPLY — 39 items
APPLIER CLIP 5 13 M/L LIGAMAX5 (MISCELLANEOUS) ×3
APPLIER CLIP ROT 10 11.4 M/L (STAPLE) ×3
CABLE HIGH FREQUENCY MONO STRZ (ELECTRODE) ×3 IMPLANT
CHLORAPREP W/TINT 26ML (MISCELLANEOUS) ×3 IMPLANT
CLIP APPLIE 5 13 M/L LIGAMAX5 (MISCELLANEOUS) ×1 IMPLANT
CLIP APPLIE ROT 10 11.4 M/L (STAPLE) ×1 IMPLANT
COVER MAYO STAND STRL (DRAPES) IMPLANT
DECANTER SPIKE VIAL GLASS SM (MISCELLANEOUS) ×3 IMPLANT
DRAPE C-ARM 42X120 X-RAY (DRAPES) ×3 IMPLANT
DRAPE LAPAROSCOPIC ABDOMINAL (DRAPES) ×3 IMPLANT
ELECT REM PT RETURN 9FT ADLT (ELECTROSURGICAL) ×3
ELECTRODE REM PT RTRN 9FT ADLT (ELECTROSURGICAL) ×1 IMPLANT
GLOVE BIOGEL M STRL SZ7.5 (GLOVE) ×3 IMPLANT
GLOVE BIOGEL PI IND STRL 7.0 (GLOVE) ×2 IMPLANT
GLOVE BIOGEL PI INDICATOR 7.0 (GLOVE) ×4
GLOVE ECLIPSE 7.0 STRL STRAW (GLOVE) ×3 IMPLANT
GLOVE SURG SS PI 6.5 STRL IVOR (GLOVE) ×3 IMPLANT
GOWN STRL REUS W/TWL XL LVL3 (GOWN DISPOSABLE) ×9 IMPLANT
HEMOSTAT SNOW SURGICEL 2X4 (HEMOSTASIS) IMPLANT
KIT BASIN OR (CUSTOM PROCEDURE TRAY) ×3 IMPLANT
LIQUID BAND (GAUZE/BANDAGES/DRESSINGS) ×3 IMPLANT
NS IRRIG 1000ML POUR BTL (IV SOLUTION) ×3 IMPLANT
PEN SKIN MARKING BROAD (MISCELLANEOUS) ×3 IMPLANT
POUCH RETRIEVAL ECOSAC 10 (ENDOMECHANICALS) IMPLANT
POUCH RETRIEVAL ECOSAC 10MM (ENDOMECHANICALS)
POUCH SPECIMEN RETRIEVAL 10MM (ENDOMECHANICALS) ×3 IMPLANT
SCISSORS LAP 5X35 DISP (ENDOMECHANICALS) ×3 IMPLANT
SET CHOLANGIOGRAPH MIX (MISCELLANEOUS) ×3 IMPLANT
SET IRRIG TUBING LAPAROSCOPIC (IRRIGATION / IRRIGATOR) ×3 IMPLANT
SLEEVE XCEL OPT CAN 5 100 (ENDOMECHANICALS) ×6 IMPLANT
SUT MNCRL AB 4-0 PS2 18 (SUTURE) ×3 IMPLANT
SUT VIC AB 0 UR5 27 (SUTURE) ×3 IMPLANT
SUT VICRYL 0 UR6 27IN ABS (SUTURE) ×3 IMPLANT
TOWEL OR 17X26 10 PK STRL BLUE (TOWEL DISPOSABLE) ×3 IMPLANT
TOWEL OR NON WOVEN STRL DISP B (DISPOSABLE) ×3 IMPLANT
TRAY LAPAROSCOPIC (CUSTOM PROCEDURE TRAY) ×3 IMPLANT
TROCAR BLADELESS OPT 5 100 (ENDOMECHANICALS) ×3 IMPLANT
TROCAR XCEL BLUNT TIP 100MML (ENDOMECHANICALS) ×3 IMPLANT
TROCAR XCEL NON-BLD 11X100MML (ENDOMECHANICALS) IMPLANT

## 2014-11-26 NOTE — Discharge Instructions (Signed)
[facility] LAPAROSCOPIC SURGERY: POST OP INSTRUCTIONS Always review your discharge instruction sheet given to you by the facility where your surgery was performed. IF YOU HAVE DISABILITY OR FAMILY LEAVE FORMS, YOU MUST BRING THEM TO THE OFFICE FOR PROCESSING.   DO NOT GIVE THEM TO YOUR DOCTOR.  1. A prescription for pain medication may be given to you upon discharge.  Take your pain medication as prescribed, if needed.  If narcotic pain medicine is not needed, then you may take acetaminophen (Tylenol) or ibuprofen (Advil) as needed. 2. Take your usually prescribed medications unless otherwise directed. 3. If you need a refill on your pain medication, please contact your pharmacy.  They will contact our office to request authorization. Prescriptions will not be filled after 5pm or on week-ends. 4. You should follow a light diet the first few days after arrival home, such as soup and crackers, etc.  Be sure to include lots of fluids daily. 5. Most patients will experience some swelling and bruising in the area of the incisions.  Ice packs will help.  Swelling and bruising can take several days to resolve.  6. It is common to experience some constipation if taking pain medication after surgery.  Increasing fluid intake and taking a stool softener (such as Colace) will usually help or prevent this problem from occurring.  A mild laxative (Milk of Magnesia or Miralax) should be taken according to package instructions if there are no bowel movements after 48 hours. 7.   If your surgeon used skin glue on the incision, you may shower in 24 hours.  The glue will flake off over the next 2-3 weeks.  Any sutures or staples will be removed at the office during your follow-up visit. 8. ACTIVITIES:  You may resume regular (light) daily activities beginning the next day--such as daily self-care, walking, climbing stairs--gradually increasing activities as tolerated.  You may have sexual  intercourse when it is comfortable.  Refrain from any heavy lifting or straining until approved by your doctor. a. You may drive when you are no longer taking prescription pain medication, you can comfortably wear a seatbelt, and you can safely maneuver your car and apply brakes. 9. You should see your doctor in the office for a follow-up appointment approximately 2-3 weeks after your surgery.  Make sure that you call for this appointment within a day or two after you arrive home to insure a convenient appointment time. 10. OTHER INSTRUCTIONS: DO NOT LIFT, PUSH, OR PULL ANYTHING GREATER THAN 10 POUNDS FOR 2 WEEKS  WHEN TO CALL YOUR DOCTOR: 1. Fever over 101.0 2. Inability to urinate 3. Continued bleeding from incision. 4. Increased pain, redness, or drainage from the incision. 5. Increasing abdominal pain  The clinic staff is available to answer your questions during regular business hours.  Please dont hesitate to call and ask to speak to one of the nurses for clinical concerns.  If you have a medical emergency, go to the nearest emergency room or call 911.  A surgeon from [facility] is always on call at the hospital. [address], [address], [place], [place]  [address] ? [address] [address], [place], [place]   [address] [phone] ? [phone] ? FAX [phone] Web site: [email or web address]  General Anesthesia, Care After Refer to this sheet in the next few weeks. These instructions provide you with information on caring for yourself after your procedure. Your health care provider may also give you more specific instructions.  Your treatment has been planned according to current medical practices, but problems sometimes occur. Call your health care provider if you have any problems or questions after your procedure. WHAT TO EXPECT AFTER THE PROCEDURE After the procedure, it is typical to experience:  Sleepiness.  Nausea and vomiting. HOME CARE  INSTRUCTIONS  For the first 24 hours after general anesthesia:  Have a responsible person with you.  Do not drive a car. If you are alone, do not take public transportation.  Do not drink alcohol.  Do not take medicine that has not been prescribed by your health care provider.  Do not sign important papers or make important decisions.  You may resume a normal diet and activities as directed by your health care provider.  Change bandages (dressings) as directed.  If you have questions or problems that seem related to general anesthesia, call the hospital and ask for the anesthetist or anesthesiologist on call. SEEK MEDICAL CARE IF:  You have nausea and vomiting that continue the day after anesthesia.  You develop a rash. SEEK IMMEDIATE MEDICAL CARE IF:   You have difficulty breathing.  You have chest pain.  You have any allergic problems. Document Released: 10/31/2000 Document Revised: 07/30/2013 Document Reviewed: 02/07/2013 [facility] Patient Information 2015 [employer]. This information is not intended to replace advice given to you by your health care provider. Make sure you discuss any questions you have with your health care provider.

## 2014-11-26 NOTE — Anesthesia Procedure Notes (Signed)
Procedure Name: Intubation Date/Time: 11/26/2014 8:40 AM Performed by: Epimenio SarinJARVELA, Steward Sames R Pre-anesthesia Checklist: Patient identified, Emergency Drugs available, Suction available, Patient being monitored and Timeout performed Patient Re-evaluated:Patient Re-evaluated prior to inductionOxygen Delivery Method: Circle system utilized Preoxygenation: Pre-oxygenation with 100% oxygen Intubation Type: IV induction Ventilation: Mask ventilation without difficulty and Oral airway inserted - appropriate to patient size Laryngoscope Size: Mac and 3 Grade View: Grade I Tube type: Oral Tube size: 7.5 mm Number of attempts: 1 Airway Equipment and Method: Stylet Placement Confirmation: ETT inserted through vocal cords under direct vision,  positive ETCO2 and breath sounds checked- equal and bilateral Secured at: 23 cm Tube secured with: Tape Dental Injury: Teeth and Oropharynx as per pre-operative assessment

## 2014-11-26 NOTE — H&P (View-Only) (Signed)
Leather A. Grzelak 11/03/2014 2:16 PM Location: [facility] Patient #: [id number] DOB: 12/01/1975 Divorced / Language: English / Race: White Female  History of Present Illness (Kemper Hochman M. Gayatri Teasdale MD; 11/03/2014 2:48 PM) Patient words: GB.  The patient is a 39 year old female who presents with non-malignant abdominal pain. She is referred by Dr Vernon for evaluation of RUQ pain. The patient states that she's been having intermittent abdominal pain for 3 years. It is primarily in her right upper abdomen and radiates to her side. It generally occurs after eating. It lasts for several hours. However over the past few months the frequency and intensity of her pain has been worsening. She now has episodes at least 4 times a week. It is now associated with nausea and vomiting. It last for several hours. She also complains of feeling bloated. She denies any diarrhea or constipation. She states that she's lost about 10 pounds. She denies any acholic stools. She denies any jaundice. She does take Tylenol and Motrin and Goody's powders. She denies any heartburn or indigestion. She denies any prior abdominal surgery except for tubal ligation and appendectomy. She has been T [facility], [facility], and our ER multiple times over the past year. She has undergone numerous CT scans as well as abdominal ultrasounds. There is no evidence of gallstones. She states that she had a nuclear medicine scan down at [place] which showed a lazy gallbladder. She reports open heart surgery as a child. She does smoke half a pack per day.   Other Problems (Ammie Eversole, LPN; 11/03/2014 2:16 PM) Anxiety Disorder Asthma Depression Gastroesophageal Reflux Disease Home Oxygen Use Migraine Headache Other disease, cancer, significant illness Sleep Apnea  Past Surgical History (Ammie Eversole, LPN; 11/03/2014 2:16 PM) Resection of Stomach  Diagnostic Studies History (Ammie  Eversole, LPN; 11/03/2014 2:16 PM) Colonoscopy 1-5 years ago Mammogram never Pap Smear 1-5 years ago  Allergies (Ammie Eversole, LPN; 11/03/2014 2:18 PM) Cephalexin *CEPHALOSPORINS* Ciprofloxacin *FLUOROQUINOLONES* Morphine Sulfate (PF) *ANALGESICS - OPIOID* TraZODone HCl *ANTIDEPRESSANTS* Ketorolac Tromethamine *DERMATOLOGICALS*  Medication History (Ammie Eversole, LPN; 11/03/2014 2:20 PM) ProAir HFA (108 (90 Base)MCG/ACT Aerosol Soln, Inhalation) Active. PROzac (20MG Capsule, Oral) Active. Neurontin (300MG Capsule, Oral) Active. PriLOSEC (40MG Capsule DR, Oral) Active. Zofran (8MG Tablet, Oral) Active.  Social History (Ammie Eversole, LPN; 11/03/2014 2:16 PM) Caffeine use Coffee. No alcohol use No drug use Tobacco use Current every day smoker.  Family History (Ammie Eversole, LPN; 11/03/2014 2:16 PM) Alcohol Abuse Brother. Arthritis Mother. Cerebrovascular Accident Father. Cervical Cancer Sister. Depression Brother, Daughter, Family Members In General, Mother, Sister. Diabetes Mellitus Mother, Sister. Heart disease in female family member before age 65 Hypertension Father, Mother. Migraine Headache Mother, Sister. Respiratory Condition Mother. Seizure disorder Family Members In General.  Pregnancy / Birth History (Ammie Eversole, LPN; 11/03/2014 2:16 PM) Age at menarche 15 years. Gravida 4 Maternal age <15 Para 4 Regular periods  Review of Systems (Ammie Eversole LPN; 11/03/2014 2:16 PM) General Present- Appetite Loss and Fatigue. Not Present- Chills, Fever, Night Sweats, Weight Gain and Weight Loss. Skin Present- Dryness. Not Present- Change in Wart/Mole, Hives, Jaundice, New Lesions, Non-Healing Wounds, Rash and Ulcer. HEENT Not Present- Earache, Hearing Loss, Hoarseness, Nose Bleed, Oral Ulcers, Ringing in the Ears, Seasonal Allergies, Sinus Pain, Sore Throat, Visual Disturbances, Wears glasses/contact lenses and Yellow Eyes. Respiratory  Not Present- Bloody sputum, Chronic Cough, Difficulty Breathing, Snoring and Wheezing. Breast Not Present- Breast Mass, Breast Pain, Nipple Discharge and Skin Changes. Cardiovascular Present- Leg Cramps, Rapid   Heart Rate and Swelling of Extremities. Not Present- Chest Pain, Difficulty Breathing Lying Down, Palpitations and Shortness of Breath. Gastrointestinal Present- Abdominal Pain, Bloating, Gets full quickly at meals, Indigestion, Nausea and Vomiting. Not Present- Bloody Stool, Change in Bowel Habits, Chronic diarrhea, Constipation, Difficulty Swallowing, Excessive gas, Hemorrhoids and Rectal Pain. Female Genitourinary Not Present- Frequency, Nocturia, Painful Urination, Pelvic Pain and Urgency. Musculoskeletal Present- Swelling of Extremities. Not Present- Back Pain, Joint Pain, Joint Stiffness, Muscle Pain and Muscle Weakness. Neurological Present- Headaches and Tingling. Not Present- Decreased Memory, Fainting, Numbness, Seizures, Tremor, Trouble walking and Weakness. Psychiatric Present- Anxiety, Bipolar, Change in Sleep Pattern, Depression and Fearful. Not Present- Frequent crying. Endocrine Present- Hot flashes. Not Present- Cold Intolerance, Excessive Hunger, Hair Changes, Heat Intolerance and New Diabetes. Hematology Not Present- Easy Bruising, Excessive bleeding, Gland problems, HIV and Persistent Infections.   Vitals (Ammie Eversole LPN; 11/03/2014 2:17 PM) 11/03/2014 2:17 PM Weight: 267.6 lb Height: 67in Body Surface Area: 2.4 m Body Mass Index: 41.91 kg/m Temp.: 98F(Oral)  Pulse: 70 (Regular)  BP: 120/80 (Sitting, Left Arm, Standard)    Physical Exam (Luisfelipe Engelstad M. Yasamin Karel MD; 11/03/2014 2:43 PM) General Mental Status-Alert. General Appearance-Consistent with stated age. Hydration-Well hydrated. Voice-Normal. Note: morbidly obese   Head and Neck Head-normocephalic, atraumatic with no lesions or palpable masses. Trachea-midline. Thyroid Gland  Characteristics - normal size and consistency.  Eye Eyeball - Bilateral-Extraocular movements intact. Sclera/Conjunctiva - Bilateral-No scleral icterus.  Chest and Lung Exam Chest and lung exam reveals -quiet, even and easy respiratory effort with no use of accessory muscles and on auscultation, normal breath sounds, no adventitious sounds and normal vocal resonance. Inspection Chest Wall - Normal. Back - normal.  Breast - Did not examine.  Cardiovascular Cardiovascular examination reveals -normal heart sounds, regular rate and rhythm with no murmurs and normal pedal pulses bilaterally.  Abdomen Inspection Inspection of the abdomen reveals - No Hernias. Skin - Scar - no surgical scars. Palpation/Percussion Palpation and Percussion of the abdomen reveal - Soft, Non Tender, No Rebound tenderness, No Rigidity (guarding) and No hepatosplenomegaly. Auscultation Auscultation of the abdomen reveals - Bowel sounds normal.  Peripheral Vascular Upper Extremity Palpation - Pulses bilaterally normal.  Neurologic Neurologic evaluation reveals -alert and oriented x 3 with no impairment of recent or remote memory. Mental Status-Normal.  Neuropsychiatric The patient's mood and affect are described as -normal. Judgment and Insight-insight is appropriate concerning matters relevant to self.  Musculoskeletal Normal Exam - Left-Upper Extremity Strength Normal and Lower Extremity Strength Normal. Normal Exam - Right-Upper Extremity Strength Normal and Lower Extremity Strength Normal.  Lymphatic Head & Neck  General Head & Neck Lymphatics: Bilateral - Description - Normal. Axillary - Did not examine. Femoral & Inguinal - Did not examine.    Assessment & Plan (Nikala Walsworth M. Kotaro Buer MD; 11/03/2014 2:47 PM) BILIARY COLIC (574.20  K80.50) Impression: I reviewed her records and imaging from [facility] as well as [facility] as well as our emergency room. 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 I do not have access to her nuclear medicine imaging. However her symptoms are pretty classic for biliary colic. Therefore I recommended proceeding with cholecystectomy. Current Plans  Schedule for Surgery I believe the patient's symptoms are consistent with gallbladder disease.  We discussed gallbladder disease. The patient was given educational material. We discussed non-operative and operative management. We discussed the signs & symptoms of acute cholecystitis  I discussed laparoscopic cholecystectomy with IOC in detail.   The patient was given educational material as well as diagrams detailing the procedure. We discussed the risks and benefits of a laparoscopic cholecystectomy including, but not limited to bleeding, infection, injury to surrounding structures such as the intestine or liver, bile leak, retained gallstones, need to convert to an open procedure, prolonged diarrhea, blood clots such as DVT, common bile duct injury, anesthesia risks, and possible need for additional procedures. We discussed the typical post-operative recovery course. I explained that the likelihood of improvement of their symptoms is good.  The patient has elected to proceed with surgery. SLEEP APNEA IN ADULT (327.23  G47.33) Home Oxygen Use DEPRESSION, CONTROLLED (311  F32.9) ANXIETY (300.00  F41.9)  Daquan Crapps M. Markeith Jue, MD, FACS General, Bariatric, & Minimally Invasive Surgery [facility] [place]  

## 2014-11-26 NOTE — Interval H&P Note (Signed)
History and Physical Interval Note:  11/26/2014 8:18 AM  Robin Frey  has presented today for surgery, with the diagnosis of Biliary Colic  The various methods of treatment have been discussed with the patient and family. After consideration of risks, benefits and other options for treatment, the patient has consented to  Procedure(s): LAPAROSCOPIC CHOLECYSTECTOMY WITH INTRAOPERATIVE CHOLANGIOGRAM (N/A) as a surgical intervention .  The patient's history has been reviewed, patient examined, no change in status, stable for surgery.  I have reviewed the patient's chart and labs.  Questions were answered to the patient's satisfaction.    Mary SellaEric M. Andrey CampanileWilson, MD, FACS General, Bariatric, & Minimally Invasive Surgery [facility], [place]  [facility] Emergency DepartmentWILSON,Latonyia Lopata M

## 2014-11-26 NOTE — Anesthesia Postprocedure Evaluation (Signed)
  Anesthesia Post-op Note  Patient: Robin LitesMargie A Frey  Procedure(s) Performed: Procedure(s) (LRB): LAPAROSCOPIC CHOLECYSTECTOMY WITH INTRAOPERATIVE CHOLANGIOGRAM (N/A)  Patient Location: PACU  Anesthesia Type: General  Level of Consciousness: awake and alert   Airway and Oxygen Therapy: Patient Spontanous Breathing  Post-op Pain: mild  Post-op Assessment: Post-op Vital signs reviewed, Patient's Cardiovascular Status Stable, Respiratory Function Stable, Patent Airway and No signs of Nausea or vomiting  Last Vitals:  Filed Vitals:   11/26/14 1230  BP: 113/63  Pulse: 74  Temp: 36.6 C  Resp: 16    Post-op Vital Signs: stable   Complications: No apparent anesthesia complications

## 2014-11-26 NOTE — Op Note (Signed)
LAURANN MCMORRIS [id number] [birth year] 11/26/2014  Laparoscopic Cholecystectomy with IOC Procedure Note  Indications: This patient presents with symptomatic gallbladder disease and will undergo laparoscopic cholecystectomy.  Pre-operative Diagnosis: biliary colic  Post-operative Diagnosis: Same  Surgeon: Atilano Ina   Assistants: none  Anesthesia: General endotracheal anesthesia  ASA Class: 3  Procedure Details  The patient was seen again in the Holding Room. The risks, benefits, complications, treatment options, and expected outcomes were discussed with the patient. The possibilities of reaction to medication, pulmonary aspiration, perforation of viscus, bleeding, recurrent infection, finding a normal gallbladder, the need for additional procedures, failure to diagnose a condition, the possible need to convert to an open procedure, and creating a complication requiring transfusion or operation were discussed with the patient. The likelihood of improving the patient's symptoms with return to their baseline status is good.  The patient and/or family concurred with the proposed plan, giving informed consent. The site of surgery properly noted. The patient was taken to Operating Room, identified as Earl Lites and the procedure verified as Laparoscopic Cholecystectomy with Intraoperative Cholangiogram. A Time Out was held and the above information confirmed. Antibiotic prophylaxis was administered.   Prior to the induction of general anesthesia, antibiotic prophylaxis was administered. General endotracheal anesthesia was then administered and tolerated well. After the induction, the abdomen was prepped with Chloraprep and draped in the sterile fashion. The patient was positioned in the supine position.  Local anesthetic agent was injected into the skin near the umbilicus and an incision made. We dissected down to the abdominal fascia with blunt dissection.  The fascia was incised vertically  and we entered the peritoneal cavity bluntly.  A pursestring suture of 0-Vicryl was placed around the fascial opening.  The Hasson cannula was inserted and secured with the stay suture.  Pneumoperitoneum was then created with CO2 and tolerated well without any adverse changes in the patient's vital signs. An 5-mm port was placed in the subxiphoid position.  Two 5-mm ports were placed in the right upper quadrant. All skin incisions were infiltrated with a local anesthetic agent before making the incision and placing the trocars.   We positioned the patient in reverse Trendelenburg, tilted slightly to the patient's left.  The gallbladder was identified, the fundus grasped and retracted cephalad. Adhesions were lysed bluntly and with the electrocautery where indicated, taking care not to injure any adjacent organs or viscus. The infundibulum was grasped and retracted laterally, exposing the peritoneum overlying the triangle of Calot. This was then divided and exposed in a blunt fashion. A critical view of the cystic duct and cystic artery was obtained.  The cystic duct was clearly identified and bluntly dissected circumferentially. I decided to go ahead and take the cystic artery since it was somewhat in the way of getting a clip around the cystic duct.  The cystic artery was identified, dissected free, ligated with clips and divided. The cystic duct was ligated with a clip distally.   An incision was made in the cystic duct and the [facility] cholangiogram catheter introduced. The catheter was secured using a clip. A cholangiogram was then obtained which showed good visualization of the distal and proximal biliary tree with no sign of filling defects or obstruction.  Contrast flowed easily into the duodenum. The catheter was then removed.   The cystic duct was then ligated with clips and divided.   The gallbladder was dissected from the liver bed in retrograde fashion with the electrocautery. The gallbladder was  removed and  placed in an Endocatch sac.  The gallbladder and Endocatch sac were then removed through the umbilical port site. The liver bed was irrigated and inspected. Hemostasis was achieved with the electrocautery. Copious irrigation was utilized and was repeatedly aspirated until clear.  The pursestring suture was used to close the umbilical fascia. I did place an additional 0 vicryl suture at the umbilical fascia.    We again inspected the right upper quadrant for hemostasis.  The umbilical closure was inspected and there was no air leak and nothing trapped within the closure. Pneumoperitoneum was released as we removed the trocars.  4-0 Monocryl was used to close the skin.   Liquid band exceed was applied. The patient was then extubated and brought to the recovery room in stable condition. Instrument, sponge, and needle counts were correct at closure and at the conclusion of the case.   Findings: Critical view; fatty non-nodular liver  Estimated Blood Loss: Minimal         Drains: none         Specimens: Gallbladder           Complications: None; patient tolerated the procedure well.         Disposition: PACU - hemodynamically stable.         Condition: stable  Mary SellaEric M. Andrey CampanileWilson, MD, FACS General, Bariatric, & Minimally Invasive Surgery [facility], [place]

## 2014-11-26 NOTE — Anesthesia Preprocedure Evaluation (Addendum)
Anesthesia Evaluation  Patient identified by MRN, date of birth, ID band Patient awake    Reviewed: Allergy & Precautions, NPO status , Patient's Chart, lab work & pertinent test results  Airway Mallampati: II  TM Distance: >3 FB Neck ROM: Full    Dental  (+)    Pulmonary asthma , sleep apnea , pneumonia -, resolved, Current Smoker,  breath sounds clear to auscultation  Pulmonary exam normal       Cardiovascular + dysrhythmias Rhythm:Regular Rate:Normal     Neuro/Psych PSYCHIATRIC DISORDERS Depression Bipolar Disorder negative neurological ROS     GI/Hepatic Neg liver ROS, GERD-  ,  Endo/Other  Morbid obesity  Renal/GU negative Renal ROS  negative genitourinary   Musculoskeletal negative musculoskeletal ROS (+)   Abdominal (+) + obese,   Peds negative pediatric ROS (+)  Hematology negative hematology ROS (+)   Anesthesia Other Findings   Reproductive/Obstetrics negative OB ROS                            Anesthesia Physical Anesthesia Plan  ASA: III  Anesthesia Plan: General   Post-op Pain Management:    Induction: Intravenous  Airway Management Planned: Oral ETT  Additional Equipment:   Intra-op Plan:   Post-operative Plan: Extubation in OR  Informed Consent: I have reviewed the patients History and Physical, chart, labs and discussed the procedure including the risks, benefits and alternatives for the proposed anesthesia with the patient or authorized representative who has indicated his/her understanding and acceptance.   Dental advisory given  Plan Discussed with: CRNA  Anesthesia Plan Comments:         Anesthesia Quick Evaluation

## 2014-11-26 NOTE — Transfer of Care (Signed)
Immediate Anesthesia Transfer of Care Note  Patient: Robin Frey  Procedure(s) Performed: Procedure(s): LAPAROSCOPIC CHOLECYSTECTOMY WITH INTRAOPERATIVE CHOLANGIOGRAM (N/A)  Patient Location: PACU  Anesthesia Type:General  Level of Consciousness:  sedated, patient cooperative and responds to stimulation  Airway & Oxygen Therapy:Patient Spontanous Breathing and Patient connected to face mask oxgen  Post-op Assessment:  Report given to PACU RN and Post -op Vital signs reviewed and stable  Post vital signs:  Reviewed and stable  Last Vitals:  Filed Vitals:   11/26/14 0639  BP: 126/81  Pulse: 91  Temp: 36.6 C  Resp: 18    Complications: No apparent anesthesia complications

## 2014-11-27 ENCOUNTER — Encounter (HOSPITAL_COMMUNITY): Payer: Self-pay | Admitting: General Surgery

## 2014-12-04 ENCOUNTER — Emergency Department (HOSPITAL_BASED_OUTPATIENT_CLINIC_OR_DEPARTMENT_OTHER)
Admission: EM | Admit: 2014-12-04 | Discharge: 2014-12-04 | Disposition: A | Discharge status: Home / Self Care | Payer: Medicare Other | Attending: Emergency Medicine | Admitting: Emergency Medicine

## 2014-12-04 ENCOUNTER — Encounter (HOSPITAL_BASED_OUTPATIENT_CLINIC_OR_DEPARTMENT_OTHER): Payer: Self-pay | Admitting: *Deleted

## 2014-12-04 DIAGNOSIS — Z79899 Other long term (current) drug therapy: Secondary | ICD-10-CM | POA: Diagnosis not present

## 2014-12-04 DIAGNOSIS — I499 Cardiac arrhythmia, unspecified: Secondary | ICD-10-CM | POA: Diagnosis not present

## 2014-12-04 DIAGNOSIS — G8918 Other acute postprocedural pain: Secondary | ICD-10-CM | POA: Insufficient documentation

## 2014-12-04 DIAGNOSIS — G629 Polyneuropathy, unspecified: Secondary | ICD-10-CM | POA: Diagnosis not present

## 2014-12-04 DIAGNOSIS — Z9049 Acquired absence of other specified parts of digestive tract: Secondary | ICD-10-CM | POA: Diagnosis not present

## 2014-12-04 DIAGNOSIS — Z8701 Personal history of pneumonia (recurrent): Secondary | ICD-10-CM | POA: Diagnosis not present

## 2014-12-04 DIAGNOSIS — Z9889 Other specified postprocedural states: Secondary | ICD-10-CM | POA: Diagnosis not present

## 2014-12-04 DIAGNOSIS — K219 Gastro-esophageal reflux disease without esophagitis: Secondary | ICD-10-CM | POA: Insufficient documentation

## 2014-12-04 DIAGNOSIS — J45909 Unspecified asthma, uncomplicated: Secondary | ICD-10-CM | POA: Insufficient documentation

## 2014-12-04 DIAGNOSIS — R112 Nausea with vomiting, unspecified: Secondary | ICD-10-CM | POA: Insufficient documentation

## 2014-12-04 DIAGNOSIS — F319 Bipolar disorder, unspecified: Secondary | ICD-10-CM | POA: Insufficient documentation

## 2014-12-04 DIAGNOSIS — G4730 Sleep apnea, unspecified: Secondary | ICD-10-CM | POA: Insufficient documentation

## 2014-12-04 DIAGNOSIS — R1033 Periumbilical pain: Secondary | ICD-10-CM | POA: Diagnosis not present

## 2014-12-04 DIAGNOSIS — Z720 Tobacco use: Secondary | ICD-10-CM | POA: Insufficient documentation

## 2014-12-04 NOTE — ED Provider Notes (Signed)
CSN: [id number]     Arrival date & time 12/04/14  1847 History   First MD Initiated Contact with Patient 12/04/14 1928     Chief Complaint  Patient presents with  . Abdominal Pain     (Consider location/radiation/quality/duration/timing/severity/associated sxs/prior Treatment) HPI Comments: 39 y/o F c/o periumbilical abdominal pain x 8 days. Had a lap choley on 4/20 by Dr. Andrey Campanile without any complications, however since then, has been experiencing intermittent "burning and pressure" to her umbilicus. States she has only taken a few of the vicodin prescribed with minimal relief. Admits to nausea with a few episodes of NBNB emesis, last emesis yesterday. Taking zofran with significant relief. Tolerated PO today. BM normal. Last BM at 1630 today. No diarrhea or constipation. Denies fevers.  Patient is a 39 y.o. female presenting with abdominal pain. The history is provided by the patient.  Abdominal Pain Associated symptoms: nausea and vomiting     Past Medical History  Diagnosis Date  . Neuropathy   . Bipolar disorder   . GERD (gastroesophageal reflux disease)   . Depression   . Asthma   . Sleep apnea     nasal cannula at night  . Dysrhythmia     pt states "one MD heard arrhythmia, others have not"  . Pneumonia     multiple accounts   Past Surgical History  Procedure Laterality Date  . 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 Tubal ligation    . Appendectomy    . Eye surgery      tear duct surgery as child  . Cholecystectomy N/A 11/26/2014    Procedure: LAPAROSCOPIC CHOLECYSTECTOMY WITH INTRAOPERATIVE CHOLANGIOGRAM;  Surgeon: Robin Adu, MD;  Location: [facility];  Service: General;  Laterality: N/A;   Family History  Problem Relation Age of Onset  . Diabetes Other   . Hyperlipidemia Other   . Hypertension Other   . Stroke Other   . Asthma Other   . Cancer Other    History  Substance Use Topics  . Smoking status: Heavy Tobacco  Smoker -- 1.00 packs/day for 14 years    Types: Cigarettes  . Smokeless tobacco: Not on file  . Alcohol Use: No   OB History    No data available     Review of Systems  Gastrointestinal: Positive for nausea, vomiting and abdominal pain.  All other systems reviewed and are negative.     Allergies  Cephalexin; Ciprofloxacin; Morphine and related; Trazodone and nefazodone; and Ketorolac tromethamine  Home Medications   Prior to Admission medications   Medication Sig Start Date End Date Taking? Authorizing Provider  albuterol (PROAIR HFA) 108 (90 BASE) MCG/ACT inhaler Inhale 2 puffs into the lungs every 6 (six) hours as needed for wheezing or shortness of breath.    Historical Provider, MD  diazepam (VALIUM) 5 MG tablet Take 5 mg by mouth every 6 (six) hours as needed for anxiety.    Historical Provider, MD  dicyclomine (BENTYL) 20 MG tablet Take 1 tablet (20 mg total) by mouth every 6 (six) hours as needed (for abdominal cramping). 10/19/14   Robin Molpus, MD  FLUoxetine (PROZAC) 40 MG capsule Take 40 mg by mouth every morning.    Historical Provider, MD  gabapentin (NEURONTIN) 300 MG capsule Take 300 mg by mouth at bedtime.    Historical Provider, MD  HYDROcodone-acetaminophen (NORCO) 5-325 MG per tablet Take 1-2 tablets by mouth every 6 (  six) hours as needed for moderate pain. 11/26/14   Robin AduEric Wilson, MD  omeprazole (PRILOSEC) 40 MG capsule Take 40 mg by mouth 2 (two) times daily.     Historical Provider, MD  ondansetron (ZOFRAN ODT) 8 MG disintegrating tablet Take 1 tablet (8 mg total) by mouth every 8 (eight) hours as needed for nausea or vomiting. 11/02/14   Robin Raceravid Yelverton, MD   BP 105/56 mmHg  Pulse 75  Temp(Src) 98.9 F (37.2 C) (Oral)  Resp 22  Ht 5\' 7"  (1.702 m)  Wt 258 lb 9.6 oz (117.3 kg)  BMI 40.49 kg/m2  SpO2 99%  LMP 11/10/2014 (Within Days) Physical Exam  Constitutional: She is oriented to person, place, and time. She appears well-developed and well-nourished. No  distress.  Obese.  HENT:  Head: Normocephalic and atraumatic.  Mouth/Throat: Oropharynx is clear and moist.  Eyes: Conjunctivae and EOM are normal.  Neck: Normal range of motion. Neck supple.  Cardiovascular: Normal rate, regular rhythm and normal heart sounds.   Pulmonary/Chest: Effort normal and breath sounds normal. No respiratory distress.  Abdominal: Normal appearance. She exhibits no distension. There is no rigidity, no rebound and no guarding.  Four well appearing incisions on abdomen. Mild tenderness to umbilical incision. Abdomen soft with no peritoneal signs.  Musculoskeletal: Normal range of motion. She exhibits no edema.  Neurological: She is alert and oriented to person, place, and time. No sensory deficit.  Skin: Skin is warm and dry.  Psychiatric: She has a normal mood and affect. Her behavior is normal.  Nursing note and vitals reviewed.   ED Course  Procedures (including critical care time) Labs Review Labs Reviewed - No data to display  Imaging Review No results found.   EKG Interpretation None      MDM   Final diagnoses:  Abdominal pain, periumbilical  Post-op pain   Non-toxic appearing, NAD. AFVSS. Abdomen soft with no peritoneal signs. Incisions healing well. Tenderness over the incision around umbilicus. Reassurance given. Doubt obstruction. Has normal BM, no active vomiting, tolerating PO today. Advised her to continue vicodin, and take as prescribed, along with zofran. Advised follow-up with surgery, call tomorrow to schedule an appointment. Stable for discharge. Return precautions given. Patient states understanding of treatment care plan and is agreeable.  Robin SpeedRobyn M Bexleigh Theriault, PA-C 12/04/14 1951  Robin Nayobert Beaton, MD 12/05/14 [phone]

## 2014-12-04 NOTE — Discharge Instructions (Signed)
Continue the pain medications prescribed to you by general surgery. Continue taking Zofran for nausea.  Abdominal Pain Many things can cause abdominal pain. Usually, abdominal pain is not caused by a disease and will improve without treatment. It can often be observed and treated at home. Your health care provider will do a physical exam and possibly order blood tests and X-rays to help determine the seriousness of your pain. However, in many cases, more time must pass before a clear cause of the pain can be found. Before that point, your health care provider may not know if you need more testing or further treatment. HOME CARE INSTRUCTIONS  Monitor your abdominal pain for any changes. The following actions may help to alleviate any discomfort you are experiencing:  Only take over-the-counter or prescription medicines as directed by your health care provider.  Do not take laxatives unless directed to do so by your health care provider.  Try a clear liquid diet (broth, tea, or water) as directed by your health care provider. Slowly move to a bland diet as tolerated. SEEK MEDICAL CARE IF:  You have unexplained abdominal pain.  You have abdominal pain associated with nausea or diarrhea.  You have pain when you urinate or have a bowel movement.  You experience abdominal pain that wakes you in the night.  You have abdominal pain that is worsened or improved by eating food.  You have abdominal pain that is worsened with eating fatty foods.  You have a fever. SEEK IMMEDIATE MEDICAL CARE IF:   Your pain does not go away within 2 hours.  You keep throwing up (vomiting).  Your pain is felt only in portions of the abdomen, such as the right side or the left lower portion of the abdomen.  You pass bloody or black tarry stools. MAKE SURE YOU:  Understand these instructions.   Will watch your condition.   Will get help right away if you are not doing well or get worse.  Document  Released: 05/04/2005 Document Revised: 07/30/2013 Document Reviewed: 04/03/2013 [facility] Patient Information 2015 [place], [place]. This information is not intended to replace advice given to you by your health care provider. Make sure you discuss any questions you have with your health care provider.

## 2014-12-04 NOTE — ED Notes (Signed)
Pt amb to triage with quick steady gait in nad. Pt reports lap choley on 4/20, since then has had intermittent "burning and pressure" pains to her mid abd. Pt reports bm's are loose, last bm 4/20, and some emesis despite taking her zofran.

## 2014-12-07 ENCOUNTER — Emergency Department (HOSPITAL_COMMUNITY)
Admission: EM | Admit: 2014-12-07 | Discharge: 2014-12-08 | Disposition: A | Discharge status: Home / Self Care | Payer: Medicare Other | Attending: Emergency Medicine | Admitting: Emergency Medicine

## 2014-12-07 ENCOUNTER — Encounter (HOSPITAL_COMMUNITY): Payer: Self-pay | Admitting: Emergency Medicine

## 2014-12-07 DIAGNOSIS — K219 Gastro-esophageal reflux disease without esophagitis: Secondary | ICD-10-CM | POA: Diagnosis not present

## 2014-12-07 DIAGNOSIS — Z79899 Other long term (current) drug therapy: Secondary | ICD-10-CM | POA: Insufficient documentation

## 2014-12-07 DIAGNOSIS — I499 Cardiac arrhythmia, unspecified: Secondary | ICD-10-CM | POA: Diagnosis not present

## 2014-12-07 DIAGNOSIS — Z720 Tobacco use: Secondary | ICD-10-CM | POA: Diagnosis not present

## 2014-12-07 DIAGNOSIS — Z9981 Dependence on supplemental oxygen: Secondary | ICD-10-CM | POA: Insufficient documentation

## 2014-12-07 DIAGNOSIS — M791 Myalgia: Secondary | ICD-10-CM | POA: Insufficient documentation

## 2014-12-07 DIAGNOSIS — R55 Syncope and collapse: Secondary | ICD-10-CM | POA: Diagnosis not present

## 2014-12-07 DIAGNOSIS — J45909 Unspecified asthma, uncomplicated: Secondary | ICD-10-CM | POA: Diagnosis not present

## 2014-12-07 DIAGNOSIS — G629 Polyneuropathy, unspecified: Secondary | ICD-10-CM | POA: Insufficient documentation

## 2014-12-07 DIAGNOSIS — F319 Bipolar disorder, unspecified: Secondary | ICD-10-CM | POA: Diagnosis not present

## 2014-12-07 DIAGNOSIS — R112 Nausea with vomiting, unspecified: Secondary | ICD-10-CM | POA: Insufficient documentation

## 2014-12-07 DIAGNOSIS — R1011 Right upper quadrant pain: Secondary | ICD-10-CM

## 2014-12-07 DIAGNOSIS — R531 Weakness: Secondary | ICD-10-CM | POA: Diagnosis not present

## 2014-12-07 DIAGNOSIS — Z3202 Encounter for pregnancy test, result negative: Secondary | ICD-10-CM | POA: Insufficient documentation

## 2014-12-07 DIAGNOSIS — M79661 Pain in right lower leg: Secondary | ICD-10-CM | POA: Diagnosis not present

## 2014-12-07 DIAGNOSIS — G4730 Sleep apnea, unspecified: Secondary | ICD-10-CM | POA: Insufficient documentation

## 2014-12-07 DIAGNOSIS — Z8701 Personal history of pneumonia (recurrent): Secondary | ICD-10-CM | POA: Insufficient documentation

## 2014-12-07 NOTE — ED Notes (Addendum)
Patient c/o fall on Saturday at 0830, falling onto gravel after feeling light headed, states she blacked out, is unsure if she struck her head, c/o soreness to forehead, no obvious signs of injury. Abrasions to right forearm. Patient c/o pain to right calf, states it is tender to touch and feels like it has a knot, is able to bend and ambulate without difficulty. Patient had lap chole on 11/26/2014, states she has had difficulty keeping foods down, states she will vomit frequently @ 1 hour after eating, is able to tolerate non carbonated liquids, and is taking Zofran. Patient has been non compliant with follow up care with surgeons office due to transportation difficulties. Patient seen at [facility] on 12/04/2014 for nausea complaints, was advised to follow up with surgery.

## 2014-12-08 ENCOUNTER — Emergency Department (HOSPITAL_COMMUNITY): Payer: Medicare Other

## 2014-12-08 ENCOUNTER — Encounter (HOSPITAL_COMMUNITY): Payer: Self-pay | Admitting: Radiology

## 2014-12-08 DIAGNOSIS — R1011 Right upper quadrant pain: Secondary | ICD-10-CM | POA: Diagnosis not present

## 2014-12-08 LAB — URINALYSIS, ROUTINE W REFLEX MICROSCOPIC
BILIRUBIN URINE: NEGATIVE
GLUCOSE, UA: NEGATIVE mg/dL
Ketones, ur: NEGATIVE mg/dL
LEUKOCYTES UA: NEGATIVE
NITRITE: NEGATIVE
PROTEIN: NEGATIVE mg/dL
Specific Gravity, Urine: 1.022 (ref 1.005–1.030)
Urobilinogen, UA: 0.2 mg/dL (ref 0.0–1.0)
pH: 5.5 (ref 5.0–8.0)

## 2014-12-08 LAB — CBC WITH DIFFERENTIAL/PLATELET
BASOS ABS: 0.1 10*3/uL (ref 0.0–0.1)
Basophils Relative: 0 % (ref 0–1)
Eosinophils Absolute: 0.5 10*3/uL (ref 0.0–0.7)
Eosinophils Relative: 3 % (ref 0–5)
HCT: 37.8 % (ref 36.0–46.0)
HEMOGLOBIN: 12.5 g/dL (ref 12.0–15.0)
Lymphocytes Relative: 32 % (ref 12–46)
Lymphs Abs: 4.8 10*3/uL — ABNORMAL HIGH (ref 0.7–4.0)
MCH: 30.6 pg (ref 26.0–34.0)
MCHC: 33.1 g/dL (ref 30.0–36.0)
MCV: 92.6 fL (ref 78.0–100.0)
Monocytes Absolute: 1 10*3/uL (ref 0.1–1.0)
Monocytes Relative: 7 % (ref 3–12)
Neutro Abs: 8.8 10*3/uL — ABNORMAL HIGH (ref 1.7–7.7)
Neutrophils Relative %: 58 % (ref 43–77)
Platelets: 333 10*3/uL (ref 150–400)
RBC: 4.08 MIL/uL (ref 3.87–5.11)
RDW: 14 % (ref 11.5–15.5)
WBC: 15.1 10*3/uL — AB (ref 4.0–10.5)

## 2014-12-08 LAB — URINE MICROSCOPIC-ADD ON

## 2014-12-08 LAB — COMPREHENSIVE METABOLIC PANEL
ALBUMIN: 4.2 g/dL (ref 3.5–5.0)
ALT: 16 U/L (ref 14–54)
AST: 19 U/L (ref 15–41)
Alkaline Phosphatase: 76 U/L (ref 38–126)
Anion gap: 6 (ref 5–15)
BILIRUBIN TOTAL: 0.7 mg/dL (ref 0.3–1.2)
BUN: 15 mg/dL (ref 6–20)
CALCIUM: 9.2 mg/dL (ref 8.9–10.3)
CHLORIDE: 107 mmol/L (ref 101–111)
CO2: 27 mmol/L (ref 22–32)
CREATININE: 1.12 mg/dL — AB (ref 0.44–1.00)
GFR calc Af Amer: >60 mL/min (ref >60)
GFR calc non Af Amer: >60 mL/min (ref >60)
Glucose, Bld: 87 mg/dL (ref 70–99)
POTASSIUM: 3.9 mmol/L (ref 3.5–5.1)
Sodium: 140 mmol/L (ref 135–145)
Total Protein: 7.6 g/dL (ref 6.5–8.1)

## 2014-12-08 LAB — LIPASE, BLOOD: Lipase: 24 U/L (ref 22–51)

## 2014-12-08 LAB — PREGNANCY, URINE: Preg Test, Ur: NEGATIVE

## 2014-12-08 MED ORDER — IPRATROPIUM-ALBUTEROL 0.5-2.5 (3) MG/3ML IN SOLN
3.0000 mL | Freq: Once | RESPIRATORY_TRACT | Status: AC
  Administered 2014-12-08: 3 mL via RESPIRATORY_TRACT
  Filled 2014-12-08: qty 3

## 2014-12-08 MED ORDER — FENTANYL CITRATE (PF) 100 MCG/2ML IJ SOLN
100.0000 ug | Freq: Once | INTRAMUSCULAR | Status: AC
  Administered 2014-12-08: 100 ug via INTRAVENOUS
  Filled 2014-12-08: qty 2

## 2014-12-08 MED ORDER — ONDANSETRON HCL 4 MG/2ML IJ SOLN
4.0000 mg | Freq: Once | INTRAMUSCULAR | Status: AC
  Administered 2014-12-08: 4 mg via INTRAVENOUS
  Filled 2014-12-08: qty 2

## 2014-12-08 MED ORDER — IOHEXOL 300 MG/ML  SOLN
100.0000 mL | Freq: Once | INTRAMUSCULAR | Status: AC | PRN
  Administered 2014-12-08: 100 mL via INTRAVENOUS

## 2014-12-08 MED ORDER — SODIUM CHLORIDE 0.9 % IV BOLUS (SEPSIS)
1000.0000 mL | Freq: Once | INTRAVENOUS | Status: AC
  Administered 2014-12-08: 1000 mL via INTRAVENOUS

## 2014-12-08 MED ORDER — METOCLOPRAMIDE HCL 10 MG PO TABS: 10.0000 mg | ORAL_TABLET | Freq: Four times a day (QID) | ORAL | Status: DC

## 2014-12-08 MED ORDER — DICYCLOMINE HCL 10 MG/ML IM SOLN
20.0000 mg | Freq: Once | INTRAMUSCULAR | Status: DC
  Filled 2014-12-08: qty 2

## 2014-12-08 MED ORDER — HYDROMORPHONE HCL 1 MG/ML IJ SOLN
0.5000 mg | Freq: Once | INTRAMUSCULAR | Status: AC
  Administered 2014-12-08: 0.5 mg via INTRAVENOUS
  Filled 2014-12-08: qty 1

## 2014-12-08 MED ORDER — IOHEXOL 300 MG/ML  SOLN
50.0000 mL | Freq: Once | INTRAMUSCULAR | Status: AC | PRN
  Administered 2014-12-08: 50 mL via ORAL

## 2014-12-08 MED ORDER — ONDANSETRON 8 MG PO TBDP: 8.0000 mg | ORAL_TABLET | Freq: Three times a day (TID) | ORAL | Status: DC | PRN

## 2014-12-08 MED ORDER — HYDROCODONE-ACETAMINOPHEN 5-325 MG PO TABS: 1.0000 | ORAL_TABLET | Freq: Four times a day (QID) | ORAL | Status: DC | PRN

## 2014-12-08 MED ORDER — DICYCLOMINE HCL 20 MG PO TABS: 20.0000 mg | ORAL_TABLET | Freq: Four times a day (QID) | ORAL | Status: DC | PRN

## 2014-12-08 NOTE — ED Provider Notes (Signed)
CSN: [id number]     Arrival date & time 12/07/14  2314 History   First MD Initiated Contact with Patient 12/07/14 2344     Chief Complaint  Patient presents with  . multiple complaints      (Consider location/radiation/quality/duration/timing/severity/associated sxs/prior Treatment) HPI 39 year old female presents to the emergency department from home with complaint of right upper quadrant abdominal pain, nausea, vomiting, syncopal episode yesterday, abrasions and bruising.  Patient status post laparoscopic cholecystectomy on April 20.  She reports since that time.  She has not been able to eat solid food.  She reports about an hour after eating, she will vomit.  Patient has been able to keep down clear liquids.  She reports that she had some burning pain at her incision sites, but this is improved.  Starting last night she began to have right upper quadrant pain that is steadily been getting worse.  Patient also reports that she walked out to the mailbox Saturday morning and had a syncopal episode.  She fell, scraping her arm.  She also complains of tenderness to right medial calf.  No fevers or chills.  Abdominal swelling has gone down since surgery.  Patient reports normal bowel movements.  Patient contacted her surgery office, which prescribed her Phenergan, but she has not taken it as she does not like the way makes her feel.  She has run out of her Vicodin. Past Medical History  Diagnosis Date  . Neuropathy   . Bipolar disorder   . GERD (gastroesophageal reflux disease)   . Depression   . Asthma   . Sleep apnea     nasal cannula at night  . Dysrhythmia     pt states "one MD heard arrhythmia, others have not"  . Pneumonia     multiple accounts   Past Surgical History  Procedure Laterality Date  . 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 Tubal ligation    . Appendectomy    . Eye surgery      tear duct surgery as child  . Cholecystectomy  N/A 11/26/2014    Procedure: LAPAROSCOPIC CHOLECYSTECTOMY WITH INTRAOPERATIVE CHOLANGIOGRAM;  Surgeon: Gaynelle Adu, MD;  Location: [facility];  Service: General;  Laterality: N/A;   Family History  Problem Relation Age of Onset  . Diabetes Other   . Hyperlipidemia Other   . Hypertension Other   . Stroke Other   . Asthma Other   . Cancer Other    History  Substance Use Topics  . Smoking status: Heavy Tobacco Smoker -- 1.00 packs/day for 14 years    Types: Cigarettes  . Smokeless tobacco: Not on file  . Alcohol Use: No   OB History    No data available     Review of Systems  Cardiovascular: Negative for chest pain and palpitations.  Gastrointestinal: Positive for nausea and abdominal pain. Negative for diarrhea and constipation.  Musculoskeletal: Positive for myalgias.  Neurological: Positive for syncope and weakness.      Allergies  Cephalexin; Ciprofloxacin; Morphine and related; Trazodone and nefazodone; and Ketorolac tromethamine  Home Medications   Prior to Admission medications   Medication Sig Start Date End Date Taking? Authorizing Provider  albuterol (PROAIR HFA) 108 (90 BASE) MCG/ACT inhaler Inhale 2 puffs into the lungs every 6 (six) hours as needed for wheezing or shortness of breath.   Yes Historical Provider, MD  diazepam (VALIUM) 5 MG tablet Take 5 mg  by mouth every 6 (six) hours as needed for anxiety.   Yes Historical Provider, MD  dicyclomine (BENTYL) 20 MG tablet Take 1 tablet (20 mg total) by mouth every 6 (six) hours as needed (for abdominal cramping). 10/19/14  Yes John Molpus, MD  FLUoxetine (PROZAC) 40 MG capsule Take 40 mg by mouth every morning.   Yes Historical Provider, MD  gabapentin (NEURONTIN) 300 MG capsule Take 300 mg by mouth at bedtime.   Yes Historical Provider, MD  HYDROcodone-acetaminophen (NORCO) 5-325 MG per tablet Take 1-2 tablets by mouth every 6 (six) hours as needed for moderate pain. 11/26/14  Yes Gaynelle AduEric Wilson, MD  omeprazole (PRILOSEC)  40 MG capsule Take 40 mg by mouth 2 (two) times daily.    Yes Historical Provider, MD  ondansetron (ZOFRAN ODT) 8 MG disintegrating tablet Take 1 tablet (8 mg total) by mouth every 8 (eight) hours as needed for nausea or vomiting. 11/02/14   Loren Raceravid Yelverton, MD   BP 118/67 mmHg  Pulse 102  Temp(Src) 98 F (36.7 C) (Oral)  Resp 20  SpO2 98%  LMP 12/06/2014 (Exact Date) Physical Exam  Constitutional: She is oriented to person, place, and time. She appears well-developed and well-nourished. No distress.  HENT:  Head: Normocephalic and atraumatic.  Right Ear: External ear normal.  Left Ear: External ear normal.  Nose: Nose normal.  Mouth/Throat: Oropharynx is clear and moist.  Eyes: Conjunctivae and EOM are normal. Pupils are equal, round, and reactive to light.  Neck: Normal range of motion. Neck supple. No JVD present. No tracheal deviation present. No thyromegaly present.  Cardiovascular: Normal rate, regular rhythm, normal heart sounds and intact distal pulses.  Exam reveals no gallop and no friction rub.   No murmur heard. Pulmonary/Chest: Effort normal and breath sounds normal. No stridor. No respiratory distress. She has no wheezes. She has no rales. She exhibits no tenderness.  Abdominal: Soft. Bowel sounds are normal. She exhibits no distension and no mass. There is tenderness (tenderness epigastrium and right upper quadrant without rebound or guarding). There is no rebound and no guarding.  Musculoskeletal: Normal range of motion. She exhibits tenderness (patient has contusion to right medial calf.  There is no tenderness.  Posterior calf.  There is no cellulitis or swelling of the leg.). She exhibits no edema.  Abrasion along right mid forearm to elbow.  No signs of infection  Lymphadenopathy:    She has no cervical adenopathy.  Neurological: She is alert and oriented to person, place, and time. She displays normal reflexes. She exhibits normal muscle tone. Coordination normal.   Skin: Skin is warm and dry. No rash noted. No erythema. No pallor.  Psychiatric: She has a normal mood and affect. Her behavior is normal. Judgment and thought content normal.  Nursing note and vitals reviewed.   ED Course  Procedures (including critical care time) Labs Review Labs Reviewed  COMPREHENSIVE METABOLIC PANEL - Abnormal; Notable for the following:    Creatinine, Ser 1.12 (*)    All other components within normal limits  CBC WITH DIFFERENTIAL/PLATELET - Abnormal; Notable for the following:    WBC 15.1 (*)    Neutro Abs 8.8 (*)    Lymphs Abs 4.8 (*)    All other components within normal limits  URINALYSIS, ROUTINE W REFLEX MICROSCOPIC - Abnormal; Notable for the following:    Hgb urine dipstick MODERATE (*)    All other components within normal limits  LIPASE, BLOOD  PREGNANCY, URINE  URINE MICROSCOPIC-ADD ON  Imaging Review Ct Abdomen Pelvis W Contrast  12/08/2014   CLINICAL DATA:  Abdominal pain since cholecystectomy on 11/26/2014  EXAM: CT ABDOMEN AND PELVIS WITH CONTRAST  TECHNIQUE: Multidetector CT imaging of the abdomen and pelvis was performed using the standard protocol following bolus administration of intravenous contrast.  CONTRAST:  OMNIPAQUE IOHEXOL 300 MG/ML SOLN, 50mL OMNIPAQUE IOHEXOL 300 MG/ML SOLN  COMPARISON:  07/08/2014  FINDINGS: There is appendectomy and cholecystectomy. There are unremarkable appearances of the gallbladder fossa. There is no evidence of complication from the recent cholecystectomy. Bile ducts are normal. Liver is normal. Spleen, pancreas, adrenals and kidneys are remarkable only for a 2 mm collecting system calculus in the right lower renal pole. Mesentery and bowel are remarkable only for mild colonic diverticulosis without evidence of diverticulitis or other acute inflammatory process. Uterus and ovaries appear normal. The abdominal aorta is normal in caliber without atherosclerotic calcification. There is no adenopathy.  There  is no significant abnormality in the lower chest. There is no significant musculoskeletal abnormality.  IMPRESSION: *Unremarkable appearances of the gallbladder fossa, with no evidence of complication from the recent cholecystectomy. *Right lower pole nephrolithiasis *Diverticulosis *No acute findings are evident.   Electronically Signed   By: Ellery Plunk M.D.   On: 12/08/2014 02:29     EKG Interpretation   Date/Time:  Monday Dec 08 2014 00:08:44 EDT Ventricular Rate:  80 PR Interval:  148 QRS Duration: 103 QT Interval:  395 QTC Calculation: 456 R Axis:   -40 Text Interpretation:  Sinus rhythm Left axis deviation No significant  change since last tracing Confirmed by Maelin Kurkowski  MD, Shelene Krage ([id number]) on 12/08/2014  12:40:33 AM      MDM   Final diagnoses:  Syncope, unspecified syncope type  Non-intractable vomiting with nausea, vomiting of unspecified type  RUQ pain    39 year old female with abdominal pain starting last night and 2 weeks of nausea and vomiting with solid foods.  Patient also with syncopal episode yesterday, possibly secondary to not eating for the last 2 weeks.  Patient not orthostatic here.  Appears mildly dehydrated on exam.  EKG without tachycardia.  Plan for labs, CT abdomen pelvis to investigate for bile leak or hematoma.    Marisa Severin, MD 12/08/14 [phone]

## 2014-12-08 NOTE — ED Notes (Signed)
MD at bedside. 

## 2014-12-08 NOTE — ED Notes (Signed)
Pt transported to CT ?

## 2014-12-08 NOTE — Discharge Instructions (Signed)
Your CT scan showed no issues from your recent surgery.  Your body is healing well.  Take medications as prescribed.  The Reglan may help with the nausea and vomiting that have been having.  The Bentyl will help with abdominal cramping.  Is important for you to follow-up with her primary care doctor, as well as Designer, industrial/product.  The rest of your labs look good.  It is thought that your episode of passing out yesterday was most likely due to not eating well.  If you are unable to eat solid foods, try a liquid diet, smoothies, protein shakes and other sorts of supplements until you are feeling better.   Nausea and Vomiting Nausea is a sick feeling that often comes before throwing up (vomiting). Vomiting is a reflex where stomach contents come out of your mouth. Vomiting can cause severe loss of body fluids (dehydration). Children and elderly adults can become dehydrated quickly, especially if they also have diarrhea. Nausea and vomiting are symptoms of a condition or disease. It is important to find the cause of your symptoms. CAUSES   Direct irritation of the stomach lining. This irritation can result from increased acid production (gastroesophageal reflux disease), infection, food poisoning, taking certain medicines (such as nonsteroidal anti-inflammatory drugs), alcohol use, or tobacco use.  Signals from the brain.These signals could be caused by a headache, heat exposure, an inner ear disturbance, increased pressure in the brain from injury, infection, a tumor, or a concussion, pain, emotional stimulus, or metabolic problems.  An obstruction in the gastrointestinal tract (bowel obstruction).  Illnesses such as diabetes, hepatitis, gallbladder problems, appendicitis, kidney problems, cancer, sepsis, atypical symptoms of a heart attack, or eating disorders.  Medical treatments such as chemotherapy and radiation.  Receiving medicine that makes you sleep (general anesthetic) during  surgery. DIAGNOSIS Your caregiver may ask for tests to be done if the problems do not improve after a few days. Tests may also be done if symptoms are severe or if the reason for the nausea and vomiting is not clear. Tests may include:  Urine tests.  Blood tests.  Stool tests.  Cultures (to look for evidence of infection).  X-rays or other imaging studies. Test results can help your caregiver make decisions about treatment or the need for additional tests. TREATMENT You need to stay well hydrated. Drink frequently but in small amounts.You may wish to drink water, sports drinks, clear broth, or eat frozen ice pops or gelatin dessert to help stay hydrated.When you eat, eating slowly may help prevent nausea.There are also some antinausea medicines that may help prevent nausea. HOME CARE INSTRUCTIONS   Take all medicine as directed by your caregiver.  If you do not have an appetite, do not force yourself to eat. However, you must continue to drink fluids.  If you have an appetite, eat a normal diet unless your caregiver tells you differently.  Eat a variety of complex carbohydrates (rice, wheat, potatoes, bread), lean meats, yogurt, fruits, and vegetables.  Avoid high-fat foods because they are more difficult to digest.  Drink enough water and fluids to keep your urine clear or pale yellow.  If you are dehydrated, ask your caregiver for specific rehydration instructions. Signs of dehydration may include:  Severe thirst.  Dry lips and mouth.  Dizziness.  Dark urine.  Decreasing urine frequency and amount.  Confusion.  Rapid breathing or pulse. SEEK IMMEDIATE MEDICAL CARE IF:   You have blood or brown flecks (like coffee grounds) in your vomit.  You  have black or bloody stools.  You have a severe headache or stiff neck.  You are confused.  You have severe abdominal pain.  You have chest pain or trouble breathing.  You do not urinate at least once every 8  hours.  You develop cold or clammy skin.  You continue to vomit for longer than 24 to 48 hours.  You have a fever. MAKE SURE YOU:   Understand these instructions.  Will watch your condition.  Will get help right away if you are not doing well or get worse. Document Released: 07/25/2005 Document Revised: 10/17/2011 Document Reviewed: 12/22/2010 [facility] Patient Information 2015 [place], [place]. This information is not intended to replace advice given to you by your health care provider. Make sure you discuss any questions you have with your health care provider.  Syncope Syncope is a medical term for fainting or passing out. This means you lose consciousness and drop to the ground. People are generally unconscious for less than 5 minutes. You may have some muscle twitches for up to 15 seconds before waking up and returning to normal. Syncope occurs more often in older adults, but it can happen to anyone. While most causes of syncope are not dangerous, syncope can be a sign of a serious medical problem. It is important to seek medical care.  CAUSES  Syncope is caused by a sudden drop in blood flow to the brain. The specific cause is often not determined. Factors that can bring on syncope include:  Taking medicines that lower blood pressure.  Sudden changes in posture, such as standing up quickly.  Taking more medicine than prescribed.  Standing in one place for too long.  Seizure disorders.  Dehydration and excessive exposure to heat.  Low blood sugar (hypoglycemia).  Straining to have a bowel movement.  Heart disease, irregular heartbeat, or other circulatory problems.  Fear, emotional distress, seeing blood, or severe pain. SYMPTOMS  Right before fainting, you may:  Feel dizzy or light-headed.  Feel nauseous.  See all white or all black in your field of vision.  Have cold, clammy skin. DIAGNOSIS  Your health care provider will ask about your symptoms, perform a  physical exam, and perform an electrocardiogram (ECG) to record the electrical activity of your heart. Your health care provider may also perform other heart or blood tests to determine the cause of your syncope which may include:  Transthoracic echocardiogram (TTE). During echocardiography, sound waves are used to evaluate how blood flows through your heart.  Transesophageal echocardiogram (TEE).  Cardiac monitoring. This allows your health care provider to monitor your heart rate and rhythm in real time.  Holter monitor. This is a portable device that records your heartbeat and can help diagnose heart arrhythmias. It allows your health care provider to track your heart activity for several days, if needed.  Stress tests by exercise or by giving medicine that makes the heart beat faster. TREATMENT  In most cases, no treatment is needed. Depending on the cause of your syncope, your health care provider may recommend changing or stopping some of your medicines. HOME CARE INSTRUCTIONS  Have someone stay with you until you feel stable.  Do not drive, use machinery, or play sports until your health care provider says it is okay.  Keep all follow-up appointments as directed by your health care provider.  Lie down right away if you start feeling like you might faint. Breathe deeply and steadily. Wait until all the symptoms have passed.  Drink enough  fluids to keep your urine clear or pale yellow.  If you are taking blood pressure or heart medicine, get up slowly and take several minutes to sit and then stand. This can reduce dizziness. SEEK IMMEDIATE MEDICAL CARE IF:   You have a severe headache.  You have unusual pain in the chest, abdomen, or back.  You are bleeding from your mouth or rectum, or you have black or tarry stool.  You have an irregular or very fast heartbeat.  You have pain with breathing.  You have repeated fainting or seizure-like jerking during an episode.  You  faint when sitting or lying down.  You have confusion.  You have trouble walking.  You have severe weakness.  You have vision problems. If you fainted, call your local emergency services (911 in [place] Do not drive yourself to the hospital.  MAKE SURE YOU:  Understand these instructions.  Will watch your condition.  Will get help right away if you are not doing well or get worse. Document Released: 07/25/2005 Document Revised: 07/30/2013 Document Reviewed: 09/23/2011 [facility] Patient Information 2015 [place], [place]. This information is not intended to replace advice given to you by your health care provider. Make sure you discuss any questions you have with your health care provider.

## 2015-12-22 DIAGNOSIS — M545 Low back pain: Secondary | ICD-10-CM | POA: Insufficient documentation

## 2015-12-22 DIAGNOSIS — R112 Nausea with vomiting, unspecified: Secondary | ICD-10-CM | POA: Diagnosis not present

## 2015-12-22 DIAGNOSIS — F17210 Nicotine dependence, cigarettes, uncomplicated: Secondary | ICD-10-CM | POA: Insufficient documentation

## 2015-12-22 DIAGNOSIS — E669 Obesity, unspecified: Secondary | ICD-10-CM | POA: Insufficient documentation

## 2015-12-22 DIAGNOSIS — R300 Dysuria: Secondary | ICD-10-CM | POA: Diagnosis not present

## 2015-12-22 DIAGNOSIS — J45909 Unspecified asthma, uncomplicated: Secondary | ICD-10-CM | POA: Diagnosis not present

## 2015-12-23 ENCOUNTER — Emergency Department (HOSPITAL_COMMUNITY)
Admission: EM | Admit: 2015-12-23 | Discharge: 2015-12-23 | Disposition: A | Discharge status: Home / Self Care | Payer: Medicare Other | Attending: Emergency Medicine | Admitting: Emergency Medicine

## 2015-12-23 ENCOUNTER — Encounter (HOSPITAL_COMMUNITY): Payer: Self-pay | Admitting: Emergency Medicine

## 2015-12-23 DIAGNOSIS — M545 Low back pain: Secondary | ICD-10-CM | POA: Diagnosis not present

## 2015-12-23 HISTORY — DX: Obesity, unspecified: E66.9

## 2015-12-23 LAB — COMPREHENSIVE METABOLIC PANEL
ALK PHOS: 63 U/L (ref 38–126)
ALT: 20 U/L (ref 14–54)
ANION GAP: 14 (ref 5–15)
AST: 20 U/L (ref 15–41)
Albumin: 3.6 g/dL (ref 3.5–5.0)
BILIRUBIN TOTAL: 0.8 mg/dL (ref 0.3–1.2)
BUN: 17 mg/dL (ref 6–20)
CALCIUM: 9.5 mg/dL (ref 8.9–10.3)
CO2: 21 mmol/L — ABNORMAL LOW (ref 22–32)
Chloride: 104 mmol/L (ref 101–111)
Creatinine, Ser: 1.08 mg/dL — ABNORMAL HIGH (ref 0.44–1.00)
GLUCOSE: 116 mg/dL — AB (ref 65–99)
POTASSIUM: 4.1 mmol/L (ref 3.5–5.1)
Sodium: 139 mmol/L (ref 135–145)
TOTAL PROTEIN: 7.5 g/dL (ref 6.5–8.1)

## 2015-12-23 LAB — URINE MICROSCOPIC-ADD ON

## 2015-12-23 LAB — URINALYSIS, ROUTINE W REFLEX MICROSCOPIC
Bilirubin Urine: NEGATIVE
GLUCOSE, UA: NEGATIVE mg/dL
Ketones, ur: NEGATIVE mg/dL
Leukocytes, UA: NEGATIVE
Nitrite: NEGATIVE
PROTEIN: NEGATIVE mg/dL
Specific Gravity, Urine: 1.036 — ABNORMAL HIGH (ref 1.005–1.030)
pH: 6 (ref 5.0–8.0)

## 2015-12-23 LAB — CBC
HEMATOCRIT: 34.4 % — AB (ref 36.0–46.0)
HEMOGLOBIN: 12.2 g/dL (ref 12.0–15.0)
MCH: 31.9 pg (ref 26.0–34.0)
MCHC: 35.5 g/dL (ref 30.0–36.0)
MCV: 89.8 fL (ref 78.0–100.0)
PLATELETS: 399 10*3/uL (ref 150–400)
RBC: 3.83 MIL/uL — AB (ref 3.87–5.11)
RDW: 14.8 % (ref 11.5–15.5)
WBC: 21.7 10*3/uL — AB (ref 4.0–10.5)

## 2015-12-23 LAB — POC URINE PREG, ED: Preg Test, Ur: NEGATIVE

## 2015-12-23 NOTE — ED Notes (Signed)
Pt. reports low back pain with nausea and emesis onset yesterday , denies diarrhea or fever, pt. added mild dysuria with concentrated urine today .

## 2016-02-27 ENCOUNTER — Encounter (HOSPITAL_BASED_OUTPATIENT_CLINIC_OR_DEPARTMENT_OTHER): Payer: Self-pay | Admitting: Emergency Medicine

## 2016-02-27 ENCOUNTER — Emergency Department (HOSPITAL_BASED_OUTPATIENT_CLINIC_OR_DEPARTMENT_OTHER)
Admission: EM | Admit: 2016-02-27 | Discharge: 2016-02-27 | Disposition: A | Discharge status: Home / Self Care | Payer: Medicare Other | Attending: Dermatology | Admitting: Dermatology

## 2016-02-27 DIAGNOSIS — F329 Major depressive disorder, single episode, unspecified: Secondary | ICD-10-CM | POA: Insufficient documentation

## 2016-02-27 DIAGNOSIS — F17210 Nicotine dependence, cigarettes, uncomplicated: Secondary | ICD-10-CM | POA: Insufficient documentation

## 2016-02-27 DIAGNOSIS — Z5321 Procedure and treatment not carried out due to patient leaving prior to being seen by health care provider: Secondary | ICD-10-CM | POA: Insufficient documentation

## 2016-02-27 DIAGNOSIS — M545 Low back pain: Secondary | ICD-10-CM | POA: Diagnosis present

## 2016-02-27 LAB — URINALYSIS, ROUTINE W REFLEX MICROSCOPIC
BILIRUBIN URINE: NEGATIVE
GLUCOSE, UA: NEGATIVE mg/dL
KETONES UR: NEGATIVE mg/dL
Leukocytes, UA: NEGATIVE
NITRITE: NEGATIVE
PH: 7 (ref 5.0–8.0)
Protein, ur: NEGATIVE mg/dL
Specific Gravity, Urine: 1.026 (ref 1.005–1.030)

## 2016-02-27 LAB — URINE MICROSCOPIC-ADD ON

## 2016-02-27 LAB — PREGNANCY, URINE: Preg Test, Ur: NEGATIVE

## 2016-02-27 NOTE — ED Notes (Signed)
Patient states that she is having diffuse pain to her mid back x 1 days. Reports that she is also having N/V - loose BM's today x 2

## 2016-02-27 NOTE — ED Notes (Signed)
Patient and family member no longer in the waiting room. Patient was visualized leaving

## 2016-03-04 ENCOUNTER — Emergency Department (HOSPITAL_BASED_OUTPATIENT_CLINIC_OR_DEPARTMENT_OTHER)
Admission: EM | Admit: 2016-03-04 | Discharge: 2016-03-04 | Disposition: A | Discharge status: Home / Self Care | Payer: Medicare Other | Attending: Emergency Medicine | Admitting: Emergency Medicine

## 2016-03-04 DIAGNOSIS — F329 Major depressive disorder, single episode, unspecified: Secondary | ICD-10-CM | POA: Diagnosis not present

## 2016-03-04 DIAGNOSIS — R112 Nausea with vomiting, unspecified: Secondary | ICD-10-CM | POA: Diagnosis not present

## 2016-03-04 DIAGNOSIS — M549 Dorsalgia, unspecified: Secondary | ICD-10-CM | POA: Diagnosis not present

## 2016-03-04 DIAGNOSIS — J45909 Unspecified asthma, uncomplicated: Secondary | ICD-10-CM | POA: Diagnosis not present

## 2016-03-04 DIAGNOSIS — F17210 Nicotine dependence, cigarettes, uncomplicated: Secondary | ICD-10-CM | POA: Diagnosis not present

## 2016-03-04 LAB — COMPREHENSIVE METABOLIC PANEL
ALBUMIN: 4 g/dL (ref 3.5–5.0)
ALT: 16 U/L (ref 14–54)
ANION GAP: 7 (ref 5–15)
AST: 17 U/L (ref 15–41)
Alkaline Phosphatase: 70 U/L (ref 38–126)
BILIRUBIN TOTAL: 0.3 mg/dL (ref 0.3–1.2)
BUN: 15 mg/dL (ref 6–20)
CHLORIDE: 108 mmol/L (ref 101–111)
CO2: 24 mmol/L (ref 22–32)
Calcium: 9.1 mg/dL (ref 8.9–10.3)
Creatinine, Ser: 1.07 mg/dL — ABNORMAL HIGH (ref 0.44–1.00)
GFR calc Af Amer: >60 mL/min (ref >60)
GLUCOSE: 79 mg/dL (ref 65–99)
POTASSIUM: 3.5 mmol/L (ref 3.5–5.1)
Sodium: 139 mmol/L (ref 135–145)
TOTAL PROTEIN: 7.5 g/dL (ref 6.5–8.1)

## 2016-03-04 LAB — URINE MICROSCOPIC-ADD ON

## 2016-03-04 LAB — CBC WITH DIFFERENTIAL/PLATELET
BASOS ABS: 0.1 10*3/uL (ref 0.0–0.1)
BASOS PCT: 0 %
EOS PCT: 3 %
Eosinophils Absolute: 0.4 10*3/uL (ref 0.0–0.7)
HEMATOCRIT: 36.5 % (ref 36.0–46.0)
Hemoglobin: 12.4 g/dL (ref 12.0–15.0)
Lymphocytes Relative: 33 %
Lymphs Abs: 4.7 10*3/uL — ABNORMAL HIGH (ref 0.7–4.0)
MCH: 31.2 pg (ref 26.0–34.0)
MCHC: 34 g/dL (ref 30.0–36.0)
MCV: 91.7 fL (ref 78.0–100.0)
MONO ABS: 0.9 10*3/uL (ref 0.1–1.0)
MONOS PCT: 6 %
NEUTROS ABS: 8.1 10*3/uL — AB (ref 1.7–7.7)
Neutrophils Relative %: 58 %
PLATELETS: 304 10*3/uL (ref 150–400)
RBC: 3.98 MIL/uL (ref 3.87–5.11)
RDW: 14.5 % (ref 11.5–15.5)
WBC: 14.1 10*3/uL — ABNORMAL HIGH (ref 4.0–10.5)

## 2016-03-04 LAB — URINALYSIS, ROUTINE W REFLEX MICROSCOPIC
Glucose, UA: NEGATIVE mg/dL
KETONES UR: NEGATIVE mg/dL
LEUKOCYTES UA: NEGATIVE
NITRITE: NEGATIVE
PROTEIN: NEGATIVE mg/dL
Specific Gravity, Urine: 1.041 — ABNORMAL HIGH (ref 1.005–1.030)
pH: 5.5 (ref 5.0–8.0)

## 2016-03-04 LAB — PREGNANCY, URINE: Preg Test, Ur: NEGATIVE

## 2016-03-04 LAB — LIPASE, BLOOD: LIPASE: 20 U/L (ref 11–51)

## 2016-03-04 MED ORDER — ONDANSETRON HCL 4 MG/2ML IJ SOLN
4.0000 mg | Freq: Once | INTRAMUSCULAR | Status: AC
  Administered 2016-03-04: 4 mg via INTRAVENOUS
  Filled 2016-03-04: qty 2

## 2016-03-04 MED ORDER — SODIUM CHLORIDE 0.9 % IV BOLUS (SEPSIS)
1000.0000 mL | Freq: Once | INTRAVENOUS | Status: AC
  Administered 2016-03-04: 1000 mL via INTRAVENOUS

## 2016-03-04 MED ORDER — METOCLOPRAMIDE HCL 10 MG PO TABS: 10.0000 mg | ORAL_TABLET | Freq: Three times a day (TID) | ORAL | 0 refills | Status: DC | PRN

## 2016-03-04 MED ORDER — ACETAMINOPHEN 500 MG PO TABS
1000.0000 mg | ORAL_TABLET | Freq: Once | ORAL | Status: AC
  Administered 2016-03-04: 1000 mg via ORAL
  Filled 2016-03-04: qty 2

## 2016-03-04 NOTE — ED Provider Notes (Addendum)
[facility]   CSN: [id number] Arrival date & time: 03/04/16  0121  First Provider Contact:  None       History   Chief Complaint Chief Complaint  Patient presents with  . Back Pain  . Nausea    HPI Robin Frey is a 40 y.o. female with no significant past medical history presenting today with bilateral flank pain, right worse than left. Patient states this is been going on for 1 week and it radiates to her lower abdomen. She has nausea and vomiting with this. She states she ate something prior to arrival and so far it has stayed down. She denies any diarrhea. She states she's had difficulty urinating and also had dark urine. She has a history of a cholecystectomy. She denies fevers or sick contacts. She has no irregular vaginal bleeding or discharge. There are no further complaints.  10 Systems reviewed and are negative for acute change except as noted in the HPI.    HPI  Past Medical History:  Diagnosis Date  . Asthma   . Bipolar disorder (HCC)   . Depression   . Dysrhythmia    pt states "one MD heard arrhythmia, others have not"  . GERD (gastroesophageal reflux disease)   . Neuropathy (HCC)   . Obesity   . Pneumonia    multiple accounts  . Sleep apnea    nasal cannula at night    There are no active problems to display for this patient.   Past Surgical History:  Procedure Laterality Date  . APPENDECTOMY    . 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 CHOLECYSTECTOMY N/A 11/26/2014   Procedure: LAPAROSCOPIC CHOLECYSTECTOMY WITH INTRAOPERATIVE CHOLANGIOGRAM;  Surgeon: Gaynelle Adu, MD;  Location: [facility];  Service: General;  Laterality: N/A;  . EYE SURGERY     tear duct surgery as child  . TUBAL LIGATION      OB History    No data available       Home Medications    Prior to Admission medications   Medication Sig Start Date End Date Taking? Authorizing Provider  albuterol (PROAIR HFA)  108 (90 BASE) MCG/ACT inhaler Inhale 2 puffs into the lungs every 6 (six) hours as needed for wheezing or shortness of breath.    Historical Provider, MD  diazepam (VALIUM) 5 MG tablet Take 5 mg by mouth every 6 (six) hours as needed for anxiety.    Historical Provider, MD  dicyclomine (BENTYL) 20 MG tablet Take 1 tablet (20 mg total) by mouth every 6 (six) hours as needed (for abdominal cramping). 12/08/14   Marisa Severin, MD  FLUoxetine (PROZAC) 40 MG capsule Take 40 mg by mouth every morning.    Historical Provider, MD  gabapentin (NEURONTIN) 300 MG capsule Take 300 mg by mouth at bedtime.    Historical Provider, MD  HYDROcodone-acetaminophen (NORCO) 5-325 MG per tablet Take 1-2 tablets by mouth every 6 (six) hours as needed for moderate pain. 12/08/14   Marisa Severin, MD  metoCLOPramide (REGLAN) 10 MG tablet Take 1 tablet (10 mg total) by mouth every 6 (six) hours. 12/08/14   Marisa Severin, MD  omeprazole (PRILOSEC) 40 MG capsule Take 40 mg by mouth 2 (two) times daily.     Historical Provider, MD  ondansetron (ZOFRAN ODT) 8 MG disintegrating tablet Take 1 tablet (8 mg total) by mouth every 8 (eight) hours as needed for nausea or  vomiting. 12/08/14   Marisa Severin, MD    Family History Family History  Problem Relation Age of Onset  . Diabetes Other   . Hyperlipidemia Other   . Hypertension Other   . Stroke Other   . Asthma Other   . Cancer Other     Social History Social History  Substance Use Topics  . Smoking status: Heavy Tobacco Smoker    Packs/day: 0.00    Years: 14.00    Types: Cigarettes  . Smokeless tobacco: Not on file  . Alcohol use No     Allergies   Cephalexin; Ciprofloxacin; Morphine and related; Trazodone and nefazodone; and Ketorolac tromethamine   Review of Systems Review of Systems   Physical Exam Updated Vital Signs BP 128/85 (BP Location: Right Arm)   Pulse 68   Temp 98.1 F (36.7 C) (Oral)   Resp 18   LMP 02/12/2016   SpO2 97%   Physical Exam  Constitutional:  She is oriented to person, place, and time. She appears well-developed and well-nourished. No distress.  Obese female  HENT:  Head: Normocephalic and atraumatic.  Nose: Nose normal.  Mouth/Throat: Oropharynx is clear and moist. No oropharyngeal exudate.  Eyes: Conjunctivae and EOM are normal. Pupils are equal, round, and reactive to light. No scleral icterus.  Neck: Normal range of motion. Neck supple. No JVD present. No tracheal deviation present. No thyromegaly present.  Cardiovascular: Normal rate, regular rhythm and normal heart sounds.  Exam reveals no gallop and no friction rub.   No murmur heard. Pulmonary/Chest: Effort normal and breath sounds normal. No respiratory distress. She has no wheezes. She exhibits no tenderness.  Abdominal: Soft. Bowel sounds are normal. She exhibits no distension and no mass. There is no tenderness. There is no rebound and no guarding.  Musculoskeletal: Normal range of motion. She exhibits no edema or tenderness.  Lymphadenopathy:    She has no cervical adenopathy.  Neurological: She is alert and oriented to person, place, and time. No cranial nerve deficit. She exhibits normal muscle tone.  Skin: Skin is warm and dry. No rash noted. No erythema. No pallor.  Nursing note and vitals reviewed.    ED Treatments / Results  Labs (all labs ordered are listed, but only abnormal results are displayed) Labs Reviewed  CBC WITH DIFFERENTIAL/PLATELET - Abnormal; Notable for the following:       Result Value   WBC 14.1 (*)    Neutro Abs 8.1 (*)    Lymphs Abs 4.7 (*)    All other components within normal limits  COMPREHENSIVE METABOLIC PANEL - Abnormal; Notable for the following:    Creatinine, Ser 1.07 (*)    All other components within normal limits  URINALYSIS, ROUTINE W REFLEX MICROSCOPIC (NOT AT [facility]) - Abnormal; Notable for the following:    Color, Urine AMBER (*)    APPearance CLOUDY (*)    Specific Gravity, Urine 1.041 (*)    Hgb urine dipstick  LARGE (*)    Bilirubin Urine SMALL (*)    All other components within normal limits  URINE MICROSCOPIC-ADD ON - Abnormal; Notable for the following:    Squamous Epithelial / LPF 0-5 (*)    Bacteria, UA FEW (*)    All other components within normal limits  LIPASE, BLOOD  PREGNANCY, URINE    EKG  EKG Interpretation None       Radiology No results found.  Procedures Procedures (including critical care time)  Medications Ordered in ED Medications  ondansetron (ZOFRAN)  injection 4 mg (4 mg Intravenous Given 03/04/16 0203)  sodium chloride 0.9 % bolus 1,000 mL (1,000 mLs Intravenous New Bag/Given 03/04/16 0202)  ondansetron (ZOFRAN) injection 4 mg (4 mg Intravenous Given 03/04/16 0235)  acetaminophen (TYLENOL) tablet 1,000 mg (1,000 mg Oral Given 03/04/16 0235)     Initial Impression / Assessment and Plan / ED Course  I have reviewed the triage vital signs and the nursing notes.  Pertinent labs & imaging results that were available during my care of the patient were reviewed by me and considered in my medical decision making (see chart for details).  Clinical Course    Patient presents to the ED for flank pain radiating to her stomach with vomiting.  Will obtain labs for evaluation.  She as given IVF and zofran.  Urine in the room appears dark, will send for UA.  PE is unremarkable and without tenderness, imaging is not warranted.    2:49 AM Patient still nauseous.  Labs are unremarkable.  Given repeat zofran.  Also given tylenol as she is requesting pain medication. Plan to DC with PCP fu.  Reglan Rx given.  Tylenol at home as needed for pain.  VS remain within her normal limits and she is safe for DC.   Final Clinical Impressions(s) / ED Diagnoses   Final diagnoses:  None    New Prescriptions New Prescriptions   No medications on file      Tomasita Crumble, MD 03/04/16 0422

## 2016-03-04 NOTE — ED Triage Notes (Signed)
Pt reports attempted to be seen 6 days ago an [facility] but family matters caused her to leave states has had recurrent back pain with nausea and emesis  That is getting worse. Admits to difficulty urinating but denies blood or discharge.

## 2016-03-04 NOTE — ED Notes (Addendum)
Bolus complete IVF 0.9 NSS 1 liter infused

## 2016-03-04 NOTE — ED Notes (Signed)
Pt states the zofran has not helped her nausea. MD made aware.

## 2017-07-12 ENCOUNTER — Emergency Department (HOSPITAL_BASED_OUTPATIENT_CLINIC_OR_DEPARTMENT_OTHER)
Admission: EM | Admit: 2017-07-12 | Discharge: 2017-07-12 | Disposition: A | Discharge status: Home / Self Care | Payer: Medicare Other | Attending: Emergency Medicine | Admitting: Emergency Medicine

## 2017-07-12 ENCOUNTER — Encounter (HOSPITAL_BASED_OUTPATIENT_CLINIC_OR_DEPARTMENT_OTHER): Payer: Self-pay | Admitting: Emergency Medicine

## 2017-07-12 ENCOUNTER — Other Ambulatory Visit: Payer: Self-pay

## 2017-07-12 ENCOUNTER — Emergency Department (HOSPITAL_BASED_OUTPATIENT_CLINIC_OR_DEPARTMENT_OTHER): Payer: Medicare Other

## 2017-07-12 DIAGNOSIS — Z79899 Other long term (current) drug therapy: Secondary | ICD-10-CM | POA: Insufficient documentation

## 2017-07-12 DIAGNOSIS — J181 Lobar pneumonia, unspecified organism: Secondary | ICD-10-CM | POA: Diagnosis not present

## 2017-07-12 DIAGNOSIS — J45909 Unspecified asthma, uncomplicated: Secondary | ICD-10-CM | POA: Diagnosis not present

## 2017-07-12 DIAGNOSIS — F17210 Nicotine dependence, cigarettes, uncomplicated: Secondary | ICD-10-CM | POA: Diagnosis not present

## 2017-07-12 DIAGNOSIS — J189 Pneumonia, unspecified organism: Secondary | ICD-10-CM

## 2017-07-12 DIAGNOSIS — R0602 Shortness of breath: Secondary | ICD-10-CM | POA: Diagnosis present

## 2017-07-12 HISTORY — DX: Pure hypercholesterolemia, unspecified: E78.00

## 2017-07-12 MED ORDER — PROMETHAZINE-DM 6.25-15 MG/5ML PO SYRP: 5.0000 mL | ORAL_SOLUTION | Freq: Four times a day (QID) | ORAL | 0 refills | Status: DC | PRN

## 2017-07-12 MED ORDER — AMOXICILLIN-POT CLAVULANATE 875-125 MG PO TABS: 1.0000 | ORAL_TABLET | Freq: Two times a day (BID) | ORAL | 0 refills | Status: DC

## 2017-07-12 MED ORDER — AMOXICILLIN-POT CLAVULANATE 875-125 MG PO TABS
1.0000 | ORAL_TABLET | Freq: Once | ORAL | Status: AC
  Administered 2017-07-12: 1 via ORAL
  Filled 2017-07-12: qty 1

## 2017-07-12 MED ORDER — ALBUTEROL SULFATE HFA 108 (90 BASE) MCG/ACT IN AERS
1.0000 | INHALATION_SPRAY | RESPIRATORY_TRACT | Status: DC
Start: 2017-07-12 — End: 2017-07-12
  Administered 2017-07-12: 2 via RESPIRATORY_TRACT
  Filled 2017-07-12: qty 6.7

## 2017-07-12 MED ORDER — ALBUTEROL SULFATE HFA 108 (90 BASE) MCG/ACT IN AERS: 1.0000 | INHALATION_SPRAY | Freq: Four times a day (QID) | RESPIRATORY_TRACT | 0 refills | Status: AC | PRN

## 2017-07-12 NOTE — ED Triage Notes (Addendum)
SOB with cough x 3 days, hx of asthma. Also reports chest pain

## 2017-07-12 NOTE — ED Provider Notes (Signed)
[facility] EMERGENCY DEPARTMENT Provider Note   CSN: [id number] Arrival date & time: 07/12/17  1623     History   Chief Complaint Chief Complaint  Patient presents with  . Shortness of Breath    HPI Robin Frey is a 41 y.o. female. Chief complaint is cough, shortness of breath.  HPI:  41 erythema. History of asthma. Is been feeling some shortness of breath with cough and fatigue the last 4-5 days. Sweats at night. Uncertain if she's had fever at home. No chills/rigors. Has used her albuterol only a few times and she states this feels "different". No leg pain or swelling. No history of DVT or PE. History of heart disease. Is a smoker less than a pack per day.  Past Medical History:  Diagnosis Date  . Asthma   . Bipolar disorder (HCC)   . Depression   . Dysrhythmia    pt states "one MD heard arrhythmia, others have not"  . GERD (gastroesophageal reflux disease)   . High cholesterol   . Neuropathy   . Obesity   . Pneumonia    multiple accounts  . Sleep apnea    nasal cannula at night    There are no active problems to display for this patient.   Past Surgical History:  Procedure Laterality Date  . APPENDECTOMY    . 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 CHOLECYSTECTOMY N/A 11/26/2014   Procedure: LAPAROSCOPIC CHOLECYSTECTOMY WITH INTRAOPERATIVE CHOLANGIOGRAM;  Surgeon: Gaynelle AduEric Wilson, MD;  Location: [facility];  Service: General;  Laterality: N/A;  . EYE SURGERY     tear duct surgery as child  . TUBAL LIGATION      OB History    No data available       Home Medications    Prior to Admission medications   Medication Sig Start Date End Date Taking? Authorizing Provider  albuterol (PROVENTIL HFA;VENTOLIN HFA) 108 (90 Base) MCG/ACT inhaler Inhale 1-2 puffs into the lungs every 6 (six) hours as needed for wheezing. 07/12/17   [name], Acelyn Basham, MD  amoxicillin-clavulanate (AUGMENTIN) 875-125 MG tablet Take 1 tablet by  mouth 2 (two) times daily. 07/12/17   [name], Ellysia Char, MD  diazepam (VALIUM) 5 MG tablet Take 5 mg by mouth every 6 (six) hours as needed for anxiety.    [provider]  dicyclomine (BENTYL) 20 MG tablet Take 1 tablet (20 mg total) by mouth every 6 (six) hours as needed (for abdominal cramping). 12/08/14   Marisa Severintter, Olga, MD  FLUoxetine (PROZAC) 40 MG capsule Take 40 mg by mouth every morning.    [provider]  gabapentin (NEURONTIN) 300 MG capsule Take 300 mg by mouth at bedtime.    [provider]  HYDROcodone-acetaminophen (NORCO) 5-325 MG per tablet Take 1-2 tablets by mouth every 6 (six) hours as needed for moderate pain. 12/08/14   Marisa Severintter, Olga, MD  metoCLOPramide (REGLAN) 10 MG tablet Take 1 tablet (10 mg total) by mouth every 8 (eight) hours as needed for nausea or vomiting. 03/04/16   Tomasita Crumbleni, Adeleke, MD  omeprazole (PRILOSEC) 40 MG capsule Take 40 mg by mouth 2 (two) times daily.     [provider]  ondansetron (ZOFRAN ODT) 8 MG disintegrating tablet Take 1 tablet (8 mg total) by mouth every 8 (eight) hours as needed for nausea or vomiting. 12/08/14   Marisa Severintter, Olga, MD  promethazine-dextromethorphan (PROMETHAZINE-DM) 6.25-15 MG/5ML syrup Take 5 mLs  by mouth 4 (four) times daily as needed for cough. 07/12/17   [name], Acsa Estey, MD    Family History Family History  Problem Relation Age of Onset  . Diabetes Other   . Hyperlipidemia Other   . Hypertension Other   . Stroke Other   . Asthma Other   . Cancer Other     Social History Social History   Tobacco Use  . Smoking status: Heavy Tobacco Smoker    Packs/day: 0.00    Years: 14.00    Pack years: 0.00    Types: Cigarettes  Substance Use Topics  . Alcohol use: No  . Drug use: No     Allergies   Cephalexin; Ciprofloxacin; Morphine and related; Trazodone and nefazodone; and Ketorolac tromethamine   Review of Systems Review of Systems  Constitutional: Positive for fatigue. Negative for appetite change,  chills, diaphoresis and fever.  HENT: Negative for mouth sores, sore throat and trouble swallowing.   Eyes: Negative for visual disturbance.  Respiratory: Positive for cough and shortness of breath. Negative for chest tightness and wheezing.   Cardiovascular: Negative for chest pain.  Gastrointestinal: Negative for abdominal distention, abdominal pain, diarrhea, nausea and vomiting.  Endocrine: Negative for polydipsia, polyphagia and polyuria.  Genitourinary: Negative for dysuria, frequency and hematuria.  Musculoskeletal: Negative for gait problem.  Skin: Negative for color change, pallor and rash.  Neurological: Negative for dizziness, syncope, light-headedness and headaches.  Hematological: Does not bruise/bleed easily.  Psychiatric/Behavioral: Negative for behavioral problems and confusion.     Physical Exam Updated Vital Signs BP (!) 137/93 (BP Location: Left Arm)   Pulse 82   Temp 98.4 F (36.9 C) (Oral)   Resp 20   Ht 5\' 7"  (1.702 m)   Wt 127 kg (280 lb)   LMP 06/05/2017   SpO2 100%   BMI 43.85 kg/m   Physical Exam  Constitutional: She is oriented to person, place, and time. She appears well-developed and well-nourished. No distress.  HENT:  Head: Normocephalic.  Eyes: Conjunctivae are normal. Pupils are equal, round, and reactive to light. No scleral icterus.  Neck: Normal range of motion. Neck supple. No thyromegaly present.  Cardiovascular: Normal rate and regular rhythm. Exam reveals no gallop and no friction rub.  No murmur heard. Pulmonary/Chest: Effort normal and breath sounds normal. No respiratory distress. She has no wheezes. She has no rales.  Abdominal: Soft. Bowel sounds are normal. She exhibits no distension. There is no tenderness. There is no rebound.  Musculoskeletal: Normal range of motion.  Neurological: She is alert and oriented to person, place, and time.  Skin: Skin is warm and dry. No rash noted.  Psychiatric: She has a normal mood and affect.  Her behavior is normal.     ED Treatments / Results  Labs (all labs ordered are listed, but only abnormal results are displayed) Labs Reviewed - No data to display  EKG  EKG Interpretation None       Radiology Dg Chest 2 View  Result Date: 07/12/2017 CLINICAL DATA:  Stringy of asthma -bronchitis, pneumonia. Patient is current smoker. She is had fatigue, cough, and shortness of breath for the past 3 days with onset of left-sided chest pain today. EXAM: CHEST  2 VIEW COMPARISON:  Chest x-ray of Dec 21, 2016 FINDINGS: The lungs are adequately inflated. The interstitial markings are increased. There is is subtle increased airspace opacity in the right mid lung. There is no pleural effusion. The heart and pulmonary vascularity are normal. The bony  thorax exhibits no acute abnormality. IMPRESSION: Chronic asthmatic -bronchitic-smoking related changes. Subtle increased density in the right mid lung may reflect early pneumonia. Followup PA and lateral chest X-ray is recommended in 3-4 weeks following trial of antibiotic therapy to ensure resolution and exclude underlying malignancy. Electronically Signed   By: David  [place] M.D.   On: 07/12/2017 16:51    Procedures Procedures (including critical care time)  Medications Ordered in ED Medications  amoxicillin-clavulanate (AUGMENTIN) 875-125 MG per tablet 1 tablet (not administered)  albuterol (PROVENTIL HFA;VENTOLIN HFA) 108 (90 Base) MCG/ACT inhaler 1-2 puff (not administered)     Initial Impression / Assessment and Plan / ED Course  I have reviewed the triage vital signs and the nursing notes.  Pertinent labs & imaging results that were available during my care of the patient were reviewed by me and considered in my medical decision making (see chart for details).    Clear lungs. No increased work of breathing. Has subtleties on her x-ray consistent with right lower lobe pneumonia. Has tolerated penicillins. We'll use Augmentin.  Discharge home. Refill on her albuterol. Promethazine DM for cough.  Final Clinical Impressions(s) / ED Diagnoses   Final diagnoses:  Community acquired pneumonia of right lower lobe of lung [facility])    ED Discharge Orders        Ordered    amoxicillin-clavulanate (AUGMENTIN) 875-125 MG tablet  2 times daily     07/12/17 1755    albuterol (PROVENTIL HFA;VENTOLIN HFA) 108 (90 Base) MCG/ACT inhaler  Every 6 hours PRN     07/12/17 1755    promethazine-dextromethorphan (PROMETHAZINE-DM) 6.25-15 MG/5ML syrup  4 times daily PRN     07/12/17 1755       Rolland Porter, MD 07/12/17 1757

## 2017-09-25 ENCOUNTER — Encounter (HOSPITAL_BASED_OUTPATIENT_CLINIC_OR_DEPARTMENT_OTHER): Payer: Self-pay

## 2017-09-25 ENCOUNTER — Other Ambulatory Visit: Payer: Self-pay

## 2017-09-25 ENCOUNTER — Emergency Department (HOSPITAL_BASED_OUTPATIENT_CLINIC_OR_DEPARTMENT_OTHER)
Admission: EM | Admit: 2017-09-25 | Discharge: 2017-09-25 | Disposition: A | Discharge status: Home / Self Care | Payer: Medicare Other | Attending: Emergency Medicine | Admitting: Emergency Medicine

## 2017-09-25 DIAGNOSIS — Z5321 Procedure and treatment not carried out due to patient leaving prior to being seen by health care provider: Secondary | ICD-10-CM | POA: Diagnosis not present

## 2017-09-25 DIAGNOSIS — R2241 Localized swelling, mass and lump, right lower limb: Secondary | ICD-10-CM | POA: Diagnosis not present

## 2017-09-25 NOTE — ED Notes (Signed)
Pt called,no answer.

## 2017-09-25 NOTE — ED Triage Notes (Signed)
C/o swelling to right leg x 6-7 months-also c/o feeling light headed x 1+ week-[facility] and taking abx-pt NAD-steady gait

## 2017-09-25 NOTE — ED Notes (Signed)
Patient called X2 with no answer 

## 2017-09-26 ENCOUNTER — Emergency Department (HOSPITAL_BASED_OUTPATIENT_CLINIC_OR_DEPARTMENT_OTHER)
Admission: EM | Admit: 2017-09-26 | Discharge: 2017-09-27 | Disposition: A | Discharge status: Home / Self Care | Payer: Medicare Other | Attending: Emergency Medicine | Admitting: Emergency Medicine

## 2017-09-26 ENCOUNTER — Encounter (HOSPITAL_BASED_OUTPATIENT_CLINIC_OR_DEPARTMENT_OTHER): Payer: Self-pay

## 2017-09-26 ENCOUNTER — Other Ambulatory Visit: Payer: Self-pay

## 2017-09-26 ENCOUNTER — Emergency Department (HOSPITAL_BASED_OUTPATIENT_CLINIC_OR_DEPARTMENT_OTHER): Payer: Medicare Other

## 2017-09-26 DIAGNOSIS — J45909 Unspecified asthma, uncomplicated: Secondary | ICD-10-CM | POA: Insufficient documentation

## 2017-09-26 DIAGNOSIS — J189 Pneumonia, unspecified organism: Secondary | ICD-10-CM | POA: Insufficient documentation

## 2017-09-26 DIAGNOSIS — R52 Pain, unspecified: Secondary | ICD-10-CM

## 2017-09-26 DIAGNOSIS — F17210 Nicotine dependence, cigarettes, uncomplicated: Secondary | ICD-10-CM | POA: Diagnosis not present

## 2017-09-26 DIAGNOSIS — M79604 Pain in right leg: Secondary | ICD-10-CM | POA: Diagnosis not present

## 2017-09-26 DIAGNOSIS — R109 Unspecified abdominal pain: Secondary | ICD-10-CM | POA: Insufficient documentation

## 2017-09-26 DIAGNOSIS — R05 Cough: Secondary | ICD-10-CM | POA: Diagnosis present

## 2017-09-26 LAB — CBC WITH DIFFERENTIAL/PLATELET
Basophils Absolute: 0.1 10*3/uL (ref 0.0–0.1)
Basophils Relative: 1 %
EOS ABS: 0.3 10*3/uL (ref 0.0–0.7)
Eosinophils Relative: 2 %
HEMATOCRIT: 38.5 % (ref 36.0–46.0)
Hemoglobin: 13.2 g/dL (ref 12.0–15.0)
LYMPHS ABS: 4.8 10*3/uL — AB (ref 0.7–4.0)
Lymphocytes Relative: 28 %
MCH: 32.1 pg (ref 26.0–34.0)
MCHC: 34.3 g/dL (ref 30.0–36.0)
MCV: 93.7 fL (ref 78.0–100.0)
MONO ABS: 1 10*3/uL (ref 0.1–1.0)
MONOS PCT: 6 %
NEUTROS PCT: 63 %
Neutro Abs: 11 10*3/uL — ABNORMAL HIGH (ref 1.7–7.7)
Platelets: 390 10*3/uL (ref 150–400)
RBC: 4.11 MIL/uL (ref 3.87–5.11)
RDW: 14.7 % (ref 11.5–15.5)
WBC: 17.2 10*3/uL — ABNORMAL HIGH (ref 4.0–10.5)

## 2017-09-26 LAB — BASIC METABOLIC PANEL
Anion gap: 8 (ref 5–15)
BUN: 8 mg/dL (ref 6–20)
CHLORIDE: 105 mmol/L (ref 101–111)
CO2: 23 mmol/L (ref 22–32)
CREATININE: 0.97 mg/dL (ref 0.44–1.00)
Calcium: 8.6 mg/dL — ABNORMAL LOW (ref 8.9–10.3)
GFR calc Af Amer: >60 mL/min (ref >60)
GFR calc non Af Amer: >60 mL/min (ref >60)
Glucose, Bld: 80 mg/dL (ref 65–99)
Potassium: 3.8 mmol/L (ref 3.5–5.1)
SODIUM: 136 mmol/L (ref 135–145)

## 2017-09-26 LAB — HEPATIC FUNCTION PANEL
ALT: 13 U/L — AB (ref 14–54)
AST: 18 U/L (ref 15–41)
Albumin: 3.6 g/dL (ref 3.5–5.0)
Alkaline Phosphatase: 73 U/L (ref 38–126)
BILIRUBIN TOTAL: 0.2 mg/dL — AB (ref 0.3–1.2)
Bilirubin, Direct: 0.1 mg/dL (ref 0.1–0.5)
Indirect Bilirubin: 0.1 mg/dL — ABNORMAL LOW (ref 0.3–0.9)
Total Protein: 7.4 g/dL (ref 6.5–8.1)

## 2017-09-26 LAB — URINALYSIS, ROUTINE W REFLEX MICROSCOPIC
BILIRUBIN URINE: NEGATIVE
Glucose, UA: NEGATIVE mg/dL
Ketones, ur: NEGATIVE mg/dL
LEUKOCYTES UA: NEGATIVE
NITRITE: NEGATIVE
PH: 6 (ref 5.0–8.0)
Protein, ur: NEGATIVE mg/dL

## 2017-09-26 LAB — PREGNANCY, URINE: PREG TEST UR: NEGATIVE

## 2017-09-26 LAB — URINALYSIS, MICROSCOPIC (REFLEX): WBC UA: NONE SEEN WBC/hpf (ref 0–5)

## 2017-09-26 LAB — D-DIMER, QUANTITATIVE: D-Dimer, Quant: 0.6 ug/mL-FEU — ABNORMAL HIGH (ref 0.00–0.50)

## 2017-09-26 LAB — BRAIN NATRIURETIC PEPTIDE: B Natriuretic Peptide: 29 pg/mL (ref 0.0–100.0)

## 2017-09-26 LAB — LIPASE, BLOOD: Lipase: 21 U/L (ref 11–51)

## 2017-09-26 NOTE — ED Notes (Signed)
ED Provider at bedside. 

## 2017-09-26 NOTE — ED Triage Notes (Signed)
C/o lower abd pain and pain to bilat legs x 1 week-NAD-stead gait-states she is on day 9/10 of abx for PNE-pt NAD-steady gait

## 2017-09-26 NOTE — ED Provider Notes (Addendum)
[facility] EMERGENCY DEPARTMENT Provider Note   CSN: [id number] Arrival date & time: 09/26/17  2055     History   Chief Complaint Chief Complaint  Patient presents with  . Abdominal Pain    HPI [name] is a 42 y.o. female.  Patient presents to the emergency department for evaluation of multiple problems.  Patient reports that she was recently diagnosed with pneumonia, is on day 9 of antibiotics.  She is still experiencing cough and some shortness of breath.  Patient also reports that both of her legs have been painful and swollen.  This is been going on for an unknown amount of time.  She is experiencing abdominal pain.  Pain is diffuse and crampy, no vomiting or diarrhea.      Past Medical History:  Diagnosis Date  . Asthma   . Bipolar disorder (HCC)   . Depression   . Dysrhythmia    pt states "one MD heard arrhythmia, others have not"  . GERD (gastroesophageal reflux disease)   . High cholesterol   . Neuropathy   . Obesity   . Pneumonia    multiple accounts  . Sleep apnea    nasal cannula at night    There are no active problems to display for this patient.   Past Surgical History:  Procedure Laterality Date  . APPENDECTOMY    . 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 CHOLECYSTECTOMY N/A 11/26/2014   Procedure: LAPAROSCOPIC CHOLECYSTECTOMY WITH INTRAOPERATIVE CHOLANGIOGRAM;  Surgeon: Gaynelle AduEric Wilson, MD;  Location: [facility];  Service: General;  Laterality: N/A;  . EYE SURGERY     tear duct surgery as child  . TUBAL LIGATION      OB History    No data available       Home Medications    Prior to Admission medications   Medication Sig Start Date End Date Taking? Authorizing Provider  gabapentin (NEURONTIN) 600 MG tablet Take 600 mg by mouth 3 (three) times daily.   Yes [provider]  albuterol (PROVENTIL HFA;VENTOLIN HFA) 108 (90 Base) MCG/ACT inhaler Inhale 1-2 puffs into the lungs  every 6 (six) hours as needed for wheezing. 07/12/17   [name], Mark, MD  Fluticasone Furoate-Vilanterol (BREO ELLIPTA IN) Inhale into the lungs.    [provider]  ibuprofen (ADVIL,MOTRIN) 800 MG tablet Take 1 tablet (800 mg total) by mouth 3 (three) times daily. 09/27/17   [name], Timothea Bodenheimer J, MD  traMADol (ULTRAM) 50 MG tablet Take 1 tablet (50 mg total) by mouth every 6 (six) hours as needed. 09/27/17   [name], [name] J, MD    Family History Family History  Problem Relation Age of Onset  . Diabetes Other   . Hyperlipidemia Other   . Hypertension Other   . Stroke Other   . Asthma Other   . Cancer Other     Social History Social History   Tobacco Use  . Smoking status: Current Every Day Smoker    Packs/day: 0.00    Years: 14.00    Pack years: 0.00    Types: Cigarettes  . Smokeless tobacco: Never Used  Substance Use Topics  . Alcohol use: No  . Drug use: No     Allergies   Cephalexin; Ciprofloxacin; Morphine and related; Trazodone and nefazodone; and Ketorolac tromethamine   Review of Systems Review of Systems  Constitutional: Negative for fever.  Respiratory: Positive for cough.  Cardiovascular: Positive for leg swelling.  Gastrointestinal: Positive for abdominal pain.  All other systems reviewed and are negative.    Physical Exam Updated Vital Signs BP 112/63 (BP Location: Right Arm)   Pulse 84   Temp 99 F (37.2 C) (Oral)   Resp 19   Ht 5\' 7"  (1.702 m)   Wt 123.7 kg (272 lb 11.3 oz)   LMP 09/14/2017   SpO2 98%   BMI 42.71 kg/m   Physical Exam  Constitutional: She is oriented to person, place, and time. She appears well-developed and well-nourished. No distress.  HENT:  Head: Normocephalic and atraumatic.  Right Ear: Hearing normal.  Left Ear: Hearing normal.  Nose: Nose normal.  Mouth/Throat: Oropharynx is clear and moist and mucous membranes are normal.  Eyes: Conjunctivae and EOM are normal. Pupils are equal, round, and  reactive to light.  Neck: Normal range of motion. Neck supple.  Cardiovascular: Regular rhythm, S1 normal and S2 normal. Exam reveals no gallop and no friction rub.  No murmur heard. Pulmonary/Chest: Effort normal and breath sounds normal. No respiratory distress. She exhibits no tenderness.  Abdominal: Soft. Normal appearance and bowel sounds are normal. There is no hepatosplenomegaly. There is generalized tenderness (mild). There is no rebound, no guarding, no tenderness at McBurney's point and negative Murphy's sign. No hernia.  Musculoskeletal: Normal range of motion.  Neurological: She is alert and oriented to person, place, and time. She has normal strength. No cranial nerve deficit or sensory deficit. Coordination normal. GCS eye subscore is 4. GCS verbal subscore is 5. GCS motor subscore is 6.  Skin: Skin is warm, dry and intact. No rash noted. No cyanosis.  Psychiatric: She has a normal mood and affect. Her speech is normal and behavior is normal. Thought content normal.  Nursing note and vitals reviewed.    ED Treatments / Results  Labs (all labs ordered are listed, but only abnormal results are displayed) Labs Reviewed  URINALYSIS, ROUTINE W REFLEX MICROSCOPIC - Abnormal; Notable for the following components:      Result Value   Specific Gravity, Urine <1.005 (*)    Hgb urine dipstick MODERATE (*)    All other components within normal limits  URINALYSIS, MICROSCOPIC (REFLEX) - Abnormal; Notable for the following components:   Bacteria, UA RARE (*)    Squamous Epithelial / LPF 0-5 (*)    All other components within normal limits  CBC WITH DIFFERENTIAL/PLATELET - Abnormal; Notable for the following components:   WBC 17.2 (*)    Neutro Abs 11.0 (*)    Lymphs Abs 4.8 (*)    All other components within normal limits  BASIC METABOLIC PANEL - Abnormal; Notable for the following components:   Calcium 8.6 (*)    All other components within normal limits  D-DIMER, QUANTITATIVE  (NOT AT [facility]) - Abnormal; Notable for the following components:   D-Dimer, Quant 0.60 (*)    All other components within normal limits  HEPATIC FUNCTION PANEL - Abnormal; Notable for the following components:   ALT 13 (*)    Total Bilirubin 0.2 (*)    Indirect Bilirubin 0.1 (*)    All other components within normal limits  PREGNANCY, URINE  BRAIN NATRIURETIC PEPTIDE  LIPASE, BLOOD    EKG  EKG Interpretation None       Radiology Dg Chest 2 View  Result Date: 09/26/2017 CLINICAL DATA:  Cough with recent pneumonia EXAM: CHEST  2 VIEW COMPARISON:  09/17/2017, 07/12/2017 FINDINGS: Clearing of previously noted right  basilar infiltrate. Mild chronic bronchitic changes. No interval consolidation or effusion. Normal cardiomediastinal silhouette. No pneumothorax. Surgical clips in the upper abdomen IMPRESSION: 1. Decreased right middle lobe and basilar infiltrate 2. Mild chronic bronchitic changes 3. No new focal pulmonary infiltrate is seen Electronically Signed   By: Jasmine Pang M.D.   On: 09/26/2017 23:52    Procedures Procedures (including critical care time)  Medications Ordered in ED Medications  acetaminophen (TYLENOL) tablet 1,000 mg (1,000 mg Oral Given 09/27/17 0109)  ibuprofen (ADVIL,MOTRIN) tablet 800 mg (800 mg Oral Given 09/27/17 0109)     Initial Impression / Assessment and Plan / ED Course  I have reviewed the triage vital signs and the nursing notes.  Pertinent labs & imaging results that were available during my care of the patient were reviewed by me and considered in my medical decision making (see chart for details).     Presents with a multitude of complaints.  Reviewing her records reveals that many of these complaints have been somewhat chronic in nature including her leg pain and her abdominal pain.  Patient was, however, recently diagnosed with pneumonia.  Her chest x-ray today shows improving pneumonia.  She is not in any respiratory distress, is afebrile,  has normal oxygenation.  She does not require any change in therapy for her pneumonia.  Patient complaining of abdominal pain.  She tells me that the pain is positional, mostly because she is walking differently because her leg hurts.  When she is sitting down or not walking she does not have abdominal pain.  Her abdominal exam is benign currently.  Reviewing her records reveals many visits for similar abdominal pain as well, multiple CAT scans without any findings.  I do not believe she requires any imaging today.  Her white blood cell count is elevated today, but she also has some chronic elevation of her white cells.  This is nonspecific, she does not have any signs of active infection at this time.  This includes the leg exam.  No cellulitis or concern for infection.  She has bounding dorsalis pedal pulses, no evidence for ischemia.  She tells me her leg is swollen but it is difficult to tell secondary to her body habitus.  She did have a mildly elevated d-dimer.  Will get a dose of Lovenox and come back tomorrow for ultrasound.  As she is not expressing any shortness of breath, tachypnea, tachycardia, I do not feel that her mildly elevated d-dimer represents PE.  Suspect that her ultrasound will be negative as well.  Final Clinical Impressions(s) / ED Diagnoses   Final diagnoses:  Leg pain, diffuse, right    ED Discharge Orders        Ordered    ibuprofen (ADVIL,MOTRIN) 800 MG tablet  3 times daily     09/27/17 0134    traMADol (ULTRAM) 50 MG tablet  Every 6 hours PRN     09/27/17 0134       Gilda Crease, MD 09/27/17 1610    Gilda Crease, MD 10/08/17 [phone]

## 2017-09-26 NOTE — ED Notes (Signed)
Patient transported to X-ray 

## 2017-09-26 NOTE — ED Notes (Signed)
Pt able to ambulate to room with steady gait

## 2017-09-27 ENCOUNTER — Inpatient Hospital Stay (HOSPITAL_BASED_OUTPATIENT_CLINIC_OR_DEPARTMENT_OTHER): Admit: 2017-09-27 | Payer: Medicare Other

## 2017-09-27 DIAGNOSIS — J189 Pneumonia, unspecified organism: Secondary | ICD-10-CM | POA: Diagnosis not present

## 2017-09-27 MED ORDER — TRAMADOL HCL 50 MG PO TABS: 50.0000 mg | ORAL_TABLET | Freq: Four times a day (QID) | ORAL | 0 refills | Status: DC | PRN

## 2017-09-27 MED ORDER — IBUPROFEN 800 MG PO TABS
800.0000 mg | ORAL_TABLET | Freq: Once | ORAL | Status: AC
  Administered 2017-09-27: 800 mg via ORAL
  Filled 2017-09-27: qty 1

## 2017-09-27 MED ORDER — IBUPROFEN 800 MG PO TABS: 800.0000 mg | ORAL_TABLET | Freq: Three times a day (TID) | ORAL | 0 refills | Status: DC

## 2017-09-27 MED ORDER — ACETAMINOPHEN 500 MG PO TABS
1000.0000 mg | ORAL_TABLET | Freq: Once | ORAL | Status: AC
  Administered 2017-09-27: 1000 mg via ORAL
  Filled 2017-09-27: qty 2

## 2017-09-27 NOTE — ED Notes (Signed)
ED Provider at bedside. 

## 2018-03-17 ENCOUNTER — Encounter (HOSPITAL_BASED_OUTPATIENT_CLINIC_OR_DEPARTMENT_OTHER): Payer: Self-pay | Admitting: *Deleted

## 2018-03-17 ENCOUNTER — Emergency Department (HOSPITAL_BASED_OUTPATIENT_CLINIC_OR_DEPARTMENT_OTHER): Payer: Medicare Other

## 2018-03-17 ENCOUNTER — Emergency Department (HOSPITAL_BASED_OUTPATIENT_CLINIC_OR_DEPARTMENT_OTHER)
Admission: EM | Admit: 2018-03-17 | Discharge: 2018-03-18 | Disposition: A | Discharge status: Home / Self Care | Payer: Medicare Other | Attending: Emergency Medicine | Admitting: Emergency Medicine

## 2018-03-17 ENCOUNTER — Other Ambulatory Visit: Payer: Self-pay

## 2018-03-17 DIAGNOSIS — J209 Acute bronchitis, unspecified: Secondary | ICD-10-CM | POA: Insufficient documentation

## 2018-03-17 DIAGNOSIS — R112 Nausea with vomiting, unspecified: Secondary | ICD-10-CM | POA: Diagnosis not present

## 2018-03-17 DIAGNOSIS — F17210 Nicotine dependence, cigarettes, uncomplicated: Secondary | ICD-10-CM | POA: Insufficient documentation

## 2018-03-17 DIAGNOSIS — Z79899 Other long term (current) drug therapy: Secondary | ICD-10-CM | POA: Diagnosis not present

## 2018-03-17 DIAGNOSIS — J45909 Unspecified asthma, uncomplicated: Secondary | ICD-10-CM | POA: Diagnosis not present

## 2018-03-17 DIAGNOSIS — R05 Cough: Secondary | ICD-10-CM | POA: Diagnosis present

## 2018-03-17 LAB — URINALYSIS, MICROSCOPIC (REFLEX)

## 2018-03-17 LAB — URINALYSIS, ROUTINE W REFLEX MICROSCOPIC
Bilirubin Urine: NEGATIVE
Glucose, UA: NEGATIVE mg/dL
Ketones, ur: NEGATIVE mg/dL
Leukocytes, UA: NEGATIVE
Nitrite: NEGATIVE
Protein, ur: NEGATIVE mg/dL
Specific Gravity, Urine: 1.02 (ref 1.005–1.030)
pH: 6.5 (ref 5.0–8.0)

## 2018-03-17 LAB — PREGNANCY, URINE: Preg Test, Ur: NEGATIVE

## 2018-03-17 MED ORDER — ALBUTEROL SULFATE (2.5 MG/3ML) 0.083% IN NEBU
2.5000 mg | INHALATION_SOLUTION | Freq: Once | RESPIRATORY_TRACT | Status: AC
  Administered 2018-03-17: 2.5 mg via RESPIRATORY_TRACT
  Filled 2018-03-17: qty 3

## 2018-03-17 MED ORDER — ONDANSETRON 8 MG PO TBDP
8.0000 mg | ORAL_TABLET | Freq: Once | ORAL | Status: AC
  Administered 2018-03-18: 8 mg via ORAL
  Filled 2018-03-17: qty 1

## 2018-03-17 MED ORDER — DEXAMETHASONE SODIUM PHOSPHATE 10 MG/ML IJ SOLN
10.0000 mg | Freq: Once | INTRAMUSCULAR | Status: AC
  Administered 2018-03-18: 10 mg via INTRAMUSCULAR
  Filled 2018-03-17: qty 1

## 2018-03-17 MED ORDER — IPRATROPIUM-ALBUTEROL 0.5-2.5 (3) MG/3ML IN SOLN
3.0000 mL | Freq: Four times a day (QID) | RESPIRATORY_TRACT | Status: DC
  Administered 2018-03-17: 3 mL via RESPIRATORY_TRACT
  Filled 2018-03-17: qty 3

## 2018-03-17 NOTE — ED Triage Notes (Signed)
Pt reports cough, vomiting x 3 days.

## 2018-03-17 NOTE — ED Provider Notes (Signed)
[facility] Provider Note: Robin Frey Robin Spiewak, MD, FACEP  CSN: [id number] MRN: [id number] ARRIVAL: 03/17/18 at 2144 ROOM: MH04/[facility]04   CHIEF COMPLAINT  Cough   HISTORY OF PRESENT ILLNESS  03/17/18 11:30 PM Robin Frey is a 42 y.o. female with a 3 to 4-day history of occasional vomiting after eating along with general malaise.  She denies associated abdominal pain or diarrhea.  She is here now with shortness of breath and wheezing since this morning.  She has used her home inhaler without adequate relief.  She has some chest soreness which she attributes to coughing.  She has had a subjective fever.  Shortness of breath is worse with exertion.    Past Medical History:  Diagnosis Date  . Asthma   . Bipolar disorder (HCC)   . Depression   . Dysrhythmia    pt states "one MD heard arrhythmia, others have not"  . GERD (gastroesophageal reflux disease)   . High cholesterol   . Neuropathy   . Obesity   . Pneumonia    multiple accounts  . Sleep apnea    nasal cannula at night    Past Surgical History:  Procedure Laterality Date  . APPENDECTOMY    . 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 CHOLECYSTECTOMY N/A 11/26/2014   Procedure: LAPAROSCOPIC CHOLECYSTECTOMY WITH INTRAOPERATIVE CHOLANGIOGRAM;  Surgeon: Gaynelle AduEric Wilson, MD;  Location: [facility];  Service: General;  Laterality: N/A;  . EYE SURGERY     tear duct surgery as child  . TUBAL LIGATION      Family History  Problem Relation Age of Onset  . Diabetes Other   . Hyperlipidemia Other   . Hypertension Other   . Stroke Other   . Asthma Other   . Cancer Other     Social History   Tobacco Use  . Smoking status: Current Every Day Smoker    Packs/day: 0.00    Years: 14.00    Pack years: 0.00    Types: Cigarettes  . Smokeless tobacco: Never Used  Substance Use Topics  . Alcohol use: No  . Drug use: No    Prior to Admission medications   Medication Sig Start Date  End Date Taking? Authorizing Provider  albuterol (PROVENTIL HFA;VENTOLIN HFA) 108 (90 Base) MCG/ACT inhaler Inhale 1-2 puffs into the lungs every 6 (six) hours as needed for wheezing. 07/12/17   [name], Mark, MD  Fluticasone Furoate-Vilanterol (BREO ELLIPTA IN) Inhale into the lungs.    [provider]  gabapentin (NEURONTIN) 600 MG tablet Take 600 mg by mouth 3 (three) times daily.    [provider]  ibuprofen (ADVIL,MOTRIN) 800 MG tablet Take 1 tablet (800 mg total) by mouth 3 (three) times daily. 09/27/17   [name], Christopher J, MD  traMADol (ULTRAM) 50 MG tablet Take 1 tablet (50 mg total) by mouth every 6 (six) hours as needed. 09/27/17   [name], Christopher J, MD    Allergies Cephalexin; Ciprofloxacin; Morphine and related; Trazodone and nefazodone; and Ketorolac tromethamine   REVIEW OF SYSTEMS  Negative except as noted here or in the History of Present Illness.   PHYSICAL EXAMINATION  Initial Vital Signs Blood pressure 132/77, pulse 90, temperature 98.6 F (37 C), temperature source Oral, resp. rate 18, height 5\' 7"  (1.702 m), weight 121.1 kg, last menstrual period 02/28/2018, SpO2 98 %.  Examination General: Well-developed, well-nourished female in no acute distress; appearance consistent with  age of record HENT: normocephalic; atraumatic Eyes: pupils equal, round and reactive to light; extraocular muscles intact Neck: supple Heart: regular rate and rhythm Lungs: Inspiratory and expiratory wheezing Abdomen: soft; nondistended; nontender; bowel sounds present Extremities: No deformity; full range of motion; pulses normal Neurologic: Awake, alert and oriented; motor function intact in all extremities and symmetric; no facial droop Skin: Warm and dry Psychiatric: Normal mood and affect   RESULTS  Summary of this visit's results, reviewed by myself:   EKG Interpretation  Date/Time:    Ventricular Rate:    PR Interval:    QRS Duration:   QT  Interval:    QTC Calculation:   R Axis:     Text Interpretation:        Laboratory Studies: Results for orders placed or performed during the hospital encounter of 03/17/18 (from the past 24 hour(s))  Urinalysis, Routine w reflex microscopic     Status: Abnormal   Collection Time: 03/17/18 10:14 PM  Result Value Ref Range   Color, Urine YELLOW YELLOW   APPearance CLEAR CLEAR   Specific Gravity, Urine 1.020 1.005 - 1.030   pH 6.5 5.0 - 8.0   Glucose, UA NEGATIVE NEGATIVE mg/dL   Hgb urine dipstick MODERATE (A) NEGATIVE   Bilirubin Urine NEGATIVE NEGATIVE   Ketones, ur NEGATIVE NEGATIVE mg/dL   Protein, ur NEGATIVE NEGATIVE mg/dL   Nitrite NEGATIVE NEGATIVE   Leukocytes, UA NEGATIVE NEGATIVE  Pregnancy, urine     Status: None   Collection Time: 03/17/18 10:14 PM  Result Value Ref Range   Preg Test, Ur NEGATIVE NEGATIVE  Urinalysis, Microscopic (reflex)     Status: Abnormal   Collection Time: 03/17/18 10:14 PM  Result Value Ref Range   RBC / HPF 6-10 0 - 5 RBC/hpf   WBC, UA 0-5 0 - 5 WBC/hpf   Bacteria, UA FEW (A) NONE SEEN   Squamous Epithelial / LPF 0-5 0 - 5   Mucus PRESENT    Amorphous Crystal PRESENT    Imaging Studies: Dg Chest 2 View  Result Date: 03/17/2018 CLINICAL DATA:  Acute onset of shortness of breath and cough. EXAM: CHEST - 2 VIEW COMPARISON:  Chest radiograph performed 10/10/2017, and CT of the chest performed 10/11/2017 FINDINGS: The lungs are well-aerated and clear. There is no evidence of focal opacification, pleural effusion or pneumothorax. The heart is normal in size; the mediastinal contour is within normal limits. No acute osseous abnormalities are seen. IMPRESSION: No acute cardiopulmonary process seen. Electronically Signed   By: Roanna Raider M.D.   On: 03/17/2018 22:50    ED COURSE and MDM  Nursing notes and initial vitals signs, including pulse oximetry, reviewed.  Vitals:   03/17/18 2153 03/17/18 2154 03/17/18 2353 03/18/18 0014  BP: 132/77    111/77  Pulse: 90   84  Resp: 18   16  Temp: 98.6 F (37 C)     TempSrc: Oral     SpO2: 98%  100% 97%  Weight:  121.1 kg    Height:  5\' 7"  (1.702 m)     1:25 AM Air movement improved, wheezing improved and patient feels better after albuterol and Atrovent neb treatment.  No pneumonia seen on chest x-ray.  Urinalysis is not diagnostic of urinary tract infection. We will treat the patient with a dose of steroids and Zofran for nausea.  PROCEDURES    ED DIAGNOSES     ICD-10-CM   1. Acute bronchitis with bronchospasm J20.9   2.  Nausea and vomiting in adult R11.2        [name], MD 03/18/18 0126

## 2018-03-18 DIAGNOSIS — J209 Acute bronchitis, unspecified: Secondary | ICD-10-CM | POA: Diagnosis not present

## 2018-03-18 MED ORDER — ONDANSETRON 8 MG PO TBDP: 8.0000 mg | ORAL_TABLET | Freq: Three times a day (TID) | ORAL | 0 refills | Status: DC | PRN

## 2018-04-30 ENCOUNTER — Encounter (HOSPITAL_BASED_OUTPATIENT_CLINIC_OR_DEPARTMENT_OTHER): Payer: Self-pay

## 2018-04-30 ENCOUNTER — Emergency Department (HOSPITAL_BASED_OUTPATIENT_CLINIC_OR_DEPARTMENT_OTHER)
Admission: EM | Admit: 2018-04-30 | Discharge: 2018-04-30 | Disposition: A | Discharge status: Home / Self Care | Payer: Medicare Other | Attending: Emergency Medicine | Admitting: Emergency Medicine

## 2018-04-30 ENCOUNTER — Emergency Department (HOSPITAL_BASED_OUTPATIENT_CLINIC_OR_DEPARTMENT_OTHER): Payer: Medicare Other

## 2018-04-30 ENCOUNTER — Other Ambulatory Visit: Payer: Self-pay

## 2018-04-30 DIAGNOSIS — R109 Unspecified abdominal pain: Secondary | ICD-10-CM

## 2018-04-30 DIAGNOSIS — R1032 Left lower quadrant pain: Secondary | ICD-10-CM | POA: Insufficient documentation

## 2018-04-30 DIAGNOSIS — Z9049 Acquired absence of other specified parts of digestive tract: Secondary | ICD-10-CM | POA: Insufficient documentation

## 2018-04-30 DIAGNOSIS — F17210 Nicotine dependence, cigarettes, uncomplicated: Secondary | ICD-10-CM | POA: Insufficient documentation

## 2018-04-30 DIAGNOSIS — Z79899 Other long term (current) drug therapy: Secondary | ICD-10-CM | POA: Insufficient documentation

## 2018-04-30 DIAGNOSIS — F319 Bipolar disorder, unspecified: Secondary | ICD-10-CM | POA: Insufficient documentation

## 2018-04-30 DIAGNOSIS — J45909 Unspecified asthma, uncomplicated: Secondary | ICD-10-CM | POA: Insufficient documentation

## 2018-04-30 LAB — URINALYSIS, MICROSCOPIC (REFLEX)

## 2018-04-30 LAB — CBC WITH DIFFERENTIAL/PLATELET
Basophils Absolute: 0.1 10*3/uL (ref 0.0–0.1)
Basophils Relative: 1 %
EOS ABS: 0.3 10*3/uL (ref 0.0–0.7)
EOS PCT: 2 %
HCT: 40.7 % (ref 36.0–46.0)
Hemoglobin: 14.4 g/dL (ref 12.0–15.0)
LYMPHS ABS: 4.2 10*3/uL — AB (ref 0.7–4.0)
LYMPHS PCT: 28 %
MCH: 33.4 pg (ref 26.0–34.0)
MCHC: 35.4 g/dL (ref 30.0–36.0)
MCV: 94.4 fL (ref 78.0–100.0)
MONO ABS: 0.7 10*3/uL (ref 0.1–1.0)
MONOS PCT: 5 %
Neutro Abs: 10 10*3/uL — ABNORMAL HIGH (ref 1.7–7.7)
Neutrophils Relative %: 64 %
PLATELETS: 365 10*3/uL (ref 150–400)
RBC: 4.31 MIL/uL (ref 3.87–5.11)
RDW: 13.7 % (ref 11.5–15.5)
WBC: 15.4 10*3/uL — AB (ref 4.0–10.5)

## 2018-04-30 LAB — URINALYSIS, ROUTINE W REFLEX MICROSCOPIC
Glucose, UA: NEGATIVE mg/dL
KETONES UR: 15 mg/dL — AB
Leukocytes, UA: NEGATIVE
NITRITE: NEGATIVE
PH: 5 (ref 5.0–8.0)
Protein, ur: 100 mg/dL — AB

## 2018-04-30 LAB — BASIC METABOLIC PANEL
Anion gap: 9 (ref 5–15)
BUN: 8 mg/dL (ref 6–20)
CHLORIDE: 105 mmol/L (ref 98–111)
CO2: 26 mmol/L (ref 22–32)
CREATININE: 1.05 mg/dL — AB (ref 0.44–1.00)
Calcium: 8.7 mg/dL — ABNORMAL LOW (ref 8.9–10.3)
GFR calc Af Amer: >60 mL/min (ref >60)
GFR calc non Af Amer: >60 mL/min (ref >60)
GLUCOSE: 88 mg/dL (ref 70–99)
POTASSIUM: 4.2 mmol/L (ref 3.5–5.1)
SODIUM: 140 mmol/L (ref 135–145)

## 2018-04-30 LAB — PREGNANCY, URINE: Preg Test, Ur: NEGATIVE

## 2018-04-30 MED ORDER — ONDANSETRON HCL 4 MG/2ML IJ SOLN
4.0000 mg | Freq: Once | INTRAMUSCULAR | Status: AC
  Administered 2018-04-30: 4 mg via INTRAVENOUS
  Filled 2018-04-30: qty 2

## 2018-04-30 MED ORDER — ONDANSETRON HCL 4 MG PO TABS: 4.0000 mg | ORAL_TABLET | Freq: Four times a day (QID) | ORAL | 0 refills | Status: DC

## 2018-04-30 MED ORDER — HYDROCODONE-ACETAMINOPHEN 5-325 MG PO TABS: 1.0000 | ORAL_TABLET | ORAL | 0 refills | Status: AC | PRN

## 2018-04-30 MED ORDER — SODIUM CHLORIDE 0.9 % IV BOLUS
1000.0000 mL | Freq: Once | INTRAVENOUS | Status: AC
  Administered 2018-04-30: 1000 mL via INTRAVENOUS

## 2018-04-30 MED ORDER — FENTANYL CITRATE (PF) 100 MCG/2ML IJ SOLN
100.0000 ug | Freq: Once | INTRAMUSCULAR | Status: AC
  Administered 2018-04-30: 100 ug via INTRAVENOUS
  Filled 2018-04-30: qty 2

## 2018-04-30 MED ORDER — FENTANYL CITRATE (PF) 100 MCG/2ML IJ SOLN
50.0000 ug | Freq: Once | INTRAMUSCULAR | Status: AC
  Administered 2018-04-30: 50 ug via INTRAVENOUS
  Filled 2018-04-30: qty 2

## 2018-04-30 NOTE — ED Provider Notes (Signed)
[facility] EMERGENCY DEPARTMENT Provider Note   CSN: [id number] Arrival date & time: 04/30/18  1356     History   Chief Complaint Chief Complaint  Patient presents with  . Flank Pain    HPI Robin Frey is a 42 y.o. female.  She presents the emergency department complaining of left flank into the left lower quadrant pain that started last evening.  No trauma.  She rates it as sharp and severe in nature.  She has had some nausea and vomiting and some urgency in her urination.  No fevers or chills no diarrhea or constipation no hematuria.  No numbness or weakness in her arms or legs.  She states this feels like a kidney stone that she had in the past.  She is tried some Goody powders with no relief.  The history is provided by the patient.  Flank Pain  This is a new problem. The current episode started yesterday. The problem occurs constantly. The problem has not changed since onset.Associated symptoms include abdominal pain. Pertinent negatives include no chest pain, no headaches and no shortness of breath. The symptoms are aggravated by bending and twisting. Nothing relieves the symptoms. She has tried ASA for the symptoms. The treatment provided no relief.    Past Medical History:  Diagnosis Date  . Asthma   . Bipolar disorder (HCC)   . Depression   . Dysrhythmia    pt states "one MD heard arrhythmia, others have not"  . GERD (gastroesophageal reflux disease)   . High cholesterol   . Neuropathy   . Obesity   . Pneumonia    multiple accounts  . Sleep apnea    nasal cannula at night    There are no active problems to display for this patient.   Past Surgical History:  Procedure Laterality Date  . APPENDECTOMY    . 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 CHOLECYSTECTOMY N/A 11/26/2014   Procedure: LAPAROSCOPIC CHOLECYSTECTOMY WITH INTRAOPERATIVE CHOLANGIOGRAM;  Surgeon: Gaynelle AduEric Wilson, MD;  Location: [facility];   Service: General;  Laterality: N/A;  . EYE SURGERY     tear duct surgery as child  . TUBAL LIGATION       OB History   None      Home Medications    Prior to Admission medications   Medication Sig Start Date End Date Taking? Authorizing Provider  albuterol (PROVENTIL HFA;VENTOLIN HFA) 108 (90 Base) MCG/ACT inhaler Inhale 1-2 puffs into the lungs every 6 (six) hours as needed for wheezing. 07/12/17   [name], Mark, MD  Fluticasone Furoate-Vilanterol (BREO ELLIPTA IN) Inhale into the lungs.    [provider]  gabapentin (NEURONTIN) 600 MG tablet Take 600 mg by mouth 3 (three) times daily.    [provider]  ibuprofen (ADVIL,MOTRIN) 800 MG tablet Take 1 tablet (800 mg total) by mouth 3 (three) times daily. 09/27/17   [name], Christopher J, MD  ondansetron (ZOFRAN ODT) 8 MG disintegrating tablet Take 1 tablet (8 mg total) by mouth every 8 (eight) hours as needed for nausea or vomiting. 03/18/18   Molpus, Jonny RuizJohn, MD    Family History Family History  Problem Relation Age of Onset  . Diabetes Other   . Hyperlipidemia Other   . Hypertension Other   . Stroke Other   . Asthma Other   . Cancer Other     Social History Social History   Tobacco Use  .  Smoking status: Current Every Day Smoker    Packs/day: 0.00    Years: 14.00    Pack years: 0.00    Types: Cigarettes  . Smokeless tobacco: Never Used  Substance Use Topics  . Alcohol use: No  . Drug use: No     Allergies   Cephalexin; Ciprofloxacin; Morphine and related; Trazodone and nefazodone; and Ketorolac tromethamine   Review of Systems Review of Systems  Constitutional: Negative for fever.  HENT: Negative for sore throat.   Eyes: Negative for visual disturbance.  Respiratory: Negative for shortness of breath.   Cardiovascular: Negative for chest pain.  Gastrointestinal: Positive for abdominal pain.  Genitourinary: Positive for flank pain and urgency. Negative for dysuria, vaginal bleeding, vaginal  discharge and vaginal pain.  Musculoskeletal: Positive for back pain. Negative for neck pain.  Skin: Negative for rash.  Neurological: Negative for headaches.     Physical Exam Updated Vital Signs BP 109/85 (BP Location: Left Arm)   Pulse 90   Temp 98.3 F (36.8 C) (Oral)   Resp 16   LMP 04/16/2018   SpO2 98%   Physical Exam  Constitutional: She is oriented to person, place, and time. She appears well-developed and well-nourished. No distress.  HENT:  Head: Normocephalic and atraumatic.  Eyes: Conjunctivae are normal.  Neck: Neck supple.  Cardiovascular: Normal rate, regular rhythm and normal heart sounds.  No murmur heard. Pulmonary/Chest: Effort normal and breath sounds normal. No respiratory distress.  Abdominal: Soft. She exhibits no mass. There is no rebound and no guarding.  Abdomen is obese but soft and no reproducible tenderness in the lower abdomen or upper abdomen.  Musculoskeletal: She exhibits tenderness. She exhibits no edema or deformity.  She some tenderness of her left paralumbar area.   Neurological: She is alert and oriented to person, place, and time. Gait normal. GCS eye subscore is 4. GCS verbal subscore is 5. GCS motor subscore is 6.  Skin: Skin is warm and dry.  Psychiatric: She has a normal mood and affect.  Nursing note and vitals reviewed.    ED Treatments / Results  Labs (all labs ordered are listed, but only abnormal results are displayed) Labs Reviewed  URINALYSIS, ROUTINE W REFLEX MICROSCOPIC - Abnormal; Notable for the following components:      Result Value   Color, Urine ORANGE (*)    Specific Gravity, Urine >1.030 (*)    Hgb urine dipstick MODERATE (*)    Bilirubin Urine SMALL (*)    Ketones, ur 15 (*)    Protein, ur 100 (*)    All other components within normal limits  BASIC METABOLIC PANEL - Abnormal; Notable for the following components:   Creatinine, Ser 1.05 (*)    Calcium 8.7 (*)    All other components within normal limits    CBC WITH DIFFERENTIAL/PLATELET - Abnormal; Notable for the following components:   WBC 15.4 (*)    Neutro Abs 10.0 (*)    Lymphs Abs 4.2 (*)    All other components within normal limits  URINALYSIS, MICROSCOPIC (REFLEX) - Abnormal; Notable for the following components:   Bacteria, UA MANY (*)    All other components within normal limits  PREGNANCY, URINE    EKG None  Radiology Ct Renal Stone Study  Result Date: 04/30/2018 CLINICAL DATA:  Left flank pain radiating to the left lower quadrant x2 days. EXAM: CT ABDOMEN AND PELVIS WITHOUT CONTRAST TECHNIQUE: Multidetector CT imaging of the abdomen and pelvis was performed following the standard protocol  without IV contrast. COMPARISON:  None. FINDINGS: Lower chest: The included heart size is normal. No pericardial effusion or thickening. Small hiatal hernia. Minimal subsegmental atelectasis in the right middle lobe and lingula. No effusion or pneumothorax. Hepatobiliary: Cholecystectomy. Unremarkable noncontrast appearance of the liver. No biliary dilatation. Pancreas: Normal Spleen: Normal Adrenals/Urinary Tract: Normal bilateral adrenal glands. Punctate nonobstructing calculus in the lower pole the right kidney. Small phleboliths are seen in the retroperitoneum along the course of the ureters. No hydroureteronephrosis. Numerous bilateral pelvic phleboliths are redemonstrated. The urinary bladder is decompressed and free of stones. No focal mural thickening is identified the degree of distention. Stomach/Bowel: Stomach is within normal limits. Appendix is surgically absent. No evidence of bowel wall thickening, distention, or inflammatory changes. Vascular/Lymphatic: Nonaneurysmal aorta.  No lymphadenopathy. Reproductive: Uterus and bilateral adnexa are unremarkable. Other: No free air nor free fluid. Musculoskeletal: No acute nor suspicious osseous lesions. Mild lumbar facet arthrosis. IMPRESSION: 1. Punctate nonobstructing right lower pole renal  calculus. No hydroureteronephrosis nor obstructing genitourinary calculus. 2. Cholecystectomy and appendectomy. No acute bowel obstruction or inflammation. Electronically Signed   By: Tollie Eth M.D.   On: 04/30/2018 16:01    Procedures Procedures (including critical care time)  Medications Ordered in ED Medications  sodium chloride 0.9 % bolus 1,000 mL (has no administration in time range)  ondansetron (ZOFRAN) injection 4 mg (has no administration in time range)  fentaNYL (SUBLIMAZE) injection 50 mcg (has no administration in time range)     Initial Impression / Assessment and Plan / ED Course  I have reviewed the triage vital signs and the nursing notes.  Pertinent labs & imaging results that were available during my care of the patient were reviewed by me and considered in my medical decision making (see chart for details).  Clinical Course as of Apr 30 1622  Mon Apr 30, 2018  6954 42 year old female with complaints of left flank left lower quadrant pain.  She states that similar to kidney stones that she has had in the past but more severe.  The pain seems very mechanical and that it causes her difficulty get on and off from the bed but she denies any trauma.  On review her prior records she has a lot of visits to the emergency department.  She is getting some lab work urinalysis and a CT renal.  IV Fluids and pain medicine.   [MB]  1554 Reviewed patient in PMP, no recent narcotic scripts.   [MB]  1556 Lab work significant for an elevated white count of 15.  Her chemistries look okay with just a slight bump in her creatinine at 1.05.  Her urinalysis shows 11-20 reds and 0-5 whites many bacteria.  She also looks dehydrated on her dip.  Awaiting reading on her CT KUB.   [MB]  1622 CT did not show any sign of ureteral stone or hydro-.  No obvious explanation for her pain.  I reviewed this with her and told her we can send her home with some nausea medicine and pain medicine and she  needs to follow-up with her doctor for further work-up.   [MB]    Clinical Course User Index [MB] Terrilee Files, MD    Final Clinical Impressions(s) / ED Diagnoses   Final diagnoses:  Left flank pain    ED Discharge Orders         Ordered    HYDROcodone-acetaminophen (NORCO/VICODIN) 5-325 MG tablet  Every 4 hours PRN     04/30/18 1625  ondansetron (ZOFRAN) 4 MG tablet  Every 6 hours     04/30/18 1625           Terrilee Files, MD 05/01/18 1126

## 2018-04-30 NOTE — ED Triage Notes (Signed)
Pt c/o lt flank pain radiating to LLQ x2 days; bladder pressure prior to urinating

## 2018-04-30 NOTE — ED Notes (Signed)
Pt requesting more pain and nausea medication.

## 2018-04-30 NOTE — Discharge Instructions (Signed)
You were evaluated in the emergency department for some flank pain and nausea vomiting.  Your lab work urinalysis and CAT scan did not show an obvious cause of your symptoms.  You will need to stay well-hydrated and we are prescribing some nausea and pain medicine to use for the next day or 2.  Please follow-up with your doctor and return if any worsening symptoms.

## 2018-05-26 ENCOUNTER — Emergency Department (HOSPITAL_COMMUNITY): Payer: Medicare Other

## 2018-05-26 ENCOUNTER — Encounter (HOSPITAL_COMMUNITY): Payer: Self-pay | Admitting: Emergency Medicine

## 2018-05-26 ENCOUNTER — Emergency Department (HOSPITAL_COMMUNITY)
Admission: EM | Admit: 2018-05-26 | Discharge: 2018-05-26 | Disposition: A | Discharge status: Home / Self Care | Payer: Medicare Other | Attending: Emergency Medicine | Admitting: Emergency Medicine

## 2018-05-26 DIAGNOSIS — F17210 Nicotine dependence, cigarettes, uncomplicated: Secondary | ICD-10-CM | POA: Diagnosis not present

## 2018-05-26 DIAGNOSIS — D72829 Elevated white blood cell count, unspecified: Secondary | ICD-10-CM | POA: Insufficient documentation

## 2018-05-26 DIAGNOSIS — R42 Dizziness and giddiness: Secondary | ICD-10-CM | POA: Diagnosis not present

## 2018-05-26 DIAGNOSIS — Z7982 Long term (current) use of aspirin: Secondary | ICD-10-CM | POA: Diagnosis not present

## 2018-05-26 DIAGNOSIS — J45909 Unspecified asthma, uncomplicated: Secondary | ICD-10-CM | POA: Diagnosis not present

## 2018-05-26 DIAGNOSIS — Z79899 Other long term (current) drug therapy: Secondary | ICD-10-CM | POA: Diagnosis not present

## 2018-05-26 DIAGNOSIS — R1013 Epigastric pain: Secondary | ICD-10-CM | POA: Insufficient documentation

## 2018-05-26 DIAGNOSIS — R1084 Generalized abdominal pain: Secondary | ICD-10-CM | POA: Diagnosis present

## 2018-05-26 LAB — DIFFERENTIAL
Abs Immature Granulocytes: 0.05 10*3/uL (ref 0.00–0.07)
BASOS ABS: 0.1 10*3/uL (ref 0.0–0.1)
BASOS PCT: 1 %
EOS ABS: 0.2 10*3/uL (ref 0.0–0.5)
EOS PCT: 1 %
Immature Granulocytes: 0 %
Lymphocytes Relative: 24 %
Lymphs Abs: 3.4 10*3/uL (ref 0.7–4.0)
MONO ABS: 0.5 10*3/uL (ref 0.1–1.0)
Monocytes Relative: 4 %
NEUTROS ABS: 9.9 10*3/uL — AB (ref 1.7–7.7)
NEUTROS PCT: 70 %

## 2018-05-26 LAB — CBC
HEMATOCRIT: 46.3 % — AB (ref 36.0–46.0)
HEMOGLOBIN: 15.2 g/dL — AB (ref 12.0–15.0)
MCH: 32.4 pg (ref 26.0–34.0)
MCHC: 32.8 g/dL (ref 30.0–36.0)
MCV: 98.7 fL (ref 80.0–100.0)
NRBC: 0 % (ref 0.0–0.2)
Platelets: 382 10*3/uL (ref 150–400)
RBC: 4.69 MIL/uL (ref 3.87–5.11)
RDW: 13.2 % (ref 11.5–15.5)
WBC: 14.1 10*3/uL — ABNORMAL HIGH (ref 4.0–10.5)

## 2018-05-26 LAB — URINALYSIS, ROUTINE W REFLEX MICROSCOPIC
BILIRUBIN URINE: NEGATIVE
Bacteria, UA: NONE SEEN
Glucose, UA: NEGATIVE mg/dL
KETONES UR: NEGATIVE mg/dL
LEUKOCYTES UA: NEGATIVE
Nitrite: NEGATIVE
PH: 5 (ref 5.0–8.0)
PROTEIN: NEGATIVE mg/dL
Specific Gravity, Urine: 1.005 (ref 1.005–1.030)

## 2018-05-26 LAB — I-STAT CHEM 8, ED
BUN: 10 mg/dL (ref 6–20)
CALCIUM ION: 1.19 mmol/L (ref 1.15–1.40)
CHLORIDE: 107 mmol/L (ref 98–111)
CREATININE: 1.1 mg/dL — AB (ref 0.44–1.00)
Glucose, Bld: 75 mg/dL (ref 70–99)
HCT: 46 % (ref 36.0–46.0)
Hemoglobin: 15.6 g/dL — ABNORMAL HIGH (ref 12.0–15.0)
Potassium: 3.9 mmol/L (ref 3.5–5.1)
SODIUM: 142 mmol/L (ref 135–145)
TCO2: 29 mmol/L (ref 22–32)

## 2018-05-26 LAB — COMPREHENSIVE METABOLIC PANEL
ALBUMIN: 3.8 g/dL (ref 3.5–5.0)
ALT: 14 U/L (ref 0–44)
ANION GAP: 8 (ref 5–15)
AST: 16 U/L (ref 15–41)
Alkaline Phosphatase: 59 U/L (ref 38–126)
BUN: 9 mg/dL (ref 6–20)
CALCIUM: 9.3 mg/dL (ref 8.9–10.3)
CHLORIDE: 106 mmol/L (ref 98–111)
CO2: 26 mmol/L (ref 22–32)
Creatinine, Ser: 1.12 mg/dL — ABNORMAL HIGH (ref 0.44–1.00)
GFR calc non Af Amer: >60 mL/min (ref >60)
Glucose, Bld: 78 mg/dL (ref 70–99)
POTASSIUM: 3.8 mmol/L (ref 3.5–5.1)
Sodium: 140 mmol/L (ref 135–145)
Total Bilirubin: 0.3 mg/dL (ref 0.3–1.2)
Total Protein: 7 g/dL (ref 6.5–8.1)

## 2018-05-26 LAB — CBG MONITORING, ED: Glucose-Capillary: 73 mg/dL (ref 70–99)

## 2018-05-26 LAB — APTT: aPTT: 31 seconds (ref 24–36)

## 2018-05-26 LAB — PROTIME-INR
INR: 1.08
Prothrombin Time: 13.9 seconds (ref 11.4–15.2)

## 2018-05-26 LAB — I-STAT BETA HCG BLOOD, ED (MC, WL, AP ONLY): I-stat hCG, quantitative: <5 m[IU]/mL (ref <5)

## 2018-05-26 LAB — I-STAT TROPONIN, ED: TROPONIN I, POC: 0 ng/mL (ref 0.00–0.08)

## 2018-05-26 LAB — LIPASE, BLOOD: LIPASE: 27 U/L (ref 11–51)

## 2018-05-26 MED ORDER — ONDANSETRON HCL 4 MG/2ML IJ SOLN
4.0000 mg | Freq: Once | INTRAMUSCULAR | Status: AC
  Administered 2018-05-26: 4 mg via INTRAVENOUS
  Filled 2018-05-26: qty 2

## 2018-05-26 MED ORDER — DICYCLOMINE HCL 20 MG PO TABS: 20.0000 mg | ORAL_TABLET | Freq: Two times a day (BID) | ORAL | 0 refills | Status: AC

## 2018-05-26 MED ORDER — OXYCODONE-ACETAMINOPHEN 5-325 MG PO TABS
1.0000 | ORAL_TABLET | Freq: Once | ORAL | Status: DC
  Filled 2018-05-26: qty 1

## 2018-05-26 MED ORDER — FENTANYL CITRATE (PF) 100 MCG/2ML IJ SOLN
50.0000 ug | Freq: Once | INTRAMUSCULAR | Status: AC
  Administered 2018-05-26: 50 ug via INTRAVENOUS
  Filled 2018-05-26: qty 2

## 2018-05-26 MED ORDER — SUCRALFATE 1 G PO TABS: 1.0000 g | ORAL_TABLET | Freq: Three times a day (TID) | ORAL | 0 refills | Status: AC

## 2018-05-26 MED ORDER — GI COCKTAIL ~~LOC~~: 30.0000 mL | Freq: Once | ORAL | Status: DC

## 2018-05-26 MED ORDER — OMEPRAZOLE 20 MG PO CPDR: 20.0000 mg | DELAYED_RELEASE_CAPSULE | Freq: Every day | ORAL | 0 refills | Status: AC

## 2018-05-26 NOTE — Discharge Instructions (Addendum)
The maximum you can take of the blue package of Goody powder is 4 packets/day.  Anything beyond this is dangerous and can put you at greater risk of harm.  Please see the information and instructions below regarding your visit.  Your diagnoses today include:  1. Epigastric pain   2. Dizziness   3. Leukocytosis, unspecified type     Your exam and testing today is reassuring that there is not a condition causing your abdominal pain that we immediately need to intervene on at this time.   Abdominal (belly) pain can be caused by many things. Your caregiver performed an examination and possibly ordered blood/urine tests and imaging (CT scan, x-rays, ultrasound). Many cases can be observed and treated at home after initial evaluation in the emergency department. Even though you are being discharged home, abdominal pain can be unpredictable. Therefore, you need a repeated exam if your pain does not resolve, returns, or worsens. Most patients with abdominal pain don't have to be admitted to the hospital or have surgery, but serious problems like appendicitis and gallbladder attacks can start out as nonspecific pain. Many abdominal conditions cannot be diagnosed in one visit, so follow-up evaluations are very important.  Tests performed today include: Blood counts and electrolytes Blood tests to check liver and kidney function Blood tests to check pancreas function Urine test to look for infection and pregnancy (in women) Vital signs. See below for your results today.  Heart enzyme tests and EKG.  See side panel of your discharge paperwork for testing performed today. Vital signs are listed at the bottom of these instructions.   Medications prescribed:    Take any prescribed medications only as prescribed, and any over the counter medications only as directed on the packaging.    Please start taking a medicine called Carafate or sucralfate.  This can be taken up to 4 times a day, with meals and  before bedtime.  Please do not take your proton pump inhibitor (Protonix, Nexium, Prilosec) within 30 minutes of taking Carafate.  You are prescribed omeprazole.  This is a medication to take daily until you see gastroenterology.  You are prescribed Bentyl.  This is a medication that is an antispasmodic agent.  You can use it twice daily to prevent spasming of the colon.  Home care instructions:  Try eating, but start with foods that have a lot of fluid in them. Good examples are soup, Jell-O, and popsicles. If you do OK with those foods, you can try soft, bland foods, such as plain yogurt. Foods that are high in carbohydrates ("carbs"), like bread or saltine crackers, can help settle your stomach. Some people also find that ginger helps with nausea. You should avoid foods that have a lot of fat in them. They can make nausea worse. Call your doctor if your symptoms come back when you try to eat.  Please follow any educational materials contained in this packet.   Follow-up instructions: Please follow-up with your primary care provider next week for further evaluation of your symptoms if they are not completely improved.   Please follow up with gastroenterology and neurology.  Gastroenterology can follow you for possible gastritis versus ulcer of the stomach.  I would like you to follow-up with neurology regarding doing your recurrent headaches and changes in vision.  Return instructions:  Please return to the Emergency Department if you experience worsening symptoms.  SEEK IMMEDIATE MEDICAL ATTENTION IF: The pain does not go away or becomes severe  A temperature  above 101F develops  Repeated vomiting occurs (multiple episodes)  The pain becomes localized to portions of the abdomen. The right side could possibly be appendicitis. In an adult, the left lower portion of the abdomen could be colitis or diverticulitis.  Blood is being passed in stools or vomit (bright red or black tarry stools)    You develop chest pain, difficulty breathing, dizziness or fainting, or become confused, poorly responsive, or inconsolable (young children) If you have any other emergent concerns regarding your health Please return to the emergency department if you develop any weakness or numbness in your extremities, fever, neck stiffness or pain.  Additional Information:   Your vital signs today were: BP 121/77    Pulse 75    Temp 98 F (36.7 C) (Oral)    Resp 16    LMP 05/18/2018    SpO2 100%  If your blood pressure (BP) was elevated on multiple readings during this visit above 130 for the top number or above 80 for the bottom number, please have this repeated by your primary care provider within one month. --------------  Thank you for allowing [place] to participate in your care today.

## 2018-05-26 NOTE — ED Provider Notes (Signed)
[facility] EMERGENCY DEPARTMENT Provider Note   CSN: [id number] Arrival date & time: 05/26/18  1026     History   Chief Complaint Chief Complaint  Patient presents with  . Aphasia  . Dizziness  . Abdominal Pain    HPI Robin Frey is a 42 y.o. female.  HPI  Patient is a 42 y.o. female with a PMH of bipolar disorder, asthma, obesity, neuropathy, and prediabetes presenting for generalized abdominal pain, intermittent blurred vision, dizziness, and generally feeling unwell. Patient reports that she began having abdominal pain in the epigastric region radiating up and down the abdomen 3 days ago. The following day she presented to [facility] and had negative abdominal workup.  She reports that subsequently the abdominal pain recurred in bilateral ovarian regions radiating into the back. Patient reports daily nausea without vomiting over this course. No fever or chills. No dysuria, urgency, or frequency. No vaginal discharge. LMP one week ago and lighter than normal for patient. Abdominal surgical history significant for appendectomy, cholecystectomy, and tubal ligation. Patient reports chronic daily headache and low back pain for which she takes up to 6 Goody Powder extra strength packets per day. Denies alcohol.   Regarding neurologic complaints, patient states that she began having blurred vision two days ago. Patient reports everything looks "not quite right". Also reports lightheadedness upon standing. No fever, chills, neck pain or stiffness, new weakness or numbness (patient has neuropathy at baseline).   Past Medical History:  Diagnosis Date  . Asthma   . Bipolar disorder (HCC)   . Depression   . Dysrhythmia    pt states "one MD heard arrhythmia, others have not"  . GERD (gastroesophageal reflux disease)   . High cholesterol   . Neuropathy   . Obesity   . Pneumonia    multiple accounts  . Sleep apnea    nasal cannula at night     There are no active problems to display for this patient.   Past Surgical History:  Procedure Laterality Date  . APPENDECTOMY    . 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 CHOLECYSTECTOMY N/A 11/26/2014   Procedure: LAPAROSCOPIC CHOLECYSTECTOMY WITH INTRAOPERATIVE CHOLANGIOGRAM;  Surgeon: Gaynelle Adu, MD;  Location: [facility];  Service: General;  Laterality: N/A;  . EYE SURGERY     tear duct surgery as child  . TUBAL LIGATION       OB History   None      Home Medications    Prior to Admission medications   Medication Sig Start Date End Date Taking? Authorizing Provider  albuterol (PROVENTIL HFA;VENTOLIN HFA) 108 (90 Base) MCG/ACT inhaler Inhale 1-2 puffs into the lungs every 6 (six) hours as needed for wheezing. 07/12/17  Yes Rolland Porter, MD  Aspirin-Acetaminophen-Caffeine (GOODY HEADACHE PO) Take 2 packets by mouth 4 (four) times daily.   Yes [provider]  fluticasone furoate-vilanterol (BREO ELLIPTA) 100-25 MCG/INH AEPB Inhale 1 puff into the lungs every evening.   Yes [provider]  gabapentin (NEURONTIN) 300 MG capsule Take 600 mg by mouth 3 (three) times daily. 04/03/18  Yes [provider]  dicyclomine (BENTYL) 20 MG tablet Take 1 tablet (20 mg total) by mouth 2 (two) times daily. 05/26/18   Aviva Kluver B, PA-C  HYDROcodone-acetaminophen (NORCO/VICODIN) 5-325 MG tablet Take 1-2 tablets by mouth every 4 (four) hours as needed for severe pain. Patient not taking: Reported on  05/26/2018 04/30/18   Terrilee Files, MD  ibuprofen (ADVIL,MOTRIN) 800 MG tablet Take 1 tablet (800 mg total) by mouth 3 (three) times daily. Patient not taking: Reported on 05/26/2018 09/27/17   Gilda Crease, MD  omeprazole (PRILOSEC) 20 MG capsule Take 1 capsule (20 mg total) by mouth daily. 05/26/18   Aviva Kluver B, PA-C  ondansetron (ZOFRAN ODT) 8 MG disintegrating tablet Take 1 tablet (8 mg total) by mouth every  8 (eight) hours as needed for nausea or vomiting. Patient not taking: Reported on 05/26/2018 03/18/18   Molpus, Jonny Ruiz, MD  ondansetron (ZOFRAN) 4 MG tablet Take 1 tablet (4 mg total) by mouth every 6 (six) hours. Patient not taking: Reported on 05/26/2018 04/30/18   Terrilee Files, MD  sucralfate (CARAFATE) 1 g tablet Take 1 tablet (1 g total) by mouth 4 (four) times daily -  with meals and at bedtime for 10 days. 05/26/18 06/05/18  Elisha Ponder, PA-C    Family History Family History  Problem Relation Age of Onset  . Diabetes Other   . Hyperlipidemia Other   . Hypertension Other   . Stroke Other   . Asthma Other   . Cancer Other     Social History Social History   Tobacco Use  . Smoking status: Current Every Day Smoker    Packs/day: 0.00    Years: 14.00    Pack years: 0.00    Types: Cigarettes  . Smokeless tobacco: Never Used  Substance Use Topics  . Alcohol use: No  . Drug use: No     Allergies   Cephalexin; Ciprofloxacin; Morphine and related; Trazodone and nefazodone; Ketorolac tromethamine; and Sulfamethoxazole-trimethoprim   Review of Systems Review of Systems  Constitutional: Negative for chills and fever.  HENT: Negative for congestion and sore throat.   Eyes: Positive for visual disturbance.  Respiratory: Negative for cough, chest tightness and shortness of breath.   Cardiovascular: Negative for chest pain, palpitations and leg swelling.  Gastrointestinal: Positive for abdominal pain and nausea. Negative for vomiting.  Genitourinary: Negative for dysuria and flank pain.  Musculoskeletal: Positive for back pain. Negative for myalgias.  Skin: Negative for rash.  Neurological: Negative for dizziness, syncope, light-headedness, numbness and headaches.     Physical Exam Updated Vital Signs BP 121/77   Pulse 75   Temp 98 F (36.7 C) (Oral)   Resp 16   LMP 05/18/2018   SpO2 100%   Physical Exam  Constitutional: She appears well-developed and  well-nourished. No distress.  HENT:  Head: Normocephalic and atraumatic.  Mouth/Throat: Oropharynx is clear and moist.  Eyes: Pupils are equal, round, and reactive to light. Conjunctivae and EOM are normal.  Neck: Normal range of motion. Neck supple.  Cardiovascular: Normal rate, regular rhythm, S1 normal and S2 normal.  No murmur heard. Pulmonary/Chest: Effort normal and breath sounds normal. She has no wheezes. She has no rales.  Abdominal: Soft. She exhibits no distension. There is tenderness in the right lower quadrant, epigastric area, periumbilical area and left lower quadrant. There is no guarding.  Musculoskeletal: Normal range of motion. She exhibits no edema or deformity.  Lymphadenopathy:    She has no cervical adenopathy.  Neurological: She is alert.  Mental Status:  Alert, oriented, thought content appropriate, able to give a coherent history. Speech fluent without evidence of aphasia. Able to follow 2 step commands without difficulty. Tearful at times.  Cranial Nerves:  II:  Peripheral visual fields grossly normal, pupils equal, round, reactive  to light III,IV, VI: ptosis not present, extra-ocular motions intact bilaterally  V,VII: smile symmetric, facial light touch sensation equal VIII: hearing grossly normal to voice  X: uvula elevates symmetrically  XI: bilateral shoulder shrug symmetric and strong XII: midline tongue extension without fassiculations Motor:  Normal tone. 5/5 in upper and lower extremities bilaterally including strong and equal grip strength and dorsiflexion/plantar flexion Sensory: Pinprick and light touch normal in all extremities.  Deep Tendon Reflexes: 2+ and symmetric in the biceps and difficult to elicit in patellar. No clonus. Cerebellar: normal finger-to-nose with bilateral upper extremities Gait: normal gait and balance Stance: No pronator drift and good coordination, strength, and position sense with tapping of bilateral arms (performed in  sitting position). CV: distal pulses palpable throughout    Skin: Skin is warm and dry. No rash noted. No erythema.  Psychiatric: She has a normal mood and affect. Her behavior is normal. Judgment and thought content normal.  Nursing note and vitals reviewed.    ED Treatments / Results  Labs (all labs ordered are listed, but only abnormal results are displayed) Labs Reviewed  COMPREHENSIVE METABOLIC PANEL - Abnormal; Notable for the following components:      Result Value   Creatinine, Ser 1.12 (*)    All other components within normal limits  CBC - Abnormal; Notable for the following components:   WBC 14.1 (*)    Hemoglobin 15.2 (*)    HCT 46.3 (*)    All other components within normal limits  URINALYSIS, ROUTINE W REFLEX MICROSCOPIC - Abnormal; Notable for the following components:   Hgb urine dipstick LARGE (*)    All other components within normal limits  DIFFERENTIAL - Abnormal; Notable for the following components:   Neutro Abs 9.9 (*)    All other components within normal limits  I-STAT CHEM 8, ED - Abnormal; Notable for the following components:   Creatinine, Ser 1.10 (*)    Hemoglobin 15.6 (*)    All other components within normal limits  LIPASE, BLOOD  PROTIME-INR  APTT  I-STAT BETA HCG BLOOD, ED ([facility], [facility], [facility] ONLY)  I-STAT TROPONIN, ED  CBG MONITORING, ED    EKG EKG Interpretation  Date/Time:  Saturday May 26 2018 10:41:31 EDT Ventricular Rate:  98 PR Interval:  128 QRS Duration: 80 QT Interval:  344 QTC Calculation: 439 R Axis:   -27 Text Interpretation:  Normal sinus rhythm Cannot rule out Anterior infarct , age undetermined Abnormal ECG Confirmed by Virgina Norfolk [phone]) on 05/26/2018 3:17:52 PM   Radiology Ct Head Wo Contrast  Result Date: 05/26/2018 CLINICAL DATA:  Dizziness, and blurred vision in both eyes. EXAM: CT HEAD WITHOUT CONTRAST TECHNIQUE: Contiguous axial images were obtained from the base of the skull through the vertex without  intravenous contrast. COMPARISON:  07/24/2016 FINDINGS: Brain: No evidence of acute infarction, hemorrhage, hydrocephalus, extra-axial collection or mass lesion/mass effect. No apparent enlargement of the pituitary gland, although it is suboptimally evaluated by CT. Vascular: No hyperdense vessel or unexpected calcification. Skull: Normal. Negative for fracture or focal lesion. Sinuses/Orbits: Minimal mucosal thickening of the ethmoid sinuses. Other: None. IMPRESSION: No acute intracranial abnormality. Electronically Signed   By: Ted Mcalpine M.D.   On: 05/26/2018 12:21   US Transvaginal Non-ob  Result Date: 05/26/2018 CLINICAL DATA:  Pelvic pain 5 days.  LMP 05/18/2018. EXAM: TRANSABDOMINAL AND TRANSVAGINAL ULTRASOUND OF PELVIS DOPPLER ULTRASOUND OF OVARIES TECHNIQUE: Both transabdominal and transvaginal ultrasound examinations of the pelvis were performed. Transabdominal technique was performed for  global imaging of the pelvis including uterus, ovaries, adnexal regions, and pelvic cul-de-sac. It was necessary to proceed with endovaginal exam following the transabdominal exam to visualize the endometrium and ovaries. Color and duplex Doppler ultrasound was utilized to evaluate blood flow to the ovaries. COMPARISON:  CT 05/24/2018 FINDINGS: Uterus Measurements: 4.4 x 5.0 x 8.5 cm.  Heterogeneous without focal mass. Endometrium Thickness: 2 mm.  No focal abnormality visualized. Right ovary Not visualized. Left ovary Not visualized. Doppler evaluation not performed due to nonvisualization of the ovaries. Other findings Trace free pelvic fluid. IMPRESSION: Normal uterus and endometrium. Nonvisualization of the ovaries (note that the ovaries are somewhat high in position and otherwise unremarkable on recent CT). Electronically Signed   By: Elberta Fortis M.D.   On: 05/26/2018 14:55   US Pelvis Complete  Result Date: 05/26/2018 CLINICAL DATA:  Pelvic pain 5 days.  LMP 05/18/2018. EXAM: TRANSABDOMINAL AND  TRANSVAGINAL ULTRASOUND OF PELVIS DOPPLER ULTRASOUND OF OVARIES TECHNIQUE: Both transabdominal and transvaginal ultrasound examinations of the pelvis were performed. Transabdominal technique was performed for global imaging of the pelvis including uterus, ovaries, adnexal regions, and pelvic cul-de-sac. It was necessary to proceed with endovaginal exam following the transabdominal exam to visualize the endometrium and ovaries. Color and duplex Doppler ultrasound was utilized to evaluate blood flow to the ovaries. COMPARISON:  CT 05/24/2018 FINDINGS: Uterus Measurements: 4.4 x 5.0 x 8.5 cm.  Heterogeneous without focal mass. Endometrium Thickness: 2 mm.  No focal abnormality visualized. Right ovary Not visualized. Left ovary Not visualized. Doppler evaluation not performed due to nonvisualization of the ovaries. Other findings Trace free pelvic fluid. IMPRESSION: Normal uterus and endometrium. Nonvisualization of the ovaries (note that the ovaries are somewhat high in position and otherwise unremarkable on recent CT). Electronically Signed   By: Elberta Fortis M.D.   On: 05/26/2018 14:55   [place] Art/ven Flow Abd Pelv Doppler  Result Date: 05/26/2018 CLINICAL DATA:  Pelvic pain 5 days.  LMP 05/18/2018. EXAM: TRANSABDOMINAL AND TRANSVAGINAL ULTRASOUND OF PELVIS DOPPLER ULTRASOUND OF OVARIES TECHNIQUE: Both transabdominal and transvaginal ultrasound examinations of the pelvis were performed. Transabdominal technique was performed for global imaging of the pelvis including uterus, ovaries, adnexal regions, and pelvic cul-de-sac. It was necessary to proceed with endovaginal exam following the transabdominal exam to visualize the endometrium and ovaries. Color and duplex Doppler ultrasound was utilized to evaluate blood flow to the ovaries. COMPARISON:  CT 05/24/2018 FINDINGS: Uterus Measurements: 4.4 x 5.0 x 8.5 cm.  Heterogeneous without focal mass. Endometrium Thickness: 2 mm.  No focal abnormality visualized. Right  ovary Not visualized. Left ovary Not visualized. Doppler evaluation not performed due to nonvisualization of the ovaries. Other findings Trace free pelvic fluid. IMPRESSION: Normal uterus and endometrium. Nonvisualization of the ovaries (note that the ovaries are somewhat high in position and otherwise unremarkable on recent CT). Electronically Signed   By: Elberta Fortis M.D.   On: 05/26/2018 14:55    Procedures Procedures (including critical care time)  Medications Ordered in ED Medications  fentaNYL (SUBLIMAZE) injection 50 mcg (50 mcg Intravenous Given 05/26/18 1339)  ondansetron (ZOFRAN) injection 4 mg (4 mg Intravenous Given 05/26/18 1344)     Initial Impression / Assessment and Plan / ED Course  I have reviewed the triage vital signs and the nursing notes.  Pertinent labs & imaging results that were available during my care of the patient were reviewed by me and considered in my medical decision making (see chart for details).  Clinical Course as of  May 26 2306  Sat May 26, 2018  1300 NEUT#(!): 9.9 [AM]  1300 Patient persistently has leukocytosis over time.   WBC(!): 14.1 [AM]  1302 No space occupying lesions.   CT HEAD WO CONTRAST [AM]  1434 Spoke with radiology secretary who stated that CT scan abdomen pelvis [facility] on 05-24-2018 showed no acute abnormality.   [AM]    Clinical Course User Index [AM] Elisha Ponder, PA-C    Patient is nontoxic appearing, afebrile, and in no acute distress. Differential diagnosis for patient's abdominal pain include NSAD-induced gastritis, UTI, PID, diverticulitis. Regarding patient's neurologic complaints, differential diagnosis includes idiopathic intracranial hypertension, CVA, space occupying lesion.   Labwork demonstrating leukocytosis, which is chronic for patient and patient is aware of this condition. CT abdomen[facility]pelvis on 05/24/18 at [facility] read via help of PACS system. It demonstrated no acute  findings. TVUS today with no acute findings. Suspect possible NSAD-induced gastritis. Patient given extensive counseling on this patter but eloped off the floor without receiving medications or referral. Return precautions were given for any worsening symptoms, fever or chills, or progressive neurologic symptoms.   Final Clinical Impressions(s) / ED Diagnoses   Final diagnoses:  Epigastric pain  Dizziness  Leukocytosis, unspecified type    ED Discharge Orders         Ordered    Ambulatory referral to Gastroenterology     05/26/18 1519    omeprazole (PRILOSEC) 20 MG capsule  Daily     05/26/18 1520    sucralfate (CARAFATE) 1 g tablet  3 times daily with meals & bedtime     05/26/18 1520    dicyclomine (BENTYL) 20 MG tablet  2 times daily     05/26/18 1520    Ambulatory referral to Neurology    Comments:  An appointment is requested in approximately: 2 weeks   05/26/18 1525           Delia Chimes 05/26/18 2328    Virgina Norfolk, DO 05/27/18 [id number]

## 2018-05-26 NOTE — ED Notes (Addendum)
Pt stable, ambulatory, and verbalizes understanding d/c instructions.   Pt not willing to sign.

## 2018-05-26 NOTE — ED Triage Notes (Signed)
Pt to ER for evaluation of dizziness, blurred vision to both eyes, and feeling like she "knows what I want to say but I can't get it out." symptom onset Thursday. Also reports epigastric abdominal pain with dry heaves.

## 2018-05-30 ENCOUNTER — Ambulatory Visit: Payer: Medicare Other | Admitting: Gastroenterology

## 2018-07-14 ENCOUNTER — Other Ambulatory Visit: Payer: Self-pay

## 2018-07-14 ENCOUNTER — Emergency Department (HOSPITAL_COMMUNITY)
Admission: EM | Admit: 2018-07-14 | Discharge: 2018-07-14 | Discharge status: Against Medical Advice | Payer: Medicare Other | Attending: Emergency Medicine | Admitting: Emergency Medicine

## 2018-07-14 ENCOUNTER — Encounter (HOSPITAL_COMMUNITY): Payer: Self-pay | Admitting: *Deleted

## 2018-07-14 DIAGNOSIS — J45909 Unspecified asthma, uncomplicated: Secondary | ICD-10-CM | POA: Insufficient documentation

## 2018-07-14 DIAGNOSIS — G8929 Other chronic pain: Secondary | ICD-10-CM

## 2018-07-14 DIAGNOSIS — R42 Dizziness and giddiness: Secondary | ICD-10-CM

## 2018-07-14 DIAGNOSIS — Z79899 Other long term (current) drug therapy: Secondary | ICD-10-CM | POA: Diagnosis not present

## 2018-07-14 DIAGNOSIS — F17210 Nicotine dependence, cigarettes, uncomplicated: Secondary | ICD-10-CM | POA: Insufficient documentation

## 2018-07-14 DIAGNOSIS — R109 Unspecified abdominal pain: Secondary | ICD-10-CM

## 2018-07-14 DIAGNOSIS — R1012 Left upper quadrant pain: Secondary | ICD-10-CM | POA: Diagnosis present

## 2018-07-14 LAB — CBC
HEMATOCRIT: 41.6 % (ref 36.0–46.0)
HEMOGLOBIN: 13.6 g/dL (ref 12.0–15.0)
MCH: 32.9 pg (ref 26.0–34.0)
MCHC: 32.7 g/dL (ref 30.0–36.0)
MCV: 100.7 fL — ABNORMAL HIGH (ref 80.0–100.0)
NRBC: 0 % (ref 0.0–0.2)
Platelets: 388 10*3/uL (ref 150–400)
RBC: 4.13 MIL/uL (ref 3.87–5.11)
RDW: 13.5 % (ref 11.5–15.5)
WBC: 14.7 10*3/uL — AB (ref 4.0–10.5)

## 2018-07-14 LAB — COMPREHENSIVE METABOLIC PANEL
ALT: 16 U/L (ref 0–44)
AST: 14 U/L — ABNORMAL LOW (ref 15–41)
Albumin: 3.9 g/dL (ref 3.5–5.0)
Alkaline Phosphatase: 61 U/L (ref 38–126)
Anion gap: 9 (ref 5–15)
BUN: 11 mg/dL (ref 6–20)
CHLORIDE: 107 mmol/L (ref 98–111)
CO2: 25 mmol/L (ref 22–32)
Calcium: 9.2 mg/dL (ref 8.9–10.3)
Creatinine, Ser: 0.97 mg/dL (ref 0.44–1.00)
GFR calc Af Amer: >60 mL/min (ref >60)
Glucose, Bld: 83 mg/dL (ref 70–99)
POTASSIUM: 4 mmol/L (ref 3.5–5.1)
Sodium: 141 mmol/L (ref 135–145)
Total Bilirubin: 0.6 mg/dL (ref 0.3–1.2)
Total Protein: 7.5 g/dL (ref 6.5–8.1)

## 2018-07-14 LAB — I-STAT BETA HCG BLOOD, ED (MC, WL, AP ONLY): I-stat hCG, quantitative: <5 m[IU]/mL (ref <5)

## 2018-07-14 LAB — LIPASE, BLOOD: LIPASE: 22 U/L (ref 11–51)

## 2018-07-14 LAB — I-STAT TROPONIN, ED: TROPONIN I, POC: 0 ng/mL (ref 0.00–0.08)

## 2018-07-14 MED ORDER — FLUOXETINE HCL 20 MG PO CAPS
20.00 | ORAL_CAPSULE | ORAL | Status: DC
Start: 2018-07-13 — End: 2018-07-14

## 2018-07-14 MED ORDER — SODIUM CHLORIDE 0.9 % IV BOLUS
1000.0000 mL | Freq: Once | INTRAVENOUS | Status: AC
  Administered 2018-07-14: 1000 mL via INTRAVENOUS

## 2018-07-14 MED ORDER — ONDANSETRON 4 MG PO TBDP
4.0000 mg | ORAL_TABLET | Freq: Once | ORAL | Status: AC | PRN
  Administered 2018-07-14: 4 mg via ORAL
  Filled 2018-07-14: qty 1

## 2018-07-14 NOTE — ED Provider Notes (Signed)
[facility] DEPT Provider Note   CSN: [id number] Arrival date & time: 07/14/18  1722     History   Chief Complaint Chief Complaint  Patient presents with  . Abdominal Pain  . Near Syncope    HPI Robin Frey is a 42 y.o. female is here for evaluation of abdominal pain and light-headedness.  Pt states she stood up to walk to the bathroom when she felt sudden onset dizziness described as "my eyes crossed", things started to spin. She then felt legs, arms, body tingling and tightening throughout. Her legs gave out and she fell to the ground, states she blacked out.  Her husband was 5 ft away and heard her fall so she walked towards her. He found her on her back on the ground, when he spoke to her she woke up.  There was no confusion, bladder or bowel incontinence.  Pt then developed severe, stabbing periumbilical abdominal pain, constant, radiating to bilateral sides, flanks, low back.  Other associated symptoms include band like headache, squeezing around her temples.  Currently reports intermittent dizziness with head movements and severe abdominal pain.  No interventions. No alleviating factors.  States she has similar symptoms a few weeks ago but this time symptoms are not as bad.  I asked pt about her chronic, ongoing abdominal pain and light-headedness, and multiple ER visits since October. She states she was told to f/u with neurology. She went to a walk in clinic and they were supposed to refer her to neuro but she never called back.  She has daily headaches and takes several goody powders daily.  States tylenol ibuprofen do not work. States she is allergic to toradol because it causes her an itchy rash.  This is pt's 9th ER visit for abdominal pain and light-headedness, syncope in the last 2 months.   She denies associated CP, palpitations, SOB, blood in stools, dysuria, hematuria, frequency, urgency. No h/o kidney stones.  LMP October, began spotting 2 days  ago.    HPI  Past Medical History:  Diagnosis Date  . Asthma   . Bipolar disorder ([place])   . Depression   . Dysrhythmia    pt states "one MD heard arrhythmia, others have not"  . GERD (gastroesophageal reflux disease)   . High cholesterol   . Neuropathy   . Obesity   . Pneumonia    multiple accounts  . Sleep apnea    nasal cannula at night    There are no active problems to display for this patient.   Past Surgical History:  Procedure Laterality Date  . APPENDECTOMY    . 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 CHOLECYSTECTOMY N/A 11/26/2014   Procedure: LAPAROSCOPIC CHOLECYSTECTOMY WITH INTRAOPERATIVE CHOLANGIOGRAM;  Surgeon: Greer Pickerel, MD;  Location: [facility];  Service: General;  Laterality: N/A;  . EYE SURGERY     tear duct surgery as child  . TUBAL LIGATION       OB History   None      Home Medications    Prior to Admission medications   Medication Sig Start Date End Date Taking? Authorizing Provider  albuterol (PROVENTIL HFA;VENTOLIN HFA) 108 (90 Base) MCG/ACT inhaler Inhale 1-2 puffs into the lungs every 6 (six) hours as needed for wheezing. 07/12/17   Tanna Furry, MD  Aspirin-Acetaminophen-Caffeine (GOODY HEADACHE PO) Take 2 packets by mouth 4 (four) times daily.    [provider]  dicyclomine (BENTYL) 20 MG tablet Take 1 tablet (20 mg total) by mouth 2 (two) times daily. 05/26/18   Langston Masker B, PA-C  fluticasone furoate-vilanterol (BREO ELLIPTA) 100-25 MCG/INH AEPB Inhale 1 puff into the lungs every evening.    [provider]  gabapentin (NEURONTIN) 300 MG capsule Take 600 mg by mouth 3 (three) times daily. 04/03/18   [provider]  HYDROcodone-acetaminophen (NORCO/VICODIN) 5-325 MG tablet Take 1-2 tablets by mouth every 4 (four) hours as needed for severe pain. Patient not taking: Reported on 05/26/2018 04/30/18   Hayden Rasmussen, MD  ibuprofen (ADVIL,MOTRIN) 800 MG tablet Take 1  tablet (800 mg total) by mouth 3 (three) times daily. Patient not taking: Reported on 05/26/2018 09/27/17   Orpah Greek, MD  omeprazole (PRILOSEC) 20 MG capsule Take 1 capsule (20 mg total) by mouth daily. 05/26/18   Langston Masker B, PA-C  ondansetron (ZOFRAN ODT) 8 MG disintegrating tablet Take 1 tablet (8 mg total) by mouth every 8 (eight) hours as needed for nausea or vomiting. Patient not taking: Reported on 05/26/2018 03/18/18   Molpus, Jenny Reichmann, MD  ondansetron (ZOFRAN) 4 MG tablet Take 1 tablet (4 mg total) by mouth every 6 (six) hours. Patient not taking: Reported on 05/26/2018 04/30/18   Hayden Rasmussen, MD  sucralfate (CARAFATE) 1 g tablet Take 1 tablet (1 g total) by mouth 4 (four) times daily -  with meals and at bedtime for 10 days. 05/26/18 06/05/18  Albesa Seen, PA-C    Family History Family History  Problem Relation Age of Onset  . Diabetes Other   . Hyperlipidemia Other   . Hypertension Other   . Stroke Other   . Asthma Other   . Cancer Other     Social History Social History   Tobacco Use  . Smoking status: Current Every Day Smoker    Packs/day: 1.00    Years: 14.00    Pack years: 14.00    Types: Cigarettes  . Smokeless tobacco: Never Used  Substance Use Topics  . Alcohol use: Not Currently    Comment: occasional  . Drug use: No     Allergies   Cephalexin; Ciprofloxacin; Morphine and related; Trazodone and nefazodone; Ketorolac tromethamine; and Sulfamethoxazole-trimethoprim   Review of Systems Review of Systems  Gastrointestinal: Positive for abdominal pain.  Neurological: Positive for dizziness, syncope and light-headedness.  All other systems reviewed and are negative.    Physical Exam Updated Vital Signs BP 120/74 (BP Location: Left Arm)   Pulse 64   Temp 97.8 F (36.6 C) (Oral)   Resp 17   Ht 5' 7"  (1.702 m)   Wt 118.8 kg   LMP 05/18/2018   SpO2 99%   BMI 41.04 kg/m   Physical Exam  Constitutional: She is oriented to  person, place, and time. She appears well-developed and well-nourished.  Non toxic  HENT:  Head: Normocephalic and atraumatic.  Nose: Nose normal.  Eyes: Pupils are equal, round, and reactive to light. Conjunctivae and EOM are normal.  Neck: Normal range of motion.  Cardiovascular: Normal rate and regular rhythm.  Pulmonary/Chest: Effort normal and breath sounds normal.  Abdominal: Soft. Bowel sounds are normal. There is tenderness in the epigastric area and left upper quadrant.  No G/R/R. No suprapubic or CVA tenderness. Negative Murphy's and McBurney's.  Musculoskeletal: Normal range of motion.  Neurological: She is alert and oriented to person, place, and time.  Alert and oriented to self, place, time and  event.  Speech is fluent without obvious dysarthria or aphasia. Strength 5/5 in upper and lower extremities   Sensation to light touch intact in bilateral face, upper and lower extremities No truncal sway. No pronator drift. No leg drop.  Normal finger-to-nose and finger tapping.  CN II-XII grossly intact bilaterally.   Hints exam: Horizontal nystagmus to left. Saccade correction noted to midline. No vertical eye deviation.  Skin: Skin is warm and dry. Capillary refill takes less than 2 seconds.  Psychiatric: She has a normal mood and affect. Her behavior is normal.  Nursing note and vitals reviewed.    ED Treatments / Results  Labs (all labs ordered are listed, but only abnormal results are displayed) Labs Reviewed  COMPREHENSIVE METABOLIC PANEL - Abnormal; Notable for the following components:      Result Value   AST 14 (*)    All other components within normal limits  CBC - Abnormal; Notable for the following components:   WBC 14.7 (*)    MCV 100.7 (*)    All other components within normal limits  LIPASE, BLOOD  URINALYSIS, ROUTINE W REFLEX MICROSCOPIC  RAPID URINE DRUG SCREEN, HOSP PERFORMED  I-STAT BETA HCG BLOOD, ED ([facility], [facility], [facility] ONLY)  I-STAT TROPONIN, ED     EKG None  Radiology No results found.  Procedures Procedures (including critical care time)  Medications Ordered in ED Medications  ondansetron (ZOFRAN-ODT) disintegrating tablet 4 mg (4 mg Oral Given 07/14/18 1817)  sodium chloride 0.9 % bolus 1,000 mL (0 mLs Intravenous Stopped 07/14/18 1910)     Initial Impression / Assessment and Plan / ED Course  I have reviewed the triage vital signs and the nursing notes.  Pertinent labs & imaging results that were available during my care of the patient were reviewed by me and considered in my medical decision making (see chart for details).      #Dizziness vs presyncope Pt reports positional, brief dizziness described as things moving especially with eye movements and head rotation, standing up.  Neuro exam reassuring. No cerebellar findings. Hints exam suggestive of peripheral etiology.  This could be from dehydration.  We will obtain screening labs, EKG.  I doubt arrhythmia as she has no CP, SOB, palpitations.  Doubt PE, PERC negative.  Will dose meclizine, IVF, reassess.   # abdominal pain Highest suspicion for goody powder induced gastritis vs GERD vs PUD.  She has epigastric tenderness and LUQ tenderness. No suprapubic or CVAT.  Given radiation of pain also considering renal stone, pyelonephritis/UTI.  No recent diarrhea, fevers, UTI symptoms, vaginal complaints.  Dissection lower on differential.  We will obtain screening labs and reassess.    Of note, this is pt's 9th ER visit in the last 2 months for abdominal pain, dizziness, presyncope/syncope.  Extensive chart review reveals she has had at least 3 abdominal CTs, 1 head CT, two pelvic US.  Her work up has not revealed emergent process. She has been referred to neurology but has not established care yet.  Pt asked me what I was going to give her for pain.  I told her I would not be using narcotic pain medications given chronicity of abdominal pain.  I recommended toradol and she  states she is allergic (rash).  I recommended vertigo management, IVF, haldol for nausea/abdominal pain.  I was notified by RN pt is requesting to be discharged. She removed her IV.  I met with pt again and asked her the reason for leaving. She did not  want to elaborate.  I discussed labs have not resulted and I cannot exclude a lift threatening process without results.  She verbalized understanding and requested immediate dc.  Pt left AMA. Prior to leaving I encouraged her to f/u with neurology and GI.  Final Clinical Impressions(s) / ED Diagnoses   Final diagnoses:  Chronic abdominal pain  Dizziness    ED Discharge Orders    None       Arlean Hopping 07/14/18 2022    Jola Schmidt, MD 07/15/18 [phone]

## 2018-07-14 NOTE — ED Notes (Signed)
ED Provider at bedside. 

## 2018-07-14 NOTE — ED Notes (Signed)
This Clinical research associatewriter called into room because patient is requesting to leave AMA at this time. IV removed and husband at bedside with patient. Will notify provider regarding patient status.

## 2018-07-14 NOTE — ED Triage Notes (Signed)
Pt reports feeling faint, n/v, severe abd pain, passing out for 10-12 seconds and headache. Pt reports that symptoms started today. Pt reports having an episode like this in the past and was told she was dehydrated.  Pt a/o x 4 and ambulatory in triage.

## 2018-07-24 ENCOUNTER — Telehealth: Payer: Self-pay | Admitting: *Deleted

## 2018-07-24 ENCOUNTER — Ambulatory Visit: Payer: Self-pay | Admitting: Neurology

## 2018-07-24 NOTE — Telephone Encounter (Signed)
No showed new patient appointment. 

## 2018-07-25 ENCOUNTER — Encounter: Payer: Self-pay | Admitting: Neurology

## 2019-11-07 ENCOUNTER — Emergency Department (HOSPITAL_BASED_OUTPATIENT_CLINIC_OR_DEPARTMENT_OTHER): Payer: Medicare Other

## 2019-11-07 ENCOUNTER — Other Ambulatory Visit: Payer: Self-pay

## 2019-11-07 ENCOUNTER — Emergency Department (HOSPITAL_BASED_OUTPATIENT_CLINIC_OR_DEPARTMENT_OTHER)
Admission: EM | Admit: 2019-11-07 | Discharge: 2019-11-07 | Disposition: A | Discharge status: Home / Self Care | Payer: Medicare Other | Attending: Emergency Medicine | Admitting: Emergency Medicine

## 2019-11-07 ENCOUNTER — Encounter (HOSPITAL_BASED_OUTPATIENT_CLINIC_OR_DEPARTMENT_OTHER): Payer: Self-pay | Admitting: Emergency Medicine

## 2019-11-07 DIAGNOSIS — R109 Unspecified abdominal pain: Secondary | ICD-10-CM | POA: Insufficient documentation

## 2019-11-07 DIAGNOSIS — F17210 Nicotine dependence, cigarettes, uncomplicated: Secondary | ICD-10-CM | POA: Diagnosis not present

## 2019-11-07 DIAGNOSIS — R0600 Dyspnea, unspecified: Secondary | ICD-10-CM | POA: Diagnosis not present

## 2019-11-07 DIAGNOSIS — R1032 Left lower quadrant pain: Secondary | ICD-10-CM | POA: Diagnosis not present

## 2019-11-07 DIAGNOSIS — R600 Localized edema: Secondary | ICD-10-CM

## 2019-11-07 DIAGNOSIS — J45909 Unspecified asthma, uncomplicated: Secondary | ICD-10-CM | POA: Insufficient documentation

## 2019-11-07 DIAGNOSIS — D72829 Elevated white blood cell count, unspecified: Secondary | ICD-10-CM

## 2019-11-07 DIAGNOSIS — R2243 Localized swelling, mass and lump, lower limb, bilateral: Secondary | ICD-10-CM | POA: Diagnosis not present

## 2019-11-07 DIAGNOSIS — Z79899 Other long term (current) drug therapy: Secondary | ICD-10-CM | POA: Insufficient documentation

## 2019-11-07 DIAGNOSIS — J189 Pneumonia, unspecified organism: Secondary | ICD-10-CM

## 2019-11-07 DIAGNOSIS — R110 Nausea: Secondary | ICD-10-CM | POA: Diagnosis not present

## 2019-11-07 DIAGNOSIS — R319 Hematuria, unspecified: Secondary | ICD-10-CM

## 2019-11-07 LAB — URINALYSIS, ROUTINE W REFLEX MICROSCOPIC
Bilirubin Urine: NEGATIVE
Glucose, UA: NEGATIVE mg/dL
Ketones, ur: NEGATIVE mg/dL
Leukocytes,Ua: NEGATIVE
Nitrite: NEGATIVE
Protein, ur: NEGATIVE mg/dL
Specific Gravity, Urine: >1.03 — ABNORMAL HIGH (ref 1.005–1.030)
pH: 5.5 (ref 5.0–8.0)

## 2019-11-07 LAB — CBC
HCT: 41.6 % (ref 36.0–46.0)
Hemoglobin: 14.1 g/dL (ref 12.0–15.0)
MCH: 33.8 pg (ref 26.0–34.0)
MCHC: 33.9 g/dL (ref 30.0–36.0)
MCV: 99.8 fL (ref 80.0–100.0)
Platelets: 378 10*3/uL (ref 150–400)
RBC: 4.17 MIL/uL (ref 3.87–5.11)
RDW: 15 % (ref 11.5–15.5)
WBC: 22.1 10*3/uL — ABNORMAL HIGH (ref 4.0–10.5)
nRBC: 0 % (ref 0.0–0.2)

## 2019-11-07 LAB — DIFFERENTIAL
Abs Immature Granulocytes: 0.12 10*3/uL — ABNORMAL HIGH (ref 0.00–0.07)
Basophils Absolute: 0.1 10*3/uL (ref 0.0–0.1)
Basophils Relative: 1 %
Eosinophils Absolute: 0.4 10*3/uL (ref 0.0–0.5)
Eosinophils Relative: 2 %
Immature Granulocytes: 1 %
Lymphocytes Relative: 14 %
Lymphs Abs: 3.2 10*3/uL (ref 0.7–4.0)
Monocytes Absolute: 0.9 10*3/uL (ref 0.1–1.0)
Monocytes Relative: 4 %
Neutro Abs: 17.3 10*3/uL — ABNORMAL HIGH (ref 1.7–7.7)
Neutrophils Relative %: 78 %

## 2019-11-07 LAB — COMPREHENSIVE METABOLIC PANEL
ALT: 18 U/L (ref 0–44)
AST: 19 U/L (ref 15–41)
Albumin: 3.8 g/dL (ref 3.5–5.0)
Alkaline Phosphatase: 73 U/L (ref 38–126)
Anion gap: 11 (ref 5–15)
BUN: 20 mg/dL (ref 6–20)
CO2: 22 mmol/L (ref 22–32)
Calcium: 8.5 mg/dL — ABNORMAL LOW (ref 8.9–10.3)
Chloride: 106 mmol/L (ref 98–111)
Creatinine, Ser: 1 mg/dL (ref 0.44–1.00)
GFR calc Af Amer: >60 mL/min (ref >60)
GFR calc non Af Amer: >60 mL/min (ref >60)
Glucose, Bld: 83 mg/dL (ref 70–99)
Potassium: 3.8 mmol/L (ref 3.5–5.1)
Sodium: 139 mmol/L (ref 135–145)
Total Bilirubin: 0.3 mg/dL (ref 0.3–1.2)
Total Protein: 7.4 g/dL (ref 6.5–8.1)

## 2019-11-07 LAB — TROPONIN I (HIGH SENSITIVITY): Troponin I (High Sensitivity): 3 ng/L (ref <18)

## 2019-11-07 LAB — URINALYSIS, MICROSCOPIC (REFLEX)

## 2019-11-07 LAB — BRAIN NATRIURETIC PEPTIDE: B Natriuretic Peptide: 73.7 pg/mL (ref 0.0–100.0)

## 2019-11-07 LAB — PREGNANCY, URINE: Preg Test, Ur: NEGATIVE

## 2019-11-07 MED ORDER — AMOXICILLIN-POT CLAVULANATE 875-125 MG PO TABS: 1.0000 | ORAL_TABLET | Freq: Two times a day (BID) | ORAL | 0 refills | Status: DC

## 2019-11-07 MED ORDER — ONDANSETRON 4 MG PO TBDP: 4.0000 mg | ORAL_TABLET | Freq: Three times a day (TID) | ORAL | 0 refills | Status: DC | PRN

## 2019-11-07 MED ORDER — ALBUTEROL SULFATE HFA 108 (90 BASE) MCG/ACT IN AERS
2.0000 | INHALATION_SPRAY | Freq: Once | RESPIRATORY_TRACT | Status: AC
  Administered 2019-11-07: 2 via RESPIRATORY_TRACT
  Filled 2019-11-07: qty 6.7

## 2019-11-07 MED ORDER — FENTANYL CITRATE (PF) 100 MCG/2ML IJ SOLN
50.0000 ug | Freq: Once | INTRAMUSCULAR | Status: AC
  Administered 2019-11-07: 50 ug via INTRAVENOUS
  Filled 2019-11-07: qty 2

## 2019-11-07 MED ORDER — IOHEXOL 350 MG/ML SOLN
100.0000 mL | Freq: Once | INTRAVENOUS | Status: AC | PRN
  Administered 2019-11-07: 100 mL via INTRAVENOUS

## 2019-11-07 MED ORDER — DOXYCYCLINE HYCLATE 100 MG PO CAPS: 100.0000 mg | ORAL_CAPSULE | Freq: Two times a day (BID) | ORAL | 0 refills | Status: DC

## 2019-11-07 MED ORDER — IOHEXOL 350 MG/ML SOLN: 100.0000 mL | Freq: Once | INTRAVENOUS | Status: DC

## 2019-11-07 MED ORDER — ONDANSETRON HCL 4 MG/2ML IJ SOLN
4.0000 mg | Freq: Once | INTRAMUSCULAR | Status: AC
  Administered 2019-11-07: 4 mg via INTRAVENOUS
  Filled 2019-11-07: qty 2

## 2019-11-07 NOTE — Discharge Instructions (Addendum)
You were seen in the ER today for leg swelling, abdominal pain, and vomiting. Your labs showed that your white blood cell count was elevated and that you had blood in your urine- please have each of these rechecked by your primary care provider within 1 week.   Your CT of the abdomen did not show any urinary tract stones or acute abnormalities.   Your ultrasounds did not show blood clots in your legs  Your CT scan showed findings that could represent pneumonia or possibly heart failure.  We are sending you home with antibiotics, doxycycline and Augmentin, to treat possible pneumonia.  Please take these as prescribed.  We are also sending you home with Zofran to take every 8 hours as needed for nausea and vomiting.  We would like you to follow-up very closely with your primary care provider within 3 days for reevaluation of your symptoms.  If you are not having improvement with antibiotics you may need an ultrasound of your heart and potentially pills to help with fluid removal.  Return to the emergency department for new or worsening symptoms including but not limited to inability to keep fluids down, fever, increased pain, increased leg swelling, trouble breathing, chest pain, passing out, or any other concerns.  We have prescribed you new medication(s) today. Discuss the medications prescribed today with your pharmacist as they can have adverse effects and interactions with your other medicines including over the counter and prescribed medications. Seek medical evaluation if you start to experience new or abnormal symptoms after taking one of these medicines, seek care immediately if you start to experience difficulty breathing, feeling of your throat closing, facial swelling, or rash as these could be indications of a more serious allergic reaction  We have tested you for COVID 19, we will call you within the next 72 hours if results are positive, you may also view these results on MyChart.   We are  instructing patient's with COVID 19 or symptoms of COVID 19 to quarantine themselves for 14 days. You may be able to discontinue self quarantine if the following conditions are met:   Persons with COVID-19 who have symptoms and were directed to care for themselves at home may discontinue home isolation under the  following conditions: - It has been at least 7 days have passed since symptoms first appeared. - AND at least 3 days (72 hours) have passed since recovery defined as resolution of fever without the use of fever-reducing medications and improvement in respiratory symptoms (e.g., cough, shortness of breath)  Please follow the below quarantine instructions.       Person Under Monitoring Name: Robin Frey  Location: [place] [address]   Infection Prevention Recommendations for Individuals Confirmed to have, or Being Evaluated for, 2019 Novel Coronavirus (COVID-19) Infection Who Receive Care at Home  Individuals who are confirmed to have, or are being evaluated for, COVID-19 should follow the prevention steps below until a healthcare provider or local or state health department says they can return to normal activities.  Stay home except to get medical care You should restrict activities outside your home, except for getting medical care. Do not go to work, school, or public areas, and do not use public transportation or taxis.  Call ahead before visiting your doctor Before your medical appointment, call the healthcare provider and tell them that you have, or are being evaluated for, COVID-19 infection. This will help the healthcare provider's office take steps to keep  other people from getting infected. Ask your healthcare provider to call the local or state health department.  Monitor your symptoms Seek prompt medical attention if your illness is worsening (e.g., difficulty breathing). Before going to your medical appointment, call the healthcare  provider and tell them that you have, or are being evaluated for, COVID-19 infection. Ask your healthcare provider to call the local or state health department.  Wear a facemask You should wear a facemask that covers your nose and mouth when you are in the same room with other people and when you visit a healthcare provider. People who live with or visit you should also wear a facemask while they are in the same room with you.  Separate yourself from other people in your home As much as possible, you should stay in a different room from other people in your home. Also, you should use a separate bathroom, if available.  Avoid sharing household items You should not share dishes, drinking glasses, cups, eating utensils, towels, bedding, or other items with other people in your home. After using these items, you should wash them thoroughly with soap and water.  Cover your coughs and sneezes Cover your mouth and nose with a tissue when you cough or sneeze, or you can cough or sneeze into your sleeve. Throw used tissues in a lined trash can, and immediately wash your hands with soap and water for at least 20 seconds or use an alcohol-based hand rub.  Wash your [facility] your hands often and thoroughly with soap and water for at least 20 seconds. You can use an alcohol-based hand sanitizer if soap and water are not available and if your hands are not visibly dirty. Avoid touching your eyes, nose, and mouth with unwashed hands.   Prevention Steps for Caregivers and Household Members of Individuals Confirmed to have, or Being Evaluated for, COVID-19 Infection Being Cared for in the Home  If you live with, or provide care at home for, a person confirmed to have, or being evaluated for, COVID-19 infection please follow these guidelines to prevent infection:  Follow healthcare provider's instructions Make sure that you understand and can help the patient follow any healthcare provider  instructions for all care.  Provide for the patient's basic needs You should help the patient with basic needs in the home and provide support for getting groceries, prescriptions, and other personal needs.  Monitor the patient's symptoms If they are getting sicker, call his or her medical provider and tell them that the patient has, or is being evaluated for, COVID-19 infection. This will help the healthcare provider's office take steps to keep other people from getting infected. Ask the healthcare provider to call the local or state health department.  Limit the number of people who have contact with the patient If possible, have only one caregiver for the patient. Other household members should stay in another home or place of residence. If this is not possible, they should stay in another room, or be separated from the patient as much as possible. Use a separate bathroom, if available. Restrict visitors who do not have an essential need to be in the home.  Keep older adults, very young children, and other sick people away from the patient Keep older adults, very young children, and those who have compromised immune systems or chronic health conditions away from the patient. This includes people with chronic heart, lung, or kidney conditions, diabetes, and cancer.  Ensure good ventilation Make sure that shared  spaces in the home have good air flow, such as from an air conditioner or an opened window, weather permitting.  Wash your hands often Wash your hands often and thoroughly with soap and water for at least 20 seconds. You can use an alcohol based hand sanitizer if soap and water are not available and if your hands are not visibly dirty. Avoid touching your eyes, nose, and mouth with unwashed hands. Use disposable paper towels to dry your hands. If not available, use dedicated cloth towels and replace them when they become wet.  Wear a facemask and gloves Wear a disposable  facemask at all times in the room and gloves when you touch or have contact with the patient's blood, body fluids, and/or secretions or excretions, such as sweat, saliva, sputum, nasal mucus, vomit, urine, or feces.  Ensure the mask fits over your nose and mouth tightly, and do not touch it during use. Throw out disposable facemasks and gloves after using them. Do not reuse. Wash your hands immediately after removing your facemask and gloves. If your personal clothing becomes contaminated, carefully remove clothing and launder. Wash your hands after handling contaminated clothing. Place all used disposable facemasks, gloves, and other waste in a lined container before disposing them with other household waste. Remove gloves and wash your hands immediately after handling these items.  Do not share dishes, glasses, or other household items with the patient Avoid sharing household items. You should not share dishes, drinking glasses, cups, eating utensils, towels, bedding, or other items with a patient who is confirmed to have, or being evaluated for, COVID-19 infection. After the person uses these items, you should wash them thoroughly with soap and water.  Wash laundry thoroughly Immediately remove and wash clothes or bedding that have blood, body fluids, and/or secretions or excretions, such as sweat, saliva, sputum, nasal mucus, vomit, urine, or feces, on them. Wear gloves when handling laundry from the patient. Read and follow directions on labels of laundry or clothing items and detergent. In general, wash and dry with the warmest temperatures recommended on the label.  Clean all areas the individual has used often Clean all touchable surfaces, such as counters, tabletops, doorknobs, bathroom fixtures, toilets, phones, keyboards, tablets, and bedside tables, every day. Also, clean any surfaces that may have blood, body fluids, and/or secretions or excretions on them. Wear gloves when cleaning  surfaces the patient has come in contact with. Use a diluted bleach solution (e.g., dilute bleach with 1 part bleach and 10 parts water) or a household disinfectant with a label that says EPA-registered for coronaviruses. To make a bleach solution at home, add 1 tablespoon of bleach to 1 quart (4 cups) of water. For a larger supply, add  cup of bleach to 1 gallon (16 cups) of water. Read labels of cleaning products and follow recommendations provided on product labels. Labels contain instructions for safe and effective use of the cleaning product including precautions you should take when applying the product, such as wearing gloves or eye protection and making sure you have good ventilation during use of the product. Remove gloves and wash hands immediately after cleaning.  Monitor yourself for signs and symptoms of illness Caregivers and household members are considered close contacts, should monitor their health, and will be asked to limit movement outside of the home to the extent possible. Follow the monitoring steps for close contacts listed on the symptom monitoring form.   ? If you have additional questions, contact your local health  department or call the epidemiologist on call at [phone] (available 24/7). ? This guidance is subject to change. For the most up-to-date guidance from [facility], please refer to their website: YouBlogs.pl

## 2019-11-07 NOTE — ED Notes (Signed)
Pt refused COVID Swab

## 2019-11-07 NOTE — ED Triage Notes (Signed)
Bilateral leg pain and swelling x 8 days. L flank pain x 4 days with vomiting today. C/o urinary urgency.

## 2019-11-07 NOTE — ED Notes (Signed)
ED Provider at bedside. 

## 2019-11-07 NOTE — ED Provider Notes (Signed)
[facility] EMERGENCY DEPARTMENT Provider Note   CSN: [id number] Arrival date & time: 11/07/19  1743    History Chief Complaint  Patient presents with  . Leg Swelling  . Flank Pain    Robin Frey is a 44 y.o. female with a history of asthma, hypercholesterolemia, obesity, sleep apnea, bipolar disorder, depression, & prior cholecystectomy, appendectomy & tubal ligation who presents to the ED with complaints of leg swelling x 8 days and flank pain x 4 days. Patient states that she has waxing/waning swelling to the bilateral lower extremities (R>L), swelling is associated with discomfort, worse when she is up and doing certain things, no alleviating factors. Reports she does have some intermittent dyspnea, having a hard time delineating if this is different from her usual dyspnea she gets with her asthma, she has however been coughing. States that about 4 days ago she started to have L flank pain, states it waxes/wanes in severity, radiates into the LLQ at times and has had some urinary urgency, today developed nausea with a few episodes of emesis. Denies fever, chills, hemoptysis, hematemesis, melena, diarrhea, dysuria, hematuria, vaginal bleeding, or vaginal discharge.  Denies concern for STI.   HPI     Past Medical History:  Diagnosis Date  . Asthma   . Bipolar disorder (HCC)   . Depression   . Dysrhythmia    pt states "one MD heard arrhythmia, others have not"  . GERD (gastroesophageal reflux disease)   . High cholesterol   . Neuropathy   . Obesity   . Pneumonia    multiple accounts  . Sleep apnea    nasal cannula at night    There are no problems to display for this patient.   Past Surgical History:  Procedure Laterality Date  . APPENDECTOMY    . 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 CHOLECYSTECTOMY N/A 11/26/2014   Procedure: LAPAROSCOPIC CHOLECYSTECTOMY WITH INTRAOPERATIVE CHOLANGIOGRAM;  Surgeon: Gaynelle Adu,  MD;  Location: [facility];  Service: General;  Laterality: N/A;  . EYE SURGERY     tear duct surgery as child  . TUBAL LIGATION       OB History   No obstetric history on file.     Family History  Problem Relation Age of Onset  . Diabetes Other   . Hyperlipidemia Other   . Hypertension Other   . Stroke Other   . Asthma Other   . Cancer Other     Social History   Tobacco Use  . Smoking status: Current Every Day Smoker    Packs/day: 1.00    Years: 14.00    Pack years: 14.00    Types: Cigarettes  . Smokeless tobacco: Never Used  Substance Use Topics  . Alcohol use: Not Currently    Comment: occasional  . Drug use: No    Home Medications Prior to Admission medications   Medication Sig Start Date End Date Taking? Authorizing Provider  albuterol (PROVENTIL HFA;VENTOLIN HFA) 108 (90 Base) MCG/ACT inhaler Inhale 1-2 puffs into the lungs every 6 (six) hours as needed for wheezing. 07/12/17   Rolland Porter, MD  Aspirin-Acetaminophen-Caffeine (GOODY HEADACHE PO) Take 2 packets by mouth 4 (four) times daily.    [provider]  dicyclomine (BENTYL) 20 MG tablet Take 1 tablet (20 mg total) by mouth 2 (two) times daily. 05/26/18   Aviva Kluver B, PA-C  fluticasone furoate-vilanterol (BREO ELLIPTA) 100-25 MCG/INH AEPB  Inhale 1 puff into the lungs every evening.    [provider]  gabapentin (NEURONTIN) 300 MG capsule Take 600 mg by mouth 3 (three) times daily. 04/03/18   [provider]  HYDROcodone-acetaminophen (NORCO/VICODIN) 5-325 MG tablet Take 1-2 tablets by mouth every 4 (four) hours as needed for severe pain. Patient not taking: Reported on 05/26/2018 04/30/18   Hayden Rasmussen, MD  ibuprofen (ADVIL,MOTRIN) 800 MG tablet Take 1 tablet (800 mg total) by mouth 3 (three) times daily. Patient not taking: Reported on 05/26/2018 09/27/17   Orpah Greek, MD  omeprazole (PRILOSEC) 20 MG capsule Take 1 capsule (20 mg total) by mouth daily. 05/26/18    Langston Masker B, PA-C  ondansetron (ZOFRAN ODT) 8 MG disintegrating tablet Take 1 tablet (8 mg total) by mouth every 8 (eight) hours as needed for nausea or vomiting. Patient not taking: Reported on 05/26/2018 03/18/18   Molpus, Jenny Reichmann, MD  ondansetron (ZOFRAN) 4 MG tablet Take 1 tablet (4 mg total) by mouth every 6 (six) hours. Patient not taking: Reported on 05/26/2018 04/30/18   Hayden Rasmussen, MD  sucralfate (CARAFATE) 1 g tablet Take 1 tablet (1 g total) by mouth 4 (four) times daily -  with meals and at bedtime for 10 days. 05/26/18 06/05/18  Langston Masker B, PA-C    Allergies    Cephalexin, Ciprofloxacin, Morphine and related, Trazodone and nefazodone, Ketorolac tromethamine, and Sulfamethoxazole-trimethoprim  Review of Systems   Review of Systems  Constitutional: Negative for chills and fever.  Respiratory: Positive for cough and shortness of breath (+/- if this is different from baseline).   Cardiovascular: Positive for leg swelling. Negative for chest pain.  Gastrointestinal: Positive for abdominal pain, nausea and vomiting. Negative for blood in stool, constipation and diarrhea.  Genitourinary: Positive for flank pain and urgency. Negative for dysuria, vaginal bleeding and vaginal discharge.  Musculoskeletal: Positive for myalgias.  Neurological: Negative for syncope, weakness and numbness.  All other systems reviewed and are negative.   Physical Exam Updated Vital Signs BP (!) 142/85 (BP Location: Left Arm)   Pulse 95   Temp 98.3 F (36.8 C) (Oral)   Resp 18   Ht 5\' 7"  (1.702 m)   Wt 127 kg   SpO2 97%   BMI 43.85 kg/m   Physical Exam Vitals and nursing note reviewed.  Constitutional:      General: She is not in acute distress.    Appearance: She is well-developed. She is obese. She is not toxic-appearing.  HENT:     Head: Normocephalic and atraumatic.  Eyes:     General:        Right eye: No discharge.        Left eye: No discharge.     Conjunctiva/sclera:  Conjunctivae normal.  Cardiovascular:     Rate and Rhythm: Normal rate and regular rhythm.     Comments: 2+ symmetric DP pulses. Pulmonary:     Effort: No respiratory distress.     Breath sounds: Wheezing (mild end expiratory) present. No rhonchi or rales.  Abdominal:     General: There is no distension.     Palpations: Abdomen is soft.     Tenderness: There is abdominal tenderness (LLQ). There is left CVA tenderness. There is no right CVA tenderness, guarding or rebound.  Musculoskeletal:     Cervical back: Neck supple. No rigidity.     Comments: Trace to 1+ symmetric pitting edema to bilateral lower extremities with diffuse tenderness to palpation to  the lower legs and feet. No point/focal bony tenderness. Compartments are soft.  No overlying erythema/warmth.  Skin:    General: Skin is warm and dry.     Findings: No rash.  Neurological:     Mental Status: She is alert.     Comments: Clear speech.  Sensation grossly intact bilateral lower extremities.  5 out of 5 symmetric strength with plantar dorsiflexion bilaterally.  Psychiatric:        Behavior: Behavior normal.    ED Results / Procedures / Treatments   Labs (all labs ordered are listed, but only abnormal results are displayed) Labs Reviewed  URINALYSIS, ROUTINE W REFLEX MICROSCOPIC - Abnormal; Notable for the following components:      Result Value   Specific Gravity, Urine >1.030 (*)    Hgb urine dipstick MODERATE (*)    All other components within normal limits  CBC - Abnormal; Notable for the following components:   WBC 22.1 (*)    All other components within normal limits  COMPREHENSIVE METABOLIC PANEL - Abnormal; Notable for the following components:   Calcium 8.5 (*)    All other components within normal limits  URINALYSIS, MICROSCOPIC (REFLEX) - Abnormal; Notable for the following components:   Bacteria, UA RARE (*)    All other components within normal limits  DIFFERENTIAL - Abnormal; Notable for the following  components:   Neutro Abs 17.3 (*)    Abs Immature Granulocytes 0.12 (*)    All other components within normal limits  URINE CULTURE  PREGNANCY, URINE  BRAIN NATRIURETIC PEPTIDE  TROPONIN I (HIGH SENSITIVITY)    EKG None  Radiology DG Chest 2 View  Result Date: 11/07/2019 CLINICAL DATA:  Lower extremity pain. EXAM: CHEST - 2 VIEW COMPARISON:  November 07, 2019 FINDINGS: Mildly prominent airspace opacities are again noted bilaterally. There are prominent interstitial lung markings. The heart size is mildly enlarged. There is no pneumothorax. No convincing large pleural effusion. There is no acute osseous abnormality. IMPRESSION: 1. Hazy bilateral attenuation of both lung fields is nonspecific and can be seen in patients with an atypical infectious process or developing pulmonary edema. 2. Mild cardiac enlargement. Electronically Signed   By: Katherine Mantle M.D.   On: 11/07/2019 20:03   CT Angio Chest PE W and/or Wo Contrast  Result Date: 11/07/2019 CLINICAL DATA:  Hypoxemia EXAM: CT ANGIOGRAPHY CHEST WITH CONTRAST TECHNIQUE: Multidetector CT imaging of the chest was performed using the standard protocol during bolus administration of intravenous contrast. Multiplanar CT image reconstructions and MIPs were obtained to evaluate the vascular anatomy. CONTRAST:  OMNIPAQUE IOHEXOL 350 MG/ML SOLN COMPARISON:  Chest x-ray 11/07/2019, CT chest 10/11/2017 FINDINGS: Cardiovascular: Satisfactory opacification of the pulmonary arteries to the segmental level. No evidence of pulmonary embolism. Nonaneurysmal aorta. No dissection is seen. Mild cardiomegaly. No pericardial effusion Mediastinum/Nodes: Prominent mediastinal lymph nodes. Prevascular node measures 13 mm. Right paratracheal lymph node measures 17 mm. Subcarinal node measures 19 mm. Midline trachea. No thyroid mass. Esophagus within normal limits Lungs/Pleura: No pleural effusion. Fairly extensive bilateral geographic ground-glass density. No  pleural effusion or pneumothorax. Upper Abdomen: No acute abnormality. Musculoskeletal: No chest wall abnormality. No acute or significant osseous findings. Review of the MIP images confirms the above findings. IMPRESSION: 1. Negative for acute pulmonary embolus or aortic dissection. 2. Mild cardiomegaly. Fairly extensive bilateral slightly geographic hazy and ground-glass density which may reflect pulmonary edema, bilateral pneumonia, or interstitial lung disease given its presence on multiple prior exams. 3. Mild mediastinal adenopathy  Electronically Signed   By: Jasmine Pang M.D.   On: 11/07/2019 22:36   US Venous Img Lower Bilateral  Result Date: 11/07/2019 CLINICAL DATA:  Bilateral lower extremity swelling for several days EXAM: BILATERAL LOWER EXTREMITY VENOUS DOPPLER ULTRASOUND TECHNIQUE: Gray-scale sonography with graded compression, as well as color Doppler and duplex ultrasound were performed to evaluate the lower extremity deep venous systems from the level of the common femoral vein and including the common femoral, femoral, profunda femoral, popliteal and calf veins including the posterior tibial, peroneal and gastrocnemius veins when visible. The superficial great saphenous vein was also interrogated. Spectral Doppler was utilized to evaluate flow at rest and with distal augmentation maneuvers in the common femoral, femoral and popliteal veins. COMPARISON:  None. FINDINGS: RIGHT LOWER EXTREMITY Common Femoral Vein: No evidence of thrombus. Normal compressibility, respiratory phasicity and response to augmentation. Saphenofemoral Junction: No evidence of thrombus. Normal compressibility and flow on color Doppler imaging. Profunda Femoral Vein: No evidence of thrombus. Normal compressibility and flow on color Doppler imaging. Femoral Vein: No evidence of thrombus. Normal compressibility, respiratory phasicity and response to augmentation. Popliteal Vein: No evidence of thrombus. Normal  compressibility, respiratory phasicity and response to augmentation. Calf Veins: No evidence of thrombus. Normal compressibility and flow on color Doppler imaging. Peroneal vein is not well visualized. Superficial Great Saphenous Vein: No evidence of thrombus. Normal compressibility. Venous Reflux:  None. Other Findings:  None. LEFT LOWER EXTREMITY Common Femoral Vein: No evidence of thrombus. Normal compressibility, respiratory phasicity and response to augmentation. Saphenofemoral Junction: No evidence of thrombus. Normal compressibility and flow on color Doppler imaging. Profunda Femoral Vein: No evidence of thrombus. Normal compressibility and flow on color Doppler imaging. Femoral Vein: No evidence of thrombus. Normal compressibility, respiratory phasicity and response to augmentation. Popliteal Vein: No evidence of thrombus. Normal compressibility, respiratory phasicity and response to augmentation. Calf Veins: No evidence of thrombus. Normal compressibility and flow on color Doppler imaging. Peroneal vein is not well visualized. Superficial Great Saphenous Vein: No evidence of thrombus. Normal compressibility. Venous Reflux:  None. Other Findings:  None. IMPRESSION: No evidence of deep venous thrombosis in either lower extremity. Electronically Signed   By: Alcide Clever M.D.   On: 11/07/2019 21:56   CT Renal Stone Study  Result Date: 11/07/2019 CLINICAL DATA:  Left flank pain with swelling for several days, initial encounter EXAM: CT ABDOMEN AND PELVIS WITHOUT CONTRAST TECHNIQUE: Multidetector CT imaging of the abdomen and pelvis was performed following the standard protocol without IV contrast. COMPARISON:  05/24/2018 FINDINGS: Lower chest: Lung bases demonstrate no focal infiltrate or sizable effusion. Hepatobiliary: No focal liver abnormality is seen. Status post cholecystectomy. No biliary dilatation. Pancreas: Unremarkable. No pancreatic ductal dilatation or surrounding inflammatory changes. Spleen:  Normal in size without focal abnormality. Adrenals/Urinary Tract: Adrenal glands are within normal limits bilaterally. Kidneys demonstrate a normal appearance. No renal calculi or obstructive changes are seen. Bladder is partially distended. Stomach/Bowel: The appendix has been surgically removed. No obstructive or inflammatory changes of the large or small bowel are seen. The stomach is decompressed. Vascular/Lymphatic: Aortic calcifications are noted. Diffuse vascular phleboliths are noted within the ovarian veins bilaterally. No significant lymphadenopathy is seen. Reproductive: Uterus and bilateral adnexa are unremarkable. Other: No abdominal wall hernia or abnormality. No abdominopelvic ascites. Musculoskeletal: Degenerative changes of lumbar spine are noted. IMPRESSION: No renal calculi or obstructive changes. Chronic changes similar to that noted on the prior exam. Electronically Signed   By: Alcide Clever M.D.   On:  11/07/2019 20:02    Procedures Procedures (including critical care time)  Medications Ordered in ED Medications - No data to display  ED Course  I have reviewed the triage vital signs and the nursing notes.  Pertinent labs & imaging results that were available during my care of the patient were reviewed by me and considered in my medical decision making (see chart for details).    MDM Rules/Calculators/A&P                     Patient presents to the ED with complaints of bilateral LE swelling and flank pain.  Patient is nontoxic, resting comfortably, vitals without significant abnormality.  Has mild end expiratory wheeze. LLQ tenderness to the abdomen with L CVA tenderness, no peritoneal signs.  Trace to 1+ symmetric pitting edema without superimposed infection.   CBC: Leukocytosis @ 22.1, has had leukocytosis around 14 in the past. No significant anemia.  CMP: Mild hypocalcemia- no significant electrolyte derangement. Renal function WNL. LFTs WNL UA: Hematuria,  dehydration, not consistent with UTI.  Preg test: Negative  --> obtain CXR & CT renal stone study  CXR: 1. Hazy bilateral attenuation of both lung fields is nonspecific and can be seen in patients with an atypical infectious process or developing pulmonary edema. 2. Mild cardiac enlargement--> EKG, BNP, troponin ordered.  CT renal stone study:  No renal calculi or obstructive changes. Chronic changes similar to that noted on the prior exam.   Discussed findings of plan of care with Dr. Stevie Kern- will add obtain bilateral venous duplex & CTA.   EKG: No STEMI Troponin: WNL BNP: WNL Bilateral venous duplex: Negative for DVT.  CTA:  1. Negative for acute pulmonary embolus or aortic dissection. 2. Mild cardiomegaly. Fairly extensive bilateral slightly geographic hazy and ground-glass density which may reflect pulmonary edema, bilateral pneumonia, or interstitial lung disease given its presence on multiple prior exams. 3. Mild mediastinal adenopathy   Discussed findings & plan of care with supervising physician Dr. Stevie Kern who has evaluated patient- recommends discharge home with abx for pneumonia, COVID testing, & close PCP follow up which I am in agreement with. Leukocytosis and cough raise concern for pneumonia. BNP WNL, however CHF remains on DDX. Will give abx, close PCP Follow up, may need echo and potential start of diuresis if not improvement with abx. Patient with intermittent desaturations- placed on 2L via [place] by nursing staff- this seems to be when she is sleeping or laying flat, history of sleep apnea & wears oxygen @ night- likely cause, when awake and talking upright SpO2 maintaining appropriately on RA. I personally ambulated patient throughout exam room SPO2 >95%. Appears appropriate for discharge at this time. Discussed abx- she has tolerated penicillins without difficulty previously- will give doxy & augmentin.  I discussed results, treatment plan, need for follow-up, and return  precautions with the patient. Provided opportunity for questions, patient confirmed understanding and is in agreement with plan.   PREZLEY QADIR was evaluated in Emergency Department on 11/07/2019 for the symptoms described in the history of present illness. He/she was evaluated in the context of the global COVID-19 pandemic, which necessitated consideration that the patient might be at risk for infection with the SARS-CoV-2 virus that causes COVID-19. Institutional protocols and algorithms that pertain to the evaluation of patients at risk for COVID-19 are in a state of rapid change based on information released by regulatory bodies including the CDC and federal and state organizations. These policies and algorithms were  followed during the patient's care in the ED.   Final Clinical Impression(s) / ED Diagnoses Final diagnoses:  Lower extremity edema  Leukocytosis, unspecified type  Hematuria, unspecified type  Community acquired pneumonia, unspecified laterality    Rx / DC Orders ED Discharge Orders         Ordered    amoxicillin-clavulanate (AUGMENTIN) 875-125 MG tablet  Every 12 hours     11/07/19 2255    doxycycline (VIBRAMYCIN) 100 MG capsule  2 times daily     11/07/19 2255    ondansetron (ZOFRAN ODT) 4 MG disintegrating tablet  Every 8 hours PRN     11/07/19 2255           Cherly Andersonetrucelli, Ege Muckey R, PA-C 11/07/19 2256    Milagros Lollykstra, Richard S, MD 11/09/19 0001

## 2019-11-09 LAB — URINE CULTURE: Culture: <10000 — AB

## 2020-07-14 ENCOUNTER — Other Ambulatory Visit: Payer: Self-pay

## 2020-07-14 ENCOUNTER — Emergency Department (HOSPITAL_BASED_OUTPATIENT_CLINIC_OR_DEPARTMENT_OTHER)
Admission: EM | Admit: 2020-07-14 | Discharge: 2020-07-15 | Disposition: A | Discharge status: Home / Self Care | Payer: Medicare Other | Attending: Emergency Medicine | Admitting: Emergency Medicine

## 2020-07-14 ENCOUNTER — Encounter (HOSPITAL_BASED_OUTPATIENT_CLINIC_OR_DEPARTMENT_OTHER): Payer: Self-pay | Admitting: *Deleted

## 2020-07-14 ENCOUNTER — Emergency Department (HOSPITAL_BASED_OUTPATIENT_CLINIC_OR_DEPARTMENT_OTHER): Payer: Medicare Other

## 2020-07-14 DIAGNOSIS — Z20822 Contact with and (suspected) exposure to covid-19: Secondary | ICD-10-CM | POA: Insufficient documentation

## 2020-07-14 DIAGNOSIS — R609 Edema, unspecified: Secondary | ICD-10-CM | POA: Diagnosis not present

## 2020-07-14 DIAGNOSIS — R0602 Shortness of breath: Secondary | ICD-10-CM | POA: Diagnosis present

## 2020-07-14 DIAGNOSIS — I110 Hypertensive heart disease with heart failure: Secondary | ICD-10-CM | POA: Diagnosis not present

## 2020-07-14 DIAGNOSIS — Z7951 Long term (current) use of inhaled steroids: Secondary | ICD-10-CM | POA: Diagnosis not present

## 2020-07-14 DIAGNOSIS — J189 Pneumonia, unspecified organism: Secondary | ICD-10-CM

## 2020-07-14 DIAGNOSIS — F17210 Nicotine dependence, cigarettes, uncomplicated: Secondary | ICD-10-CM | POA: Diagnosis not present

## 2020-07-14 DIAGNOSIS — Z7982 Long term (current) use of aspirin: Secondary | ICD-10-CM | POA: Insufficient documentation

## 2020-07-14 DIAGNOSIS — J441 Chronic obstructive pulmonary disease with (acute) exacerbation: Secondary | ICD-10-CM | POA: Diagnosis not present

## 2020-07-14 DIAGNOSIS — I509 Heart failure, unspecified: Secondary | ICD-10-CM | POA: Diagnosis not present

## 2020-07-14 DIAGNOSIS — J181 Lobar pneumonia, unspecified organism: Secondary | ICD-10-CM | POA: Insufficient documentation

## 2020-07-14 LAB — CBC WITH DIFFERENTIAL/PLATELET
Abs Immature Granulocytes: 0.04 10*3/uL (ref 0.00–0.07)
Basophils Absolute: 0.1 10*3/uL (ref 0.0–0.1)
Basophils Relative: 1 %
Eosinophils Absolute: 0.4 10*3/uL (ref 0.0–0.5)
Eosinophils Relative: 3 %
HCT: 40.4 % (ref 36.0–46.0)
Hemoglobin: 13.7 g/dL (ref 12.0–15.0)
Immature Granulocytes: 0 %
Lymphocytes Relative: 28 %
Lymphs Abs: 4.2 10*3/uL — ABNORMAL HIGH (ref 0.7–4.0)
MCH: 33.2 pg (ref 26.0–34.0)
MCHC: 33.9 g/dL (ref 30.0–36.0)
MCV: 97.8 fL (ref 80.0–100.0)
Monocytes Absolute: 0.8 10*3/uL (ref 0.1–1.0)
Monocytes Relative: 5 %
Neutro Abs: 9.5 10*3/uL — ABNORMAL HIGH (ref 1.7–7.7)
Neutrophils Relative %: 63 %
Platelets: 376 10*3/uL (ref 150–400)
RBC: 4.13 MIL/uL (ref 3.87–5.11)
RDW: 13.6 % (ref 11.5–15.5)
Smear Review: NORMAL
WBC Morphology: ABNORMAL
WBC: 15.1 10*3/uL — ABNORMAL HIGH (ref 4.0–10.5)
nRBC: 0 % (ref 0.0–0.2)

## 2020-07-14 LAB — RESP PANEL BY RT-PCR (FLU A&B, COVID) ARPGX2
Influenza A by PCR: NEGATIVE
Influenza B by PCR: NEGATIVE
SARS Coronavirus 2 by RT PCR: NEGATIVE

## 2020-07-14 LAB — BASIC METABOLIC PANEL
Anion gap: 7 (ref 5–15)
BUN: 17 mg/dL (ref 6–20)
CO2: 24 mmol/L (ref 22–32)
Calcium: 8.7 mg/dL — ABNORMAL LOW (ref 8.9–10.3)
Chloride: 108 mmol/L (ref 98–111)
Creatinine, Ser: 0.94 mg/dL (ref 0.44–1.00)
GFR, Estimated: >60 mL/min (ref >60)
Glucose, Bld: 92 mg/dL (ref 70–99)
Potassium: 3.9 mmol/L (ref 3.5–5.1)
Sodium: 139 mmol/L (ref 135–145)

## 2020-07-14 LAB — TROPONIN I (HIGH SENSITIVITY): Troponin I (High Sensitivity): 2 ng/L (ref <18)

## 2020-07-14 LAB — BRAIN NATRIURETIC PEPTIDE: B Natriuretic Peptide: 51.6 pg/mL (ref 0.0–100.0)

## 2020-07-14 MED ORDER — PREDNISONE 20 MG PO TABS: 60.0000 mg | ORAL_TABLET | Freq: Every day | ORAL | 0 refills | Status: AC

## 2020-07-14 MED ORDER — FUROSEMIDE 10 MG/ML IJ SOLN
60.0000 mg | Freq: Once | INTRAMUSCULAR | Status: AC
  Administered 2020-07-14: 60 mg via INTRAVENOUS
  Filled 2020-07-14: qty 6

## 2020-07-14 MED ORDER — GUAIFENESIN 100 MG/5ML PO SOLN
10.0000 mL | Freq: Once | ORAL | Status: AC
  Administered 2020-07-14: 200 mg via ORAL
  Filled 2020-07-14: qty 10

## 2020-07-14 MED ORDER — FUROSEMIDE 20 MG PO TABS: 60.0000 mg | ORAL_TABLET | Freq: Every day | ORAL | 0 refills | Status: AC

## 2020-07-14 MED ORDER — AZITHROMYCIN 250 MG PO TABS: 250.0000 mg | ORAL_TABLET | Freq: Every day | ORAL | 0 refills | Status: AC

## 2020-07-14 MED ORDER — PREDNISONE 50 MG PO TABS
60.0000 mg | ORAL_TABLET | Freq: Once | ORAL | Status: AC
  Administered 2020-07-14: 60 mg via ORAL
  Filled 2020-07-14: qty 1

## 2020-07-14 NOTE — ED Notes (Signed)
Ambulate around nurses station on room, on pulse ox. Pt was 94% before ambulate. Pt ranged from 92%  To 97 % during.

## 2020-07-14 NOTE — Discharge Instructions (Addendum)
I suspect your shortness of breath may be multifactorial, think your heart failure may be a bit worse after running out of your Lasix, I have represcribed these 3 would want you to continue taking them daily as prescribed.  Your chest x-ray shows what could be pulmonary edema or pneumonia, given your productive cough we will also treat you with a Z-Pak as well as some prednisone, continue using your inhalers as needed as well.  Please follow-up closely in the next few days with your primary care doctor to ensure that you are improving, if your symptoms are worsening.

## 2020-07-14 NOTE — ED Provider Notes (Signed)
[facility] EMERGENCY DEPARTMENT Provider Note   CSN: [id number] Arrival date & time: 07/14/20  1841     History Chief Complaint  Patient presents with  . Shortness of Breath    Robin Frey is a 44 y.o. female.  Robin Frey is a 44 y.o. female with history of CHF, sleep apnea, pneumonia, tobacco abuse, GERD, neuropathy, and obesity, who presents for evaluation of shortness of breath. Patient reports she has been getting more short of breath over the past 4 days. She reports it is worse with activity and improves with rest. She has had to sleep on her cough where she is sitting up more. Reports she has also had a cough that is sometimes productive. Some chest tightness when shortness of breath gets worse. She also reports some worsening swelling in both legs, no redness or pain. Reports she has gained some fluid weight over the past 4 days, but does not weigh herself daily.  Denies any associated fevers or chills.  No abdominal pain, vomiting or diarrhea.  No known sick contacts.  Has had her Covid vaccines.  Reports that she ran out of her Lasix about 7 days ago, typically takes 60 mg daily.  States she has had similar symptoms previously and also has a history of multiple episodes of pneumonia, states that they often end up treating her with steroids and antibiotics when she develops a cough like this.        Past Medical History:  Diagnosis Date  . Asthma   . Bipolar disorder (HCC)   . Depression   . Dysrhythmia    pt states "one MD heard arrhythmia, others have not"  . GERD (gastroesophageal reflux disease)   . High cholesterol   . Neuropathy   . Obesity   . Pneumonia    multiple accounts  . Sleep apnea    nasal cannula at night    There are no problems to display for this patient.   Past Surgical History:  Procedure Laterality Date  . APPENDECTOMY    . 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  CHOLECYSTECTOMY N/A 11/26/2014   Procedure: LAPAROSCOPIC CHOLECYSTECTOMY WITH INTRAOPERATIVE CHOLANGIOGRAM;  Surgeon: Gaynelle Adu, MD;  Location: [facility];  Service: General;  Laterality: N/A;  . EYE SURGERY     tear duct surgery as child  . TUBAL LIGATION       OB History   No obstetric history on file.     Family History  Problem Relation Age of Onset  . Diabetes Other   . Hyperlipidemia Other   . Hypertension Other   . Stroke Other   . Asthma Other   . Cancer Other     Social History   Tobacco Use  . Smoking status: Current Every Day Smoker    Packs/day: 1.00    Years: 14.00    Pack years: 14.00    Types: Cigarettes  . Smokeless tobacco: Never Used  Vaping Use  . Vaping Use: Never used  Substance Use Topics  . Alcohol use: Not Currently    Comment: occasional  . Drug use: No    Home Medications Prior to Admission medications   Medication Sig Start Date End Date Taking? Authorizing Provider  albuterol (PROVENTIL HFA;VENTOLIN HFA) 108 (90 Base) MCG/ACT inhaler Inhale 1-2 puffs into the lungs every 6 (six) hours as needed for wheezing. 07/12/17   Rolland Porter, MD  Aspirin-Acetaminophen-Caffeine (GOODY HEADACHE PO) Take 2 packets by mouth 4 (four) times daily.    [provider]  azithromycin (ZITHROMAX) 250 MG tablet Take 1 tablet (250 mg total) by mouth daily. Take first 2 tablets together, then 1 every day until finished. 07/14/20   Dartha Lodge, PA-C  dicyclomine (BENTYL) 20 MG tablet Take 1 tablet (20 mg total) by mouth 2 (two) times daily. 05/26/18   Aviva Kluver B, PA-C  fluticasone furoate-vilanterol (BREO ELLIPTA) 100-25 MCG/INH AEPB Inhale 1 puff into the lungs every evening.    [provider]  fluticasone furoate-vilanterol (BREO ELLIPTA) 100-25 MCG/INH AEPB Inhale into the lungs.    [provider]  furosemide (LASIX) 20 MG tablet Take 3 tablets (60 mg total) by mouth daily. 07/14/20 08/13/20  Dartha Lodge, PA-C  gabapentin  (NEURONTIN) 300 MG capsule Take 600 mg by mouth 3 (three) times daily. 04/03/18   [provider]  gabapentin (NEURONTIN) 300 MG capsule Take by mouth.    [provider]  HYDROcodone-acetaminophen (NORCO/VICODIN) 5-325 MG tablet Take 1-2 tablets by mouth every 4 (four) hours as needed for severe pain. Patient not taking: Reported on 05/26/2018 04/30/18   Terrilee Files, MD  omeprazole (PRILOSEC) 20 MG capsule Take 1 capsule (20 mg total) by mouth daily. 05/26/18   Aviva Kluver B, PA-C  ondansetron (ZOFRAN ODT) 4 MG disintegrating tablet Take 1 tablet (4 mg total) by mouth every 8 (eight) hours as needed for nausea or vomiting. 11/07/19   Petrucelli, Samantha R, PA-C  predniSONE (DELTASONE) 20 MG tablet Take 3 tablets (60 mg total) by mouth daily for 5 days. 07/14/20 07/19/20  Dartha Lodge, PA-C  sucralfate (CARAFATE) 1 g tablet Take 1 tablet (1 g total) by mouth 4 (four) times daily -  with meals and at bedtime for 10 days. 05/26/18 06/05/18  Aviva Kluver B, PA-C    Allergies    Cephalexin, Ciprofloxacin, Morphine and related, Trazodone and nefazodone, Ketorolac tromethamine, and Sulfamethoxazole-trimethoprim  Review of Systems   Review of Systems  Constitutional: Negative for chills and fever.  Respiratory: Positive for cough, chest tightness and shortness of breath.   Cardiovascular: Positive for leg swelling. Negative for chest pain and palpitations.  Gastrointestinal: Negative for abdominal pain, nausea and vomiting.  Genitourinary: Negative for dysuria and frequency.  Musculoskeletal: Negative for arthralgias and myalgias.  Skin: Negative for color change and rash.  Neurological: Negative for dizziness, syncope and light-headedness.  All other systems reviewed and are negative.   Physical Exam Updated Vital Signs BP 139/87   Pulse 87   Temp 98.6 F (37 C) (Oral)   Resp 18   Ht 5\' 7"  (1.702 m)   Wt 131.5 kg   LMP 06/30/2020   SpO2 98%   BMI 45.42 kg/m    Physical Exam Vitals and nursing note reviewed.  Constitutional:      General: She is not in acute distress.    Appearance: She is well-developed and well-nourished. She is obese. She is not diaphoretic.     Comments: Well-appearing and in no distress   HENT:     Head: Normocephalic and atraumatic.     Mouth/Throat:     Mouth: Oropharynx is clear and moist. Mucous membranes are moist.     Pharynx: Oropharynx is clear.  Eyes:     General:        Right eye: No discharge.        Left eye: No discharge.  Extraocular Movements: EOM normal.     Pupils: Pupils are equal, round, and reactive to light.  Cardiovascular:     Rate and Rhythm: Normal rate and regular rhythm.     Pulses: Intact distal pulses.     Heart sounds: Normal heart sounds. No murmur heard. No friction rub. No gallop.   Pulmonary:     Effort: Pulmonary effort is normal. No respiratory distress.     Breath sounds: Wheezing and rales present.     Comments: Respirations equal and unlabored, patient able to speak in full sentences, patient has some faint wheezing noted in the upper airways with some faint rales noted in bilateral bases Chest:     Chest wall: No tenderness.  Abdominal:     General: Bowel sounds are normal. There is no distension.     Palpations: Abdomen is soft. There is no mass.     Tenderness: There is no abdominal tenderness. There is no guarding.     Comments: Abdomen soft, nondistended, nontender to palpation in all quadrants without guarding or peritoneal signs   Musculoskeletal:        General: No deformity.     Cervical back: Neck supple.     Right lower leg: Edema present.     Left lower leg: Edema present.     Comments: 1+ pitting edema bilaterally up to the mid shins, no overlying erythema, 2+ distal pulses, warm and well perfused  Skin:    General: Skin is warm and dry.     Capillary Refill: Capillary refill takes less than 2 seconds.  Neurological:     Mental Status: She is alert.      Coordination: Coordination normal.     Comments: Speech is clear, able to follow commands Moves extremities without ataxia, coordination intact  Psychiatric:        Mood and Affect: Mood normal.        Behavior: Behavior normal.     ED Results / Procedures / Treatments   Labs (all labs ordered are listed, but only abnormal results are displayed) Labs Reviewed  BASIC METABOLIC PANEL - Abnormal; Notable for the following components:      Result Value   Calcium 8.7 (*)    All other components within normal limits  CBC WITH DIFFERENTIAL/PLATELET - Abnormal; Notable for the following components:   WBC 15.1 (*)    Neutro Abs 9.5 (*)    Lymphs Abs 4.2 (*)    All other components within normal limits  RESP PANEL BY RT-PCR (FLU A&B, COVID) ARPGX2  BRAIN NATRIURETIC PEPTIDE  TROPONIN I (HIGH SENSITIVITY)    EKG EKG Interpretation  Date/Time:  Tuesday July 14 2020 21:36:02 EST Ventricular Rate:  79 PR Interval:    QRS Duration: 100 QT Interval:  382 QTC Calculation: 438 R Axis:   -41 Text Interpretation: Sinus rhythm Left axis deviation Confirmed by Blane OharaZavitz, Joshua [phone]([id number]) on 07/15/2020 7:43:50 PM   Radiology DG Chest 2 View  Result Date: 07/14/2020 CLINICAL DATA:  Congestive heart failure EXAM: CHEST - 2 VIEW COMPARISON:  02/08/2020 FINDINGS: Normal cardiac silhouette. RIGHT lower lobe airspace disease. No pleural fluid. No pneumothorax. Low lung volumes. IMPRESSION: RIGHT lower lobe pneumonia versus edema. Electronically Signed   By: Genevive BiStewart  Edmunds M.D.   On: 07/14/2020 19:26    Procedures Procedures (including critical care time)  Medications Ordered in ED Medications  guaiFENesin (ROBITUSSIN) 100 MG/5ML solution 200 mg (200 mg Oral Given 07/14/20 2145)  furosemide (LASIX) injection 60 mg (  60 mg Intravenous Given 07/14/20 2318)  predniSONE (DELTASONE) tablet 60 mg (60 mg Oral Given 07/14/20 2316)    ED Course  I have reviewed the triage vital signs and the  nursing notes.  Pertinent labs & imaging results that were available during my care of the patient were reviewed by me and considered in my medical decision making (see chart for details).    MDM Rules/Calculators/A&P                          44 year old female presents with 4 days of worsening shortness of breath, the symptoms started after she recently ran out of her Lasix which she typically takes 60 mg daily, she has had some worsening swelling in her legs as well and feels that she is gaining some fluid weight.  She is also had a productive cough, has a history of tobacco abuse and sleep apnea and I suspect she has some underlying COPD as well.  States when she gets a cough like this she often has to be placed on steroids and antibiotics as well.  Will get basic labs, troponin, BNP and Covid and flu test, will also check chest x-ray and EKG.  Patient has some faint wheezing and faint rales on lung exam currently.  I have independently ordered, reviewed and interpreted all labs and imaging: CBC: Leukocytosis of 15.1 with left shift, normal hemoglobin BMP: Calcium of 8.7 but no other electrolyte derangements, normal renal function Troponin: Negative x2 BNP: Not significantly elevated at 51.6 Covid/flu: Negative  EKG shows sinus rhythm without concerning changes.  Chest x-ray: Right lower lobe pneumonia versus edema, no other active cardiopulmonary disease.   Patient ambulated in department and maintained O2 sats greater than 92% without significant increased work of breathing. Feel she is appropriate for outpatient treatment. Suspect shortness of breath is multifactorial, she has been off of her Lasix for 7 days after she ran out of this medication and I certainly suspect this is contributing, she has been given IV Lasix here in the emergency department and will refill her prescription for 60 mg of Lasix daily. Given chest x-ray findings concerning for possible edema or pneumonia with  productive cough and known history of smoking we will also treat with course of azithromycin and prednisone and patient has inhalers at home to use as needed. Not currently wheezing on exam. Patient expresses understanding and agreement with this plan. She will follow closely with her PCP. Strict return precautions provided. Discharged home in good condition.  Final Clinical Impression(s) / ED Diagnoses Final diagnoses:  Shortness of breath  Acute on chronic congestive heart failure, unspecified heart failure type (HCC)  Community acquired pneumonia of right lower lobe of lung  COPD exacerbation (HCC)    Rx / DC Orders ED Discharge Orders         Ordered    predniSONE (DELTASONE) 20 MG tablet  Daily        07/14/20 2355    azithromycin (ZITHROMAX) 250 MG tablet  Daily        07/14/20 2355    furosemide (LASIX) 20 MG tablet  Daily        07/14/20 2355           Dartha Lodge, PA-C 07/17/20 [id number]    Tilden Fossa, MD 07/19/20 [phone]

## 2020-07-14 NOTE — ED Triage Notes (Signed)
C/o SOB x 4 days  HX CHF

## 2021-03-19 ENCOUNTER — Other Ambulatory Visit: Payer: Self-pay

## 2021-03-19 ENCOUNTER — Emergency Department (HOSPITAL_BASED_OUTPATIENT_CLINIC_OR_DEPARTMENT_OTHER)
Admission: EM | Admit: 2021-03-19 | Discharge: 2021-03-19 | Disposition: A | Discharge status: Home / Self Care | Payer: Medicare Other | Attending: Emergency Medicine | Admitting: Emergency Medicine

## 2021-03-19 ENCOUNTER — Encounter (HOSPITAL_BASED_OUTPATIENT_CLINIC_OR_DEPARTMENT_OTHER): Payer: Self-pay | Admitting: Urology

## 2021-03-19 DIAGNOSIS — Z5321 Procedure and treatment not carried out due to patient leaving prior to being seen by health care provider: Secondary | ICD-10-CM | POA: Diagnosis not present

## 2021-03-19 DIAGNOSIS — R0781 Pleurodynia: Secondary | ICD-10-CM | POA: Diagnosis not present

## 2021-03-19 DIAGNOSIS — M545 Low back pain, unspecified: Secondary | ICD-10-CM | POA: Insufficient documentation

## 2021-03-19 DIAGNOSIS — R2233 Localized swelling, mass and lump, upper limb, bilateral: Secondary | ICD-10-CM | POA: Diagnosis not present

## 2021-03-19 DIAGNOSIS — R55 Syncope and collapse: Secondary | ICD-10-CM | POA: Diagnosis not present

## 2021-03-19 DIAGNOSIS — R2243 Localized swelling, mass and lump, lower limb, bilateral: Secondary | ICD-10-CM | POA: Insufficient documentation

## 2021-03-19 DIAGNOSIS — R42 Dizziness and giddiness: Secondary | ICD-10-CM | POA: Diagnosis not present

## 2021-03-19 NOTE — ED Triage Notes (Addendum)
Pt states 2 days ago started having lower back pain and swelling to legs and hands, states pain increased and today started having left rib pain.  Denies any fall or injury. States " 4 times today I had a feeling of pain in my lower back that radiated to my legs and I got dizzy and almost passed out".

## 2021-03-19 NOTE — ED Notes (Signed)
Pt left per registration 

## 2021-09-04 ENCOUNTER — Other Ambulatory Visit: Payer: Self-pay

## 2021-09-04 ENCOUNTER — Emergency Department (HOSPITAL_BASED_OUTPATIENT_CLINIC_OR_DEPARTMENT_OTHER): Payer: Medicare Other

## 2021-09-04 ENCOUNTER — Encounter (HOSPITAL_BASED_OUTPATIENT_CLINIC_OR_DEPARTMENT_OTHER): Payer: Self-pay | Admitting: Emergency Medicine

## 2021-09-04 ENCOUNTER — Emergency Department (HOSPITAL_COMMUNITY): Payer: Medicare Other

## 2021-09-04 ENCOUNTER — Emergency Department (HOSPITAL_BASED_OUTPATIENT_CLINIC_OR_DEPARTMENT_OTHER)
Admission: EM | Admit: 2021-09-04 | Discharge: 2021-09-04 | Disposition: A | Discharge status: Home / Self Care | Payer: Medicare Other | Attending: Emergency Medicine | Admitting: Emergency Medicine

## 2021-09-04 DIAGNOSIS — R0789 Other chest pain: Secondary | ICD-10-CM | POA: Insufficient documentation

## 2021-09-04 DIAGNOSIS — M546 Pain in thoracic spine: Secondary | ICD-10-CM | POA: Diagnosis not present

## 2021-09-04 DIAGNOSIS — N12 Tubulo-interstitial nephritis, not specified as acute or chronic: Secondary | ICD-10-CM

## 2021-09-04 DIAGNOSIS — R1012 Left upper quadrant pain: Secondary | ICD-10-CM | POA: Diagnosis present

## 2021-09-04 DIAGNOSIS — R000 Tachycardia, unspecified: Secondary | ICD-10-CM | POA: Diagnosis not present

## 2021-09-04 LAB — URINALYSIS, MICROSCOPIC (REFLEX)

## 2021-09-04 LAB — BASIC METABOLIC PANEL
Anion gap: 9 (ref 5–15)
BUN: 14 mg/dL (ref 6–20)
CO2: 24 mmol/L (ref 22–32)
Calcium: 9.1 mg/dL (ref 8.9–10.3)
Chloride: 100 mmol/L (ref 98–111)
Creatinine, Ser: 1.05 mg/dL — ABNORMAL HIGH (ref 0.44–1.00)
GFR, Estimated: >60 mL/min (ref >60)
Glucose, Bld: 126 mg/dL — ABNORMAL HIGH (ref 70–99)
Potassium: 3.5 mmol/L (ref 3.5–5.1)
Sodium: 133 mmol/L — ABNORMAL LOW (ref 135–145)

## 2021-09-04 LAB — LIPASE, BLOOD: Lipase: 28 U/L (ref 11–51)

## 2021-09-04 LAB — CBC
HCT: 41.9 % (ref 36.0–46.0)
Hemoglobin: 14.4 g/dL (ref 12.0–15.0)
MCH: 33.1 pg (ref 26.0–34.0)
MCHC: 34.4 g/dL (ref 30.0–36.0)
MCV: 96.3 fL (ref 80.0–100.0)
Platelets: 387 10*3/uL (ref 150–400)
RBC: 4.35 MIL/uL (ref 3.87–5.11)
RDW: 14.1 % (ref 11.5–15.5)
WBC: 18.7 10*3/uL — ABNORMAL HIGH (ref 4.0–10.5)
nRBC: 0 % (ref 0.0–0.2)

## 2021-09-04 LAB — URINALYSIS, ROUTINE W REFLEX MICROSCOPIC
Bilirubin Urine: NEGATIVE
Glucose, UA: NEGATIVE mg/dL
Ketones, ur: NEGATIVE mg/dL
Leukocytes,Ua: NEGATIVE
Nitrite: NEGATIVE
Protein, ur: NEGATIVE mg/dL
Specific Gravity, Urine: 1.02 (ref 1.005–1.030)
pH: 5.5 (ref 5.0–8.0)

## 2021-09-04 LAB — HEPATIC FUNCTION PANEL
ALT: 17 U/L (ref 0–44)
AST: 19 U/L (ref 15–41)
Albumin: 3.7 g/dL (ref 3.5–5.0)
Alkaline Phosphatase: 79 U/L (ref 38–126)
Bilirubin, Direct: <0.1 mg/dL (ref 0.0–0.2)
Total Bilirubin: 0.3 mg/dL (ref 0.3–1.2)
Total Protein: 7.2 g/dL (ref 6.5–8.1)

## 2021-09-04 LAB — PREGNANCY, URINE: Preg Test, Ur: NEGATIVE

## 2021-09-04 LAB — TROPONIN I (HIGH SENSITIVITY)
Troponin I (High Sensitivity): <2 ng/L (ref <18)
Troponin I (High Sensitivity): <2 ng/L (ref <18)

## 2021-09-04 MED ORDER — ONDANSETRON HCL 4 MG/2ML IJ SOLN
4.0000 mg | Freq: Once | INTRAMUSCULAR | Status: AC
  Administered 2021-09-04: 4 mg via INTRAVENOUS
  Filled 2021-09-04: qty 2

## 2021-09-04 MED ORDER — FENTANYL CITRATE PF 50 MCG/ML IJ SOSY
50.0000 ug | PREFILLED_SYRINGE | Freq: Once | INTRAMUSCULAR | Status: AC
  Administered 2021-09-04: 50 ug via INTRAVENOUS
  Filled 2021-09-04: qty 1

## 2021-09-04 MED ORDER — SODIUM CHLORIDE 0.9 % IV BOLUS: 500.0000 mL | Freq: Once | INTRAVENOUS | Status: DC

## 2021-09-04 MED ORDER — FENTANYL CITRATE PF 50 MCG/ML IJ SOSY: 50.0000 ug | PREFILLED_SYRINGE | Freq: Once | INTRAMUSCULAR | Status: DC

## 2021-09-04 MED ORDER — ONDANSETRON 4 MG PO TBDP: 4.0000 mg | ORAL_TABLET | Freq: Three times a day (TID) | ORAL | 0 refills | Status: DC | PRN

## 2021-09-04 MED ORDER — LEVOFLOXACIN 750 MG PO TABS
750.0000 mg | ORAL_TABLET | Freq: Once | ORAL | Status: AC
  Administered 2021-09-04: 750 mg via ORAL
  Filled 2021-09-04: qty 1

## 2021-09-04 MED ORDER — IOHEXOL 300 MG/ML  SOLN
100.0000 mL | Freq: Once | INTRAMUSCULAR | Status: AC | PRN
  Administered 2021-09-04: 100 mL via INTRAVENOUS

## 2021-09-04 MED ORDER — SODIUM CHLORIDE 0.9 % IV BOLUS
500.0000 mL | Freq: Once | INTRAVENOUS | Status: AC
Start: 2021-09-04 — End: 2021-09-04
  Administered 2021-09-04: 500 mL via INTRAVENOUS

## 2021-09-04 MED ORDER — LEVOFLOXACIN 750 MG PO TABS: 750.0000 mg | ORAL_TABLET | Freq: Every day | ORAL | 0 refills | Status: DC

## 2021-09-04 NOTE — ED Provider Notes (Signed)
[facility] EMERGENCY DEPARTMENT Provider Note   CSN: [id number] Arrival date & time: 09/04/21  1445     History  Chief Complaint  Patient presents with   Chest Pain    Robin Frey is a 46 y.o. female.  HPI 46 year old female presents with back pain, abdominal pain and now chest pain. She's been dealing with left sided thoracic back pain and LUQ/left sided abdominal pain for about 3 days. Vomited numerous times yesterday. No diarrhea. Some urgency and pressure with urinating, no dysuria/hematuria. No fevers. Today she has been having chest pain over her inferior sternum that radiates to the left axilla. Feels like a soreness. No dyspnea. Has a chronic cough, is unchanged. No weakness/numbness in legs. No incontinence.   Home Medications Prior to Admission medications   Medication Sig Start Date End Date Taking? Authorizing Provider  levofloxacin (LEVAQUIN) 750 MG tablet Take 1 tablet (750 mg total) by mouth daily. X 7 days 09/05/21  Yes Sherwood Gambler, MD  ondansetron (ZOFRAN-ODT) 4 MG disintegrating tablet Take 1 tablet (4 mg total) by mouth every 8 (eight) hours as needed for nausea or vomiting. 09/04/21  Yes Sherwood Gambler, MD  albuterol (PROVENTIL HFA;VENTOLIN HFA) 108 (90 Base) MCG/ACT inhaler Inhale 1-2 puffs into the lungs every 6 (six) hours as needed for wheezing. 07/12/17   Tanna Furry, MD  Aspirin-Acetaminophen-Caffeine (GOODY HEADACHE PO) Take 2 packets by mouth 4 (four) times daily.    [provider]  azithromycin (ZITHROMAX) 250 MG tablet Take 1 tablet (250 mg total) by mouth daily. Take first 2 tablets together, then 1 every day until finished. 07/14/20   Jacqlyn Larsen, PA-C  dicyclomine (BENTYL) 20 MG tablet Take 1 tablet (20 mg total) by mouth 2 (two) times daily. 05/26/18   Langston Masker B, PA-C  fluticasone furoate-vilanterol (BREO ELLIPTA) 100-25 MCG/INH AEPB Inhale 1 puff into the lungs every evening.    [provider]  fluticasone  furoate-vilanterol (BREO ELLIPTA) 100-25 MCG/INH AEPB Inhale into the lungs.    [provider]  furosemide (LASIX) 20 MG tablet Take 3 tablets (60 mg total) by mouth daily. 07/14/20 08/13/20  Jacqlyn Larsen, PA-C  gabapentin (NEURONTIN) 300 MG capsule Take 600 mg by mouth 3 (three) times daily. 04/03/18   [provider]  gabapentin (NEURONTIN) 300 MG capsule Take by mouth.    [provider]  HYDROcodone-acetaminophen (NORCO/VICODIN) 5-325 MG tablet Take 1-2 tablets by mouth every 4 (four) hours as needed for severe pain. Patient not taking: Reported on 05/26/2018 04/30/18   Hayden Rasmussen, MD  omeprazole (PRILOSEC) 20 MG capsule Take 1 capsule (20 mg total) by mouth daily. 05/26/18   Langston Masker B, PA-C  sucralfate (CARAFATE) 1 g tablet Take 1 tablet (1 g total) by mouth 4 (four) times daily -  with meals and at bedtime for 10 days. 05/26/18 06/05/18  Langston Masker B, PA-C      Allergies    Cephalexin, Ciprofloxacin, Morphine and related, Trazodone and nefazodone, Ketorolac tromethamine, and Sulfamethoxazole-trimethoprim    Review of Systems   Review of Systems  Constitutional:  Negative for fever.  Respiratory:  Positive for cough. Negative for shortness of breath.   Cardiovascular:  Positive for chest pain.  Gastrointestinal:  Positive for abdominal pain, nausea and vomiting. Negative for diarrhea.  Genitourinary:  Positive for urgency. Negative for dysuria.  Musculoskeletal:  Positive for back pain.  Neurological:  Negative for weakness and numbness.   Physical Exam Updated  Vital Signs BP 116/69    Pulse 70    Temp 98.2 F (36.8 C) (Oral)    Resp 16    Ht 5\' 7"  (1.702 m)    Wt 122.5 kg    LMP 10/07/2020 (Approximate)    SpO2 99%    BMI 42.29 kg/m  Physical Exam Vitals and nursing note reviewed.  Constitutional:      Appearance: She is well-developed. She is obese.  HENT:     Head: Normocephalic and atraumatic.  Cardiovascular:     Rate and Rhythm:  Normal rate and regular rhythm.     Heart sounds: Normal heart sounds.  Pulmonary:     Effort: Pulmonary effort is normal.     Breath sounds: Normal breath sounds.  Chest:     Chest wall: Tenderness (mild, inferior sternum) present.  Abdominal:     Palpations: Abdomen is soft.     Tenderness: There is abdominal tenderness (generalized, but worst in LUQ) in the left upper quadrant. There is left CVA tenderness. There is no right CVA tenderness.  Musculoskeletal:     Thoracic back: Tenderness present. No bony tenderness.     Lumbar back: No bony tenderness.  Skin:    General: Skin is warm and dry.  Neurological:     Mental Status: She is alert.     Comments: 5/5 strength in BLE. Grossly normal sensation    ED Results / Procedures / Treatments   Labs (all labs ordered are listed, but only abnormal results are displayed) Labs Reviewed  BASIC METABOLIC PANEL - Abnormal; Notable for the following components:      Result Value   Sodium 133 (*)    Glucose, Bld 126 (*)    Creatinine, Ser 1.05 (*)    All other components within normal limits  CBC - Abnormal; Notable for the following components:   WBC 18.7 (*)    All other components within normal limits  URINALYSIS, ROUTINE W REFLEX MICROSCOPIC - Abnormal; Notable for the following components:   Hgb urine dipstick LARGE (*)    All other components within normal limits  URINALYSIS, MICROSCOPIC (REFLEX) - Abnormal; Notable for the following components:   Bacteria, UA MANY (*)    All other components within normal limits  URINE CULTURE  PREGNANCY, URINE  HEPATIC FUNCTION PANEL  LIPASE, BLOOD  TROPONIN I (HIGH SENSITIVITY)  TROPONIN I (HIGH SENSITIVITY)    EKG EKG Interpretation  Date/Time:  Saturday September 04 2021 15:04:25 EST Ventricular Rate:  106 PR Interval:  130 QRS Duration: 88 QT Interval:  342 QTC Calculation: 454 R Axis:   -53 Text Interpretation: Sinus tachycardia Left anterior fascicular block  rate is faster,  but otherwise similar to Dec 2021 Confirmed by Sherwood Gambler [phone]) on 09/04/2021 3:37:45 PM  Radiology DG Chest 2 View  Result Date: 09/04/2021 CLINICAL DATA:  Worsening left-sided chest pain x2 weeks. EXAM: CHEST - 2 VIEW COMPARISON:  November 24, 2020 FINDINGS: The heart size and mediastinal contours are within normal limits. Perihilar predominant interstitial opacities suggesting reactive airways disease in the appropriate clinical setting. Hazy opacity in the left lung may reflect atelectasis or developing infiltrate. No visible pleural effusion or pneumothorax. The visualized skeletal structures are unremarkable. IMPRESSION: 1. Perihilar predominant interstitial opacities suggesting reactive airways disease in the appropriate clinical setting. 2. Hazy opacity in the left lung may reflect atelectasis or developing infiltrate. Electronically Signed   By: Dahlia Bailiff M.D.   On: 09/04/2021 15:56   CT ABDOMEN  PELVIS W CONTRAST  Result Date: 09/04/2021 CLINICAL DATA:  Flank pain, kidney stone suspected Nausea/vomiting LUQ abdominal pain EXAM: CT ABDOMEN AND PELVIS WITH CONTRAST TECHNIQUE: Multidetector CT imaging of the abdomen and pelvis was performed using the standard protocol following bolus administration of intravenous contrast. RADIATION DOSE REDUCTION: This exam was performed according to the departmental dose-optimization program which includes automated exposure control, adjustment of the mA and/or kV according to patient size and/or use of iterative reconstruction technique. CONTRAST:  148mL OMNIPAQUE IOHEXOL 300 MG/ML  SOLN COMPARISON:  November 07, 2019 FINDINGS: Lower chest: There is mosaic ground-glass attenuation of bilateral lower lobes, similar incomparison to prior and possibly reflecting air trapping or etiology such as hypersensitivity pneumonitis. Hepatobiliary: Status post cholecystectomy. Portal vein is patent. No intrahepatic or extrahepatic biliary ductal dilation. Focal fatty  deposition adjacent to the falciform ligament. Pancreas: Unremarkable. No pancreatic ductal dilatation or surrounding inflammatory changes. Spleen: Normal in size without focal abnormality. Adrenals/Urinary Tract: Adrenal glands are unremarkable. Kidneys enhance symmetrically. Punctate nonobstructive RIGHT-sided nephrolithiasis. No hydronephrosis. Bladder is unremarkable. Stomach/Bowel: Status post appendectomy. No evidence of bowel obstruction. Moderate colonic stool burden diffusely throughout the colon. Tiny hiatal hernia. Vascular/Lymphatic: Mild atherosclerotic calcifications of the aorta. No new suspicious lymphadenopathy. Reproductive: Uterus is present.  Adnexa are unremarkable. Other: No free air or free fluid. Musculoskeletal: Scattered sclerotic foci most consistent with bone islands. No acute osseous abnormality. IMPRESSION: 1. No suspicious CT etiology for acute abdominal pain identified. Moderate colonic stool burden diffusely throughout the colon. 2. Mosaic ground-glass attenuation of bilateral lung bases, similar comparison to multiple prior CTs. This may reflect hypersensitivity pneumonitis or air trapping. Dedicated nonemergent evaluation with ILD protocol CT is recommended if not previously performed. Aortic Atherosclerosis (ICD10-I70.0). Electronically Signed   By: Valentino Saxon M.D.   On: 09/04/2021 17:38    Procedures Procedures    Medications Ordered in ED Medications  fentaNYL (SUBLIMAZE) injection 50 mcg (has no administration in time range)  sodium chloride 0.9 % bolus 500 mL (has no administration in time range)  fentaNYL (SUBLIMAZE) injection 50 mcg (50 mcg Intravenous Given 09/04/21 1626)  ondansetron (ZOFRAN) injection 4 mg (4 mg Intravenous Given 09/04/21 1624)  iohexol (OMNIPAQUE) 300 MG/ML solution 100 mL (100 mLs Intravenous Contrast Given 09/04/21 1703)  sodium chloride 0.9 % bolus 500 mL (500 mLs Intravenous New Bag/Given 09/04/21 1743)  ondansetron (ZOFRAN)  injection 4 mg (4 mg Intravenous Given 09/04/21 1744)  levofloxacin (LEVAQUIN) tablet 750 mg (750 mg Oral Given 09/04/21 1823)    ED Course/ Medical Decision Making/ A&P                           Medical Decision Making Amount and/or Complexity of Data Reviewed Labs: ordered. Radiology: ordered.  Risk Prescription drug management.   Patient's primary issue appears to be the left flank-abdominal pain.  She is describing some pretty atypical chest pain.  She was transiently tachycardic when she first got here but none since and my suspicion is this is more from vomiting and I have a pretty low suspicion of PE and do not think this needs a work-up in the emergency department.  She does have some nonspecific urinary symptoms and while her UA does not show significant leukocytes there is many bacteria and with the flank pain I am concerned she might have a UTI/pyelonephritis.  She has a leukocytosis, which she seems to often have but it is a little worse today.  Kidney  function is very slightly worse and may indicate some mild dehydration.  Chest x-ray was personally reviewed and I do not see any obvious bacterial pneumonia.  CT images also reviewed and I see no obvious emergent findings.  ECG has been interpreted by myself and is benign besides some mild tachycardia.  Chart review shows she has diastolic CHF but with a preserved EF from care everywhere.  Here, she seems to be symptomatically little better though she has had some soft blood pressures, including 2 in the 80s and some in the 90s.  She does not seem to be symptomatic from this.  I gave her a small bolus of fluid and was gone to give her more but the further she has been in here is the more she wants to go home.  She states that she needs to go take care of her father.  I discussed my concern with her having low blood pressures and the potential need for observation versus admission.  She understands my concerns but does not want to be  admitted.  I do not think this is totally unreasonable but discussed that she is to have a low threshold to return and not take her blood pressure medicine unless her blood pressure starts going up again.  She has multiple allergies but states that despite her Cipro intolerance, she has tolerated Levaquin before.  Given the concern for possible pyelonephritis I will prescribe Levaquin.  We will also give Zofran.        Final Clinical Impression(s) / ED Diagnoses Final diagnoses:  Pyelonephritis    Rx / DC Orders ED Discharge Orders          Ordered    levofloxacin (LEVAQUIN) 750 MG tablet  Daily        09/04/21 1858    ondansetron (ZOFRAN-ODT) 4 MG disintegrating tablet  Every 8 hours PRN        09/04/21 1858              Sherwood Gambler, MD 09/04/21 1907

## 2021-09-04 NOTE — ED Notes (Signed)
BP cuff repositioned

## 2021-09-04 NOTE — ED Notes (Signed)
Pt at CT, will obtain 2nd troponin and pain score upon return to room

## 2021-09-04 NOTE — ED Triage Notes (Signed)
Pt arrives pov, ambulatory to triage with c/o lower back pain x 3 days and left side CP and neck pain today. Pt denies injury, denies increased shob, endorses nausea

## 2021-09-04 NOTE — Discharge Instructions (Addendum)
You are being treated for a kidney infection called pyelonephritis.  If you develop fever, vomiting, new or worsening pain, inability to keep medicines down, or any other new/concerning symptoms then return to the ER for evaluation.  Your blood pressure is running low/low normal in the emergency department and so I would advise he do not take your blood pressure medicines tonight if applicable and check your blood pressure before taking them tomorrow.  Call your doctor on Monday.

## 2021-09-06 LAB — URINE CULTURE

## 2021-10-04 ENCOUNTER — Encounter (HOSPITAL_COMMUNITY): Payer: Self-pay | Admitting: Radiology

## 2022-03-01 ENCOUNTER — Encounter (HOSPITAL_BASED_OUTPATIENT_CLINIC_OR_DEPARTMENT_OTHER): Payer: Self-pay | Admitting: Emergency Medicine

## 2022-03-01 ENCOUNTER — Other Ambulatory Visit: Payer: Self-pay

## 2022-03-01 ENCOUNTER — Emergency Department (HOSPITAL_BASED_OUTPATIENT_CLINIC_OR_DEPARTMENT_OTHER): Payer: 59

## 2022-03-01 ENCOUNTER — Emergency Department (HOSPITAL_BASED_OUTPATIENT_CLINIC_OR_DEPARTMENT_OTHER)
Admission: EM | Admit: 2022-03-01 | Discharge: 2022-03-01 | Disposition: A | Discharge status: Home / Self Care | Payer: 59 | Attending: Emergency Medicine | Admitting: Emergency Medicine

## 2022-03-01 DIAGNOSIS — M545 Low back pain, unspecified: Secondary | ICD-10-CM | POA: Diagnosis not present

## 2022-03-01 DIAGNOSIS — R109 Unspecified abdominal pain: Secondary | ICD-10-CM

## 2022-03-01 DIAGNOSIS — R112 Nausea with vomiting, unspecified: Secondary | ICD-10-CM | POA: Diagnosis not present

## 2022-03-01 DIAGNOSIS — Z7982 Long term (current) use of aspirin: Secondary | ICD-10-CM | POA: Diagnosis not present

## 2022-03-01 LAB — URINALYSIS, ROUTINE W REFLEX MICROSCOPIC
Bilirubin Urine: NEGATIVE
Glucose, UA: NEGATIVE mg/dL
Ketones, ur: NEGATIVE mg/dL
Leukocytes,Ua: NEGATIVE
Nitrite: NEGATIVE
Protein, ur: NEGATIVE mg/dL
Specific Gravity, Urine: 1.025 (ref 1.005–1.030)
pH: 5 (ref 5.0–8.0)

## 2022-03-01 LAB — URINALYSIS, MICROSCOPIC (REFLEX)

## 2022-03-01 LAB — CBC
HCT: 43.8 % (ref 36.0–46.0)
Hemoglobin: 14.9 g/dL (ref 12.0–15.0)
MCH: 33.3 pg (ref 26.0–34.0)
MCHC: 34 g/dL (ref 30.0–36.0)
MCV: 97.8 fL (ref 80.0–100.0)
Platelets: 394 10*3/uL (ref 150–400)
RBC: 4.48 MIL/uL (ref 3.87–5.11)
RDW: 14.6 % (ref 11.5–15.5)
WBC: 20.2 10*3/uL — ABNORMAL HIGH (ref 4.0–10.5)
nRBC: 0 % (ref 0.0–0.2)

## 2022-03-01 LAB — BASIC METABOLIC PANEL
Anion gap: 9 (ref 5–15)
BUN: 20 mg/dL (ref 6–20)
CO2: 24 mmol/L (ref 22–32)
Calcium: 9.4 mg/dL (ref 8.9–10.3)
Chloride: 107 mmol/L (ref 98–111)
Creatinine, Ser: 1.26 mg/dL — ABNORMAL HIGH (ref 0.44–1.00)
GFR, Estimated: 54 mL/min — ABNORMAL LOW (ref >60)
Glucose, Bld: 102 mg/dL — ABNORMAL HIGH (ref 70–99)
Potassium: 4.1 mmol/L (ref 3.5–5.1)
Sodium: 140 mmol/L (ref 135–145)

## 2022-03-01 LAB — PREGNANCY, URINE: Preg Test, Ur: NEGATIVE

## 2022-03-01 MED ORDER — OXYCODONE-ACETAMINOPHEN 5-325 MG PO TABS: 1.0000 | ORAL_TABLET | ORAL | Status: DC | PRN

## 2022-03-01 MED ORDER — ONDANSETRON 4 MG PO TBDP
4.0000 mg | ORAL_TABLET | Freq: Once | ORAL | Status: AC
  Administered 2022-03-01: 4 mg via ORAL
  Filled 2022-03-01: qty 1

## 2022-03-01 NOTE — ED Provider Notes (Addendum)
[facility] EMERGENCY DEPARTMENT Provider Note   CSN: [id number] Arrival date & time: 03/01/22  1310     History  Chief Complaint  Patient presents with   Flank Pain   Nausea    Robin Frey is a 46 y.o. female.  Patient is a 46 year old female who presents with pain in her right back.  It started earlier today.  Its been intermittent.  She had some nausea and vomiting associated with it.  She says its worse with certain movements and standing up but it does also radiate a little bit around to her right mid abdomen.  There is no pelvic pain.  She denies any change in her stools.  No urinary symptoms.  No fevers.  No history of similar symptoms in the past.  She got some Zofran in triage and is feeling better at this point.  Her pain is less than it was when she had come in.       Home Medications Prior to Admission medications   Medication Sig Start Date End Date Taking? Authorizing Provider  albuterol (PROVENTIL HFA;VENTOLIN HFA) 108 (90 Base) MCG/ACT inhaler Inhale 1-2 puffs into the lungs every 6 (six) hours as needed for wheezing. 07/12/17   Rolland Porter, MD  Aspirin-Acetaminophen-Caffeine (GOODY HEADACHE PO) Take 2 packets by mouth 4 (four) times daily.    [provider]  azithromycin (ZITHROMAX) 250 MG tablet Take 1 tablet (250 mg total) by mouth daily. Take first 2 tablets together, then 1 every day until finished. 07/14/20   Dartha Lodge, PA-C  dicyclomine (BENTYL) 20 MG tablet Take 1 tablet (20 mg total) by mouth 2 (two) times daily. 05/26/18   Aviva Kluver B, PA-C  fluticasone furoate-vilanterol (BREO ELLIPTA) 100-25 MCG/INH AEPB Inhale 1 puff into the lungs every evening.    [provider]  fluticasone furoate-vilanterol (BREO ELLIPTA) 100-25 MCG/INH AEPB Inhale into the lungs.    [provider]  furosemide (LASIX) 20 MG tablet Take 3 tablets (60 mg total) by mouth daily. 07/14/20 08/13/20  Dartha Lodge, PA-C  gabapentin  (NEURONTIN) 300 MG capsule Take 600 mg by mouth 3 (three) times daily. 04/03/18   [provider]  gabapentin (NEURONTIN) 300 MG capsule Take by mouth.    [provider]  HYDROcodone-acetaminophen (NORCO/VICODIN) 5-325 MG tablet Take 1-2 tablets by mouth every 4 (four) hours as needed for severe pain. Patient not taking: Reported on 05/26/2018 04/30/18   Terrilee Files, MD  levofloxacin (LEVAQUIN) 750 MG tablet Take 1 tablet (750 mg total) by mouth daily. X 7 days 09/05/21   Pricilla Loveless, MD  omeprazole (PRILOSEC) 20 MG capsule Take 1 capsule (20 mg total) by mouth daily. 05/26/18   Aviva Kluver B, PA-C  ondansetron (ZOFRAN-ODT) 4 MG disintegrating tablet Take 1 tablet (4 mg total) by mouth every 8 (eight) hours as needed for nausea or vomiting. 09/04/21   Pricilla Loveless, MD  sucralfate (CARAFATE) 1 g tablet Take 1 tablet (1 g total) by mouth 4 (four) times daily -  with meals and at bedtime for 10 days. 05/26/18 06/05/18  Aviva Kluver B, PA-C      Allergies    Cephalexin, Ciprofloxacin, Morphine and related, Trazodone and nefazodone, Ketorolac tromethamine, and Sulfamethoxazole-trimethoprim    Review of Systems   Review of Systems  Constitutional:  Negative for chills, diaphoresis, fatigue and fever.  HENT:  Negative for congestion, rhinorrhea and sneezing.   Eyes: Negative.   Respiratory:  Negative for cough,  chest tightness and shortness of breath.   Cardiovascular:  Negative for chest pain and leg swelling.  Gastrointestinal:  Positive for nausea and vomiting. Negative for abdominal pain, blood in stool and diarrhea.  Genitourinary:  Positive for flank pain. Negative for difficulty urinating, frequency and hematuria.  Musculoskeletal:  Positive for back pain. Negative for arthralgias.  Skin:  Negative for rash.  Neurological:  Negative for dizziness, speech difficulty, weakness, numbness and headaches.    Physical Exam Updated Vital Signs BP 94/68   Pulse  77   Temp 99 F (37.2 C) (Oral)   Resp 18   SpO2 91%  Physical Exam Constitutional:      Appearance: She is well-developed.  HENT:     Head: Normocephalic and atraumatic.  Eyes:     Pupils: Pupils are equal, round, and reactive to light.  Cardiovascular:     Rate and Rhythm: Normal rate and regular rhythm.     Heart sounds: Normal heart sounds.  Pulmonary:     Effort: Pulmonary effort is normal. No respiratory distress.     Breath sounds: Normal breath sounds. No wheezing or rales.  Chest:     Chest wall: No tenderness.  Abdominal:     General: Bowel sounds are normal.     Palpations: Abdomen is soft.     Tenderness: There is abdominal tenderness (Tenderness to the right mid back and the right mid flank/abdomen area.  There is no pain specifically over the gallbladder.  No pain in the right lower quadrant or pelvic area). There is no guarding or rebound.  Musculoskeletal:        General: Normal range of motion.     Cervical back: Normal range of motion and neck supple.     Comments: Positive tenderness in the right lumbar region, no spinal tenderness  Lymphadenopathy:     Cervical: No cervical adenopathy.  Skin:    General: Skin is warm and dry.     Findings: No rash.  Neurological:     Mental Status: She is alert and oriented to person, place, and time.     ED Results / Procedures / Treatments   Labs (all labs ordered are listed, but only abnormal results are displayed) Labs Reviewed  URINALYSIS, ROUTINE W REFLEX MICROSCOPIC - Abnormal; Notable for the following components:      Result Value   Hgb urine dipstick LARGE (*)    All other components within normal limits  BASIC METABOLIC PANEL - Abnormal; Notable for the following components:   Glucose, Bld 102 (*)    Creatinine, Ser 1.26 (*)    GFR, Estimated 54 (*)    All other components within normal limits  CBC - Abnormal; Notable for the following components:   WBC 20.2 (*)    All other components within normal  limits  URINALYSIS, MICROSCOPIC (REFLEX) - Abnormal; Notable for the following components:   Bacteria, UA RARE (*)    All other components within normal limits  PREGNANCY, URINE    EKG None  Radiology CT Renal Stone Study  Result Date: 03/01/2022 CLINICAL DATA:  Right flank pain, kidney stone suspected EXAM: CT ABDOMEN AND PELVIS WITHOUT CONTRAST TECHNIQUE: Multidetector CT imaging of the abdomen and pelvis was performed following the standard protocol without IV contrast. RADIATION DOSE REDUCTION: This exam was performed according to the departmental dose-optimization program which includes automated exposure control, adjustment of the mA and/or kV according to patient size and/or use of iterative reconstruction technique. COMPARISON:  CT  abdomen and pelvis 08/27/2021 FINDINGS: Lower chest: Redemonstrated mosaic attenuation of the lower lungs compatible air trapping hypersensitivity pneumonitis. Hepatobiliary: No focal liver abnormality is seen. Status post cholecystectomy. No biliary dilatation. Pancreas: Unremarkable. No pancreatic ductal dilatation or surrounding inflammatory changes. Spleen: Normal in size without focal abnormality. Adrenals/Urinary Tract: Tiny 1-2 mm nonobstructing calyceal stone in the lower pole of the right kidney. No additional urinary calculi. No hydronephrosis. Unremarkable adrenal glands. Bladder is unremarkable. Stomach/Bowel: Appendectomy. Stomach is within normal limits. Normal caliber large and small bowel. Moderate stool burden in the right colon. No bowel wall thickening or inflammatory changes. Vascular/Lymphatic: Mild aortic atherosclerotic calcification. No abdominal or pelvic lymphadenopathy. Reproductive: Uterus and bilateral adnexa are unremarkable. Other: No free intraperitoneal fluid or air. Musculoskeletal: Scattered sclerotic foci compatible with bone islands. Lower lumbar facet arthropathy. IMPRESSION: 1. No CT findings to account for abdominal pain. 2.  Mosaic ground-glass attenuation of the lung bases, similar to prior. This may represent hypersensitivity pneumonitis or air trapping 3.  Aortic Atherosclerosis (ICD10-I70.0). Electronically Signed   By: Minerva Fester M.D.   On: 03/01/2022 14:35    Procedures Procedures    Medications Ordered in ED Medications  oxyCODONE-acetaminophen (PERCOCET/ROXICET) 5-325 MG per tablet 1 tablet (has no administration in time range)  ondansetron (ZOFRAN-ODT) disintegrating tablet 4 mg (4 mg Oral Given 03/01/22 1325)    ED Course/ Medical Decision Making/ A&P                           Medical Decision Making Amount and/or Complexity of Data Reviewed Labs: ordered.  Risk Prescription drug management.   Patient is a 47 year old female who presents with pain in her right back.  It seems somewhat musculoskeletal although she has associated nausea and vomiting which is atypical for musculoskeletal pain.  It is also waxing and waning in intensity.  She has some pain in her right mid abdomen.  No specific concerns for gallbladder or pelvic etiology on exam.  Her labs show some hematuria.  No signs of infection.  On chart review, it looks like she has had some persistent hematuria.  She does not have any vaginal bleeding that she knows of currently.  She has not seen any blood in her urine.  She also has an elevated WBC count.  She appears to have a chronically elevated WBC count.  Etiology is unclear.  Overall she is feeling better in the ED.  I suspect this may be musculoskeletal versus possibly a passed kidney stone.  CT scan does not show any concerns for infection, obstruction or other acute abnormality.  I did discuss that I would like her to stay for p.o. trial but she does not want this.  She does not request anything for pain.  She says that she will take Tylenol for pain.  Her blood pressure is borderline low.  I offered to give her some IV fluids but she does not want to stay for this.  On chart review,  it looks like she has had some borderline low blood pressures on prior visits.  She had some wheezing on exam and she does not want to stay for any treatment of this.  Her oxygen saturation was borderline low at 91 to 92%.  She does have an appointment with her PCP on July 31.  I advised that she needs to follow-up on this and also needs to have some follow-up on her abnormal lab work including her WBC count,  persistent hematuria and mild elevation in her creatinine.  Return precautions were given.  Final Clinical Impression(s) / ED Diagnoses Final diagnoses:  Right flank pain    Rx / DC Orders ED Discharge Orders     None         Rolan Bucco, MD 03/01/22 1644    Rolan Bucco, MD 03/01/22 1644

## 2022-03-01 NOTE — ED Triage Notes (Signed)
Intermittent right sided flank pain that radiates to right lower abdomen x 1 days.  Endorses n/v, decreased urine output.  Denies diarrhea.

## 2022-03-01 NOTE — Discharge Instructions (Signed)
Follow-up with your primary care doctor on July 31 as scheduled.  He had some abnormalities in your blood work that need follow-up.  This includes an elevated creatinine, elevated WBC count and persistent hematuria.  Return to emergency room if you have any worsening symptoms.

## 2022-03-01 NOTE — ED Notes (Signed)
Discharge instructions reviewed with patient. Patient verbalizes understanding, no further questions at this time. Medications and follow up information provided. No acute distress noted at time of departure.  

## 2022-12-14 ENCOUNTER — Encounter (HOSPITAL_BASED_OUTPATIENT_CLINIC_OR_DEPARTMENT_OTHER): Payer: Self-pay | Admitting: Emergency Medicine

## 2022-12-14 ENCOUNTER — Emergency Department (HOSPITAL_BASED_OUTPATIENT_CLINIC_OR_DEPARTMENT_OTHER): Payer: 59

## 2022-12-14 ENCOUNTER — Emergency Department (HOSPITAL_BASED_OUTPATIENT_CLINIC_OR_DEPARTMENT_OTHER)
Admission: EM | Admit: 2022-12-14 | Discharge: 2022-12-14 | Discharge status: Against Medical Advice | Payer: 59 | Attending: Emergency Medicine | Admitting: Emergency Medicine

## 2022-12-14 ENCOUNTER — Other Ambulatory Visit: Payer: Self-pay

## 2022-12-14 DIAGNOSIS — Z7951 Long term (current) use of inhaled steroids: Secondary | ICD-10-CM | POA: Diagnosis not present

## 2022-12-14 DIAGNOSIS — R000 Tachycardia, unspecified: Secondary | ICD-10-CM | POA: Insufficient documentation

## 2022-12-14 DIAGNOSIS — R600 Localized edema: Secondary | ICD-10-CM | POA: Insufficient documentation

## 2022-12-14 DIAGNOSIS — Z7982 Long term (current) use of aspirin: Secondary | ICD-10-CM | POA: Diagnosis not present

## 2022-12-14 DIAGNOSIS — R7989 Other specified abnormal findings of blood chemistry: Secondary | ICD-10-CM | POA: Diagnosis not present

## 2022-12-14 DIAGNOSIS — D72829 Elevated white blood cell count, unspecified: Secondary | ICD-10-CM | POA: Insufficient documentation

## 2022-12-14 DIAGNOSIS — J9601 Acute respiratory failure with hypoxia: Secondary | ICD-10-CM | POA: Insufficient documentation

## 2022-12-14 DIAGNOSIS — Z5329 Procedure and treatment not carried out because of patient's decision for other reasons: Secondary | ICD-10-CM | POA: Insufficient documentation

## 2022-12-14 DIAGNOSIS — R0602 Shortness of breath: Secondary | ICD-10-CM | POA: Diagnosis present

## 2022-12-14 DIAGNOSIS — I509 Heart failure, unspecified: Secondary | ICD-10-CM | POA: Diagnosis not present

## 2022-12-14 DIAGNOSIS — J189 Pneumonia, unspecified organism: Secondary | ICD-10-CM | POA: Insufficient documentation

## 2022-12-14 DIAGNOSIS — J45901 Unspecified asthma with (acute) exacerbation: Secondary | ICD-10-CM | POA: Insufficient documentation

## 2022-12-14 LAB — BRAIN NATRIURETIC PEPTIDE: B Natriuretic Peptide: 97.3 pg/mL (ref 0.0–100.0)

## 2022-12-14 LAB — BASIC METABOLIC PANEL
Anion gap: 13 (ref 5–15)
BUN: 26 mg/dL — ABNORMAL HIGH (ref 6–20)
CO2: 17 mmol/L — ABNORMAL LOW (ref 22–32)
Calcium: 8.8 mg/dL — ABNORMAL LOW (ref 8.9–10.3)
Chloride: 108 mmol/L (ref 98–111)
Creatinine, Ser: 1.56 mg/dL — ABNORMAL HIGH (ref 0.44–1.00)
GFR, Estimated: 42 mL/min — ABNORMAL LOW (ref >60)
Glucose, Bld: 139 mg/dL — ABNORMAL HIGH (ref 70–99)
Potassium: 4 mmol/L (ref 3.5–5.1)
Sodium: 138 mmol/L (ref 135–145)

## 2022-12-14 LAB — CBC WITH DIFFERENTIAL/PLATELET
Abs Immature Granulocytes: 0.21 10*3/uL — ABNORMAL HIGH (ref 0.00–0.07)
Basophils Absolute: 0 10*3/uL (ref 0.0–0.1)
Basophils Relative: 0 %
Eosinophils Absolute: 0 10*3/uL (ref 0.0–0.5)
Eosinophils Relative: 0 %
HCT: 40.1 % (ref 36.0–46.0)
Hemoglobin: 13.7 g/dL (ref 12.0–15.0)
Immature Granulocytes: 1 %
Lymphocytes Relative: 7 %
Lymphs Abs: 1.4 10*3/uL (ref 0.7–4.0)
MCH: 33.7 pg (ref 26.0–34.0)
MCHC: 34.2 g/dL (ref 30.0–36.0)
MCV: 98.8 fL (ref 80.0–100.0)
Monocytes Absolute: 0.2 10*3/uL (ref 0.1–1.0)
Monocytes Relative: 1 %
Neutro Abs: 16.8 10*3/uL — ABNORMAL HIGH (ref 1.7–7.7)
Neutrophils Relative %: 91 %
Platelets: 407 10*3/uL — ABNORMAL HIGH (ref 150–400)
RBC: 4.06 MIL/uL (ref 3.87–5.11)
RDW: 14.6 % (ref 11.5–15.5)
WBC: 18.6 10*3/uL — ABNORMAL HIGH (ref 4.0–10.5)
nRBC: 0 % (ref 0.0–0.2)

## 2022-12-14 LAB — LACTIC ACID, PLASMA: Lactic Acid, Venous: 2 mmol/L (ref 0.5–1.9)

## 2022-12-14 MED ORDER — CEFDINIR 300 MG PO CAPS: 300.0000 mg | ORAL_CAPSULE | Freq: Two times a day (BID) | ORAL | 0 refills | Status: AC

## 2022-12-14 MED ORDER — SODIUM CHLORIDE 0.9 % IV SOLN
1.0000 g | Freq: Once | INTRAVENOUS | Status: AC
  Administered 2022-12-14: 1 g via INTRAVENOUS
  Filled 2022-12-14: qty 10

## 2022-12-14 MED ORDER — IPRATROPIUM-ALBUTEROL 0.5-2.5 (3) MG/3ML IN SOLN
RESPIRATORY_TRACT | Status: AC
  Administered 2022-12-14: 6 mL
  Filled 2022-12-14: qty 6

## 2022-12-14 MED ORDER — IPRATROPIUM-ALBUTEROL 0.5-2.5 (3) MG/3ML IN SOLN: 3.0000 mL | Freq: Once | RESPIRATORY_TRACT | Status: DC

## 2022-12-14 NOTE — ED Notes (Signed)
RT ambulated with pt with pulse ox. Pt's O2 saturation dropped to 82% even though she appeared comfortable. RT connected pt to the O2 monitor once back in her room and O2 saturation was 79%. MD and Pt are aware.

## 2022-12-14 NOTE — ED Notes (Signed)
Patient placed on 2L [place]. Patient states she is supposed to be on oxygen at night when sleeping and states she is falling asleep.

## 2022-12-14 NOTE — ED Triage Notes (Signed)
Patient arrives ambulatory by POV c/o cough and shortness of breath about a week ago. Was seen at PCP and given steroid shot and started on zpack. Reports hx of CHF. Reports taking her lasix without as much output over the past 5 days. Has been doing breathing treatments every 4 hours at home with last treatment at 9am.

## 2022-12-14 NOTE — Discharge Instructions (Signed)
You have pneumonia today and I am concerned that this is making your asthma exacerbation and overall breathing worse.  Ideally we would admit you to the hospital for IV antibiotics and continued breathing treatments.  As you have decided to go home, I have sent in an additional antibiotic for you called cefdinir.  This helps to treat pneumonia in addition to azithromycin.  Please use your home albuterol nebulizer every 4 hours.  Continue the prednisone for asthma flare as well.  It is very important that you follow-up with your doctor in the next 24 to 48 hours for recheck.  Also, if you feel worse or change your mind about being admitted to the hospital, please come back to the emergency department at any time so that we may help you.

## 2022-12-14 NOTE — ED Provider Notes (Signed)
[place] [facility]PARTMENT AT [facility] Provider Note   CSN: [id number] Arrival date & time: 12/14/22  1041     History  Chief Complaint  Patient presents with   Shortness of Breath    Robin Frey is a 47 y.o. female.  Patient with history of asthma, CHF, use of supplemental oxygen at night, GERD, bipolar, recurrent pneumonia --presents to the emergency department today for evaluation of cough, wheezing, shortness of breath.  Patient's symptoms started about 1 week ago.  She went and saw her doctor on 12/12/2022.  She was treated for asthma exacerbation and was given IM Kenalog, started on prednisone, azithromycin and penicillin.  She states that she was unable to tolerate the penicillin due to GI upset.  Her symptoms were persistent and worsening.  She has been taking her diuretic but does not feel like she has been urinating as much as usual.  She also does not feel like her home breathing treatments have been as effective.  She denies lower extremity swelling that is worsening.  No chest pain or shortness of breath.  Frequent cough noted.       Home Medications Prior to Admission medications   Medication Sig Start Date End Date Taking? Authorizing Provider  albuterol (PROVENTIL HFA;VENTOLIN HFA) 108 (90 Base) MCG/ACT inhaler Inhale 1-2 puffs into the lungs every 6 (six) hours as needed for wheezing. 07/12/17   Rolland Porter, MD  Aspirin-Acetaminophen-Caffeine (GOODY HEADACHE PO) Take 2 packets by mouth 4 (four) times daily.    [provider]  azithromycin (ZITHROMAX) 250 MG tablet Take 1 tablet (250 mg total) by mouth daily. Take first 2 tablets together, then 1 every day until finished. 07/14/20   Dartha Lodge, PA-C  dicyclomine (BENTYL) 20 MG tablet Take 1 tablet (20 mg total) by mouth 2 (two) times daily. 05/26/18   Aviva Kluver B, PA-C  fluticasone furoate-vilanterol (BREO ELLIPTA) 100-25 MCG/INH AEPB Inhale 1 puff into the lungs every evening.     [provider]  fluticasone furoate-vilanterol (BREO ELLIPTA) 100-25 MCG/INH AEPB Inhale into the lungs.    [provider]  furosemide (LASIX) 20 MG tablet Take 3 tablets (60 mg total) by mouth daily. 07/14/20 08/13/20  Dartha Lodge, PA-C  gabapentin (NEURONTIN) 300 MG capsule Take 600 mg by mouth 3 (three) times daily. 04/03/18   [provider]  gabapentin (NEURONTIN) 300 MG capsule Take by mouth.    [provider]  HYDROcodone-acetaminophen (NORCO/VICODIN) 5-325 MG tablet Take 1-2 tablets by mouth every 4 (four) hours as needed for severe pain. Patient not taking: Reported on 05/26/2018 04/30/18   Terrilee Files, MD  levofloxacin (LEVAQUIN) 750 MG tablet Take 1 tablet (750 mg total) by mouth daily. X 7 days 09/05/21   Pricilla Loveless, MD  omeprazole (PRILOSEC) 20 MG capsule Take 1 capsule (20 mg total) by mouth daily. 05/26/18   Aviva Kluver B, PA-C  ondansetron (ZOFRAN-ODT) 4 MG disintegrating tablet Take 1 tablet (4 mg total) by mouth every 8 (eight) hours as needed for nausea or vomiting. 09/04/21   Pricilla Loveless, MD  sucralfate (CARAFATE) 1 g tablet Take 1 tablet (1 g total) by mouth 4 (four) times daily -  with meals and at bedtime for 10 days. 05/26/18 06/05/18  Aviva Kluver B, PA-C      Allergies    Cephalexin, Ciprofloxacin, Morphine and related, Trazodone and nefazodone, Ketorolac tromethamine, and Sulfamethoxazole-trimethoprim    Review of Systems   Review of Systems  Physical Exam Updated Vital Signs BP 112/69 (BP Location: Left Arm)   Pulse (!) 111   Temp 98.6 F (37 C) (Oral)   Resp (!) 24   Ht 5\' 7"  (1.702 m)   Wt 117.5 kg   SpO2 97%   BMI 40.57 kg/m   Physical Exam Vitals and nursing note reviewed.  Constitutional:      General: She is not in acute distress.    Appearance: She is well-developed.  HENT:     Head: Normocephalic and atraumatic.     Right Ear: External ear normal.     Left Ear: External ear normal.      Nose: Nose normal.  Eyes:     Conjunctiva/sclera: Conjunctivae normal.  Cardiovascular:     Rate and Rhythm: Regular rhythm. Tachycardia present.     Heart sounds: No murmur heard. Pulmonary:     Effort: Tachypnea present. No respiratory distress.     Breath sounds: Wheezing present. No decreased breath sounds, rhonchi or rales.     Comments: Mild tachypnea, however patient is able to converse normally. Abdominal:     Palpations: Abdomen is soft.     Tenderness: There is no abdominal tenderness. There is no guarding or rebound.  Musculoskeletal:     Cervical back: Normal range of motion and neck supple.     Right lower leg: Edema present.     Left lower leg: Edema present.     Comments: Trace edema bilaterally  Skin:    General: Skin is warm and dry.     Findings: No rash.  Neurological:     General: No focal deficit present.     Mental Status: She is alert. Mental status is at baseline.     Motor: No weakness.  Psychiatric:        Mood and Affect: Mood normal.     ED Results / Procedures / Treatments   Labs (all labs ordered are listed, but only abnormal results are displayed) Labs Reviewed  CBC WITH DIFFERENTIAL/PLATELET - Abnormal; Notable for the following components:      Result Value   WBC 18.6 (*)    Platelets 407 (*)    Neutro Abs 16.8 (*)    Abs Immature Granulocytes 0.21 (*)    All other components within normal limits  BASIC METABOLIC PANEL - Abnormal; Notable for the following components:   CO2 17 (*)    Glucose, Bld 139 (*)    BUN 26 (*)    Creatinine, Ser 1.56 (*)    Calcium 8.8 (*)    GFR, Estimated 42 (*)    All other components within normal limits  LACTIC ACID, PLASMA - Abnormal; Notable for the following components:   Lactic Acid, Venous 2.0 (*)    All other components within normal limits  BRAIN NATRIURETIC PEPTIDE  LACTIC ACID, PLASMA    EKG EKG Interpretation  Date/Time:  Wednesday Dec 14 2022 10:53:43 EDT Ventricular Rate:  101 PR  Interval:  128 QRS Duration: 97 QT Interval:  314 QTC Calculation: 407 R Axis:   -54 Text Interpretation: Sinus tachycardia Left anterior fascicular block Abnormal R-wave progression, late transition Confirmed by Alvino Blood ([id number]) on 12/14/2022 11:26:27 AM  Radiology DG Chest 2 View  Result Date: 12/14/2022 CLINICAL DATA:  One-week history of cough and shortness of breath EXAM: CHEST - 2 VIEW COMPARISON:  Chest radiograph dated 09/04/2021 FINDINGS: Normal lung volumes. Bibasilar patchy opacities. No pleural effusion or pneumothorax. The heart size and mediastinal contours  are within normal limits. No acute osseous abnormality. IMPRESSION: Bibasilar patchy opacities, suspicious for multifocal aspiration/pneumonia. Electronically Signed   By: Agustin Cree M.D.   On: 12/14/2022 11:19    Procedures Procedures    Medications Ordered in ED Medications  ipratropium-albuterol (DUONEB) 0.5-2.5 (3) MG/3ML nebulizer solution 3 mL (3 mLs Nebulization Not Given 12/14/22 1153)  cefTRIAXone (ROCEPHIN) 1 g in sodium chloride 0.9 % 100 mL IVPB (has no administration in time range)  ipratropium-albuterol (DUONEB) 0.5-2.5 (3) MG/3ML nebulizer solution (6 mLs  Given 12/14/22 1055)    ED Course/ Medical Decision Making/ A&P    Patient seen and examined. History obtained directly from patient. Work-up including labs, imaging, EKG ordered in triage, if performed, were reviewed.    Labs/EKG: Independently reviewed and interpreted.  This included: CBC with elevated white blood cell count 18.6, likely in part related to steroids but also pneumonia; BMP creatinine slightly elevated from baseline at 1.56 with BUN of 26, glucose 139 with normal anion gap normal electrolytes; lactate 2.0; BNP normal at 97.  Imaging: Independently visualized and interpreted.  This included: Chest x-ray, agree signs of basilar pneumonia  Medications/Fluids: Ordered: IV Rocephin, albuterol/Atrovent   Most recent vital signs reviewed  and are as follows: BP 112/69 (BP Location: Left Arm)   Pulse (!) 111   Temp 98.6 F (37 C) (Oral)   Resp (!) 24   Ht 5\' 7"  (1.702 m)   Wt 117.5 kg   SpO2 97%   BMI 40.57 kg/m   Initial impression: Patient with pneumonia exacerbating asthma, stable CHF.  I discussed with patient that ideally she would be admitted to the hospital due to worsening symptoms and pneumonia in setting of asthma, not improved with outpatient treatment thus far.  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  She states that she knows that she should come into the hospital but is not sure that she will be able to due to these other responsibilities.  I will check back after she gets IV antibiotics.  Will also attempt to ambulate her and see how she does with walking.  1:59 PM Reassessment performed. Patient appears stable.  No increased work of breathing or respiratory distress.  She became hypoxic with walking but looks fairly comfortable per RT.  She has minimal wheezing at current time.  Oxygen saturation on 1 L O2 is 93-95%.  I had another discussion with the patient.  I recommended admission to the hospital.  Again reiterated that I was concerned about her breathing and pneumonia, not improved with antibiotics at this point, low oxygen saturations with walking, and poorly controlled asthma.  And she also again reiterates that she has family responsibilities and cannot stay in the hospital at this time.  Discussed that she could get worse, have worsening breathing difficulties, respiratory failure, possibly death or disability from worsening condition.  This was also voiced to her by several other staff members including nursing and RT.  Given this, I will call in a prescription for cefdinir to take in addition to azithromycin.  I recommend that she continue prednisone and every 4 hours breathing treatments at home for asthma control.  I encouraged her to see her doctor in 24  to 48 hours for recheck and encouraged her to return at any time if she feels worse or if she changes her mind or has assistance with family care.  Most current vital signs reviewed and are as follows: BP Marland Kitchen)  93/57   Pulse 83   Temp 98.6 F (37 C) (Oral)   Resp (!) 26   Ht 5\' 7"  (1.702 m)   Wt 117.5 kg   SpO2 (!) 88% Comment: standing  BMI 40.57 kg/m   Plan: Discharge to home.   Prescriptions written for: Cefdinir  ED return instructions discussed: Return with worsening shortness of breath, worsening trouble breathing, high fevers, persistent vomiting and cannot keep down medications, or if she generally feels worse or changes her mind about inpatient treatment.  Follow-up instructions discussed: Patient encouraged to follow-up with their PCP in 1-2 days.                             Medical Decision Making Amount and/or Complexity of Data Reviewed Labs: ordered. Radiology: ordered.  Risk Prescription drug management.   Patient with acute on chronic respiratory failure with pneumonia.  Asthma and breathing symptoms remain poorly controlled despite outpatient treatment.  Patient hypoxic today.  Recommended admission to hospital however patient elects to leave AGAINST MEDICAL ADVICE as discussed above.  Low concern overall for PE, ACS.  Considered sepsis however no fever and lactate is minimally elevated at 2.0.  Patient is conversant and oriented.  She states obligations caring for other family members as reason she cannot come in the hospital today.  She seems to be of sound mind and can make medical decisions on her behalf.  Capacity is not an issue currently.  Return instructions discussed as above.  Follow-up discussions as discussed as above.  Patient will continue antibiotics and treatment of asthma at home.        Final Clinical Impression(s) / ED Diagnoses Final diagnoses:  Community acquired pneumonia, unspecified laterality  Acute respiratory failure with hypoxia  (HCC)  Exacerbation of asthma, unspecified asthma severity, unspecified whether persistent    Rx / DC Orders ED Discharge Orders     None         Renne Crigler, PA-C 12/14/22 1404    Lonell Grandchild, MD 12/15/22 [phone]

## 2023-02-01 ENCOUNTER — Emergency Department (HOSPITAL_BASED_OUTPATIENT_CLINIC_OR_DEPARTMENT_OTHER): Payer: 59

## 2023-02-01 ENCOUNTER — Encounter (HOSPITAL_BASED_OUTPATIENT_CLINIC_OR_DEPARTMENT_OTHER): Payer: Self-pay

## 2023-02-01 ENCOUNTER — Other Ambulatory Visit: Payer: Self-pay

## 2023-02-01 ENCOUNTER — Emergency Department (HOSPITAL_BASED_OUTPATIENT_CLINIC_OR_DEPARTMENT_OTHER)
Admission: EM | Admit: 2023-02-01 | Discharge: 2023-02-01 | Disposition: A | Discharge status: Home / Self Care | Payer: 59 | Attending: Emergency Medicine | Admitting: Emergency Medicine

## 2023-02-01 DIAGNOSIS — Z7951 Long term (current) use of inhaled steroids: Secondary | ICD-10-CM | POA: Diagnosis not present

## 2023-02-01 DIAGNOSIS — M545 Low back pain, unspecified: Secondary | ICD-10-CM | POA: Insufficient documentation

## 2023-02-01 DIAGNOSIS — J45909 Unspecified asthma, uncomplicated: Secondary | ICD-10-CM | POA: Insufficient documentation

## 2023-02-01 DIAGNOSIS — R109 Unspecified abdominal pain: Secondary | ICD-10-CM | POA: Diagnosis not present

## 2023-02-01 LAB — CBC WITH DIFFERENTIAL/PLATELET
Abs Immature Granulocytes: 0.05 10*3/uL (ref 0.00–0.07)
Basophils Absolute: 0.1 10*3/uL (ref 0.0–0.1)
Basophils Relative: 1 %
Eosinophils Absolute: 0.3 10*3/uL (ref 0.0–0.5)
Eosinophils Relative: 3 %
HCT: 43.2 % (ref 36.0–46.0)
Hemoglobin: 14.4 g/dL (ref 12.0–15.0)
Immature Granulocytes: 0 %
Lymphocytes Relative: 26 %
Lymphs Abs: 3.4 10*3/uL (ref 0.7–4.0)
MCH: 33.3 pg (ref 26.0–34.0)
MCHC: 33.3 g/dL (ref 30.0–36.0)
MCV: 99.8 fL (ref 80.0–100.0)
Monocytes Absolute: 0.6 10*3/uL (ref 0.1–1.0)
Monocytes Relative: 5 %
Neutro Abs: 8.6 10*3/uL — ABNORMAL HIGH (ref 1.7–7.7)
Neutrophils Relative %: 65 %
Platelets: 380 10*3/uL (ref 150–400)
RBC: 4.33 MIL/uL (ref 3.87–5.11)
RDW: 14.4 % (ref 11.5–15.5)
WBC: 13.1 10*3/uL — ABNORMAL HIGH (ref 4.0–10.5)
nRBC: 0 % (ref 0.0–0.2)

## 2023-02-01 LAB — COMPREHENSIVE METABOLIC PANEL
ALT: 14 U/L (ref 0–44)
AST: 12 U/L — ABNORMAL LOW (ref 15–41)
Albumin: 3.5 g/dL (ref 3.5–5.0)
Alkaline Phosphatase: 77 U/L (ref 38–126)
Anion gap: 6 (ref 5–15)
BUN: 18 mg/dL (ref 6–20)
CO2: 22 mmol/L (ref 22–32)
Calcium: 8.4 mg/dL — ABNORMAL LOW (ref 8.9–10.3)
Chloride: 108 mmol/L (ref 98–111)
Creatinine, Ser: 1.11 mg/dL — ABNORMAL HIGH (ref 0.44–1.00)
GFR, Estimated: >60 mL/min (ref >60)
Glucose, Bld: 83 mg/dL (ref 70–99)
Potassium: 3.9 mmol/L (ref 3.5–5.1)
Sodium: 136 mmol/L (ref 135–145)
Total Bilirubin: 0.2 mg/dL — ABNORMAL LOW (ref 0.3–1.2)
Total Protein: 7.2 g/dL (ref 6.5–8.1)

## 2023-02-01 LAB — URINALYSIS, MICROSCOPIC (REFLEX)

## 2023-02-01 LAB — URINALYSIS, ROUTINE W REFLEX MICROSCOPIC
Bilirubin Urine: NEGATIVE
Glucose, UA: NEGATIVE mg/dL
Ketones, ur: NEGATIVE mg/dL
Leukocytes,Ua: NEGATIVE
Nitrite: NEGATIVE
Protein, ur: NEGATIVE mg/dL
Specific Gravity, Urine: 1.025 (ref 1.005–1.030)
pH: 5.5 (ref 5.0–8.0)

## 2023-02-01 LAB — PREGNANCY, URINE: Preg Test, Ur: NEGATIVE

## 2023-02-01 LAB — LIPASE, BLOOD: Lipase: 31 U/L (ref 11–51)

## 2023-02-01 MED ORDER — FENTANYL CITRATE PF 50 MCG/ML IJ SOSY
50.0000 ug | PREFILLED_SYRINGE | Freq: Once | INTRAMUSCULAR | Status: AC
  Administered 2023-02-01: 50 ug via INTRAVENOUS
  Filled 2023-02-01: qty 1

## 2023-02-01 MED ORDER — METHYLPREDNISOLONE 4 MG PO TBPK: ORAL_TABLET | ORAL | 0 refills | Status: AC

## 2023-02-01 MED ORDER — ONDANSETRON 4 MG PO TBDP: 4.0000 mg | ORAL_TABLET | Freq: Three times a day (TID) | ORAL | 0 refills | Status: AC | PRN

## 2023-02-01 MED ORDER — SODIUM CHLORIDE 0.9 % IV BOLUS
1000.0000 mL | Freq: Once | INTRAVENOUS | Status: AC
  Administered 2023-02-01: 1000 mL via INTRAVENOUS

## 2023-02-01 MED ORDER — ONDANSETRON HCL 4 MG/2ML IJ SOLN
4.0000 mg | Freq: Once | INTRAMUSCULAR | Status: AC
  Administered 2023-02-01: 4 mg via INTRAVENOUS
  Filled 2023-02-01: qty 2

## 2023-02-01 MED ORDER — CYCLOBENZAPRINE HCL 10 MG PO TABS: 10.0000 mg | ORAL_TABLET | Freq: Two times a day (BID) | ORAL | 0 refills | Status: AC | PRN

## 2023-02-01 NOTE — Discharge Instructions (Signed)
Recommend 1000 mg of Tylenol every 6 hours as needed for pain.  Take Medrol Dosepak as prescribed.  I prescribed you muscle relaxant as well called Flexeril.  This medication is sedating.  Do not use if you doing dangerous activities including driving.  Follow-up with your primary care doctor.

## 2023-02-01 NOTE — ED Provider Notes (Signed)
[place] [facility] Provider Note   CSN: [id number] Arrival date & time: 02/01/23  1125     History  Chief Complaint  Patient presents with   Back Pain    Robin Frey is a 47 y.o. female.  Patient here with low back pain.  But also having some nausea and vomiting today may be pain with urination.  Sometimes will have shooting pain down her legs but none currently.  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  History of bipolar, asthma.  No history of kidney stones.  Denies any chest pain or shortness of breath.  The history is provided by the patient.       Home Medications Prior to Admission medications   Medication Sig Start Date End Date Taking? Authorizing Provider  cyclobenzaprine (FLEXERIL) 10 MG tablet Take 1 tablet (10 mg total) by mouth 2 (two) times daily as needed for muscle spasms. 02/01/23  Yes Pebbles Zeiders, DO  methylPREDNISolone (MEDROL DOSEPAK) 4 MG TBPK tablet Follow package insert 02/01/23  Yes Jye Fariss, DO  albuterol (PROVENTIL HFA;VENTOLIN HFA) 108 (90 Base) MCG/ACT inhaler Inhale 1-2 puffs into the lungs every 6 (six) hours as needed for wheezing. 07/12/17   Rolland Porter, MD  Aspirin-Acetaminophen-Caffeine (GOODY HEADACHE PO) Take 2 packets by mouth 4 (four) times daily.    [provider]  azithromycin (ZITHROMAX) 250 MG tablet Take 1 tablet (250 mg total) by mouth daily. Take first 2 tablets together, then 1 every day until finished. 07/14/20   Dartha Lodge, PA-C  dicyclomine (BENTYL) 20 MG tablet Take 1 tablet (20 mg total) by mouth 2 (two) times daily. 05/26/18   Aviva Kluver B, PA-C  fluticasone furoate-vilanterol (BREO ELLIPTA) 100-25 MCG/INH AEPB Inhale 1 puff into the lungs every evening.    [provider]  fluticasone furoate-vilanterol (BREO ELLIPTA) 100-25 MCG/INH AEPB Inhale into the lungs.    [provider]  furosemide (LASIX) 20 MG tablet Take  3 tablets (60 mg total) by mouth daily. 07/14/20 08/13/20  Dartha Lodge, PA-C  gabapentin (NEURONTIN) 300 MG capsule Take 600 mg by mouth 3 (three) times daily. 04/03/18   [provider]  gabapentin (NEURONTIN) 300 MG capsule Take by mouth.    [provider]  HYDROcodone-acetaminophen (NORCO/VICODIN) 5-325 MG tablet Take 1-2 tablets by mouth every 4 (four) hours as needed for severe pain. Patient not taking: Reported on 05/26/2018 04/30/18   Terrilee Files, MD  omeprazole (PRILOSEC) 20 MG capsule Take 1 capsule (20 mg total) by mouth daily. 05/26/18   Aviva Kluver B, PA-C  ondansetron (ZOFRAN-ODT) 4 MG disintegrating tablet Take 1 tablet (4 mg total) by mouth every 8 (eight) hours as needed for nausea or vomiting. 09/04/21   Pricilla Loveless, MD  sucralfate (CARAFATE) 1 g tablet Take 1 tablet (1 g total) by mouth 4 (four) times daily -  with meals and at bedtime for 10 days. 05/26/18 06/05/18  Aviva Kluver B, PA-C      Allergies    Cephalexin, Ciprofloxacin, Morphine and codeine, Trazodone and nefazodone, Ketorolac tromethamine, and Sulfamethoxazole-trimethoprim    Review of Systems   Review of Systems  Physical Exam Updated Vital Signs BP 126/80 (BP Location: Right Wrist)   Pulse 94   Temp 98.2 F (36.8 C) (Oral)   Resp 18   Ht 5\' 7"  (1.702 m)   Wt 119.3 kg   SpO2 95%   BMI 41.19  kg/m  Physical Exam Vitals and nursing note reviewed.  Constitutional:      General: She is not in acute distress.    Appearance: She is well-developed. She is not ill-appearing.  HENT:     Head: Normocephalic and atraumatic.  Eyes:     Extraocular Movements: Extraocular movements intact.     Conjunctiva/sclera: Conjunctivae normal.     Pupils: Pupils are equal, round, and reactive to light.  Cardiovascular:     Rate and Rhythm: Normal rate and regular rhythm.     Pulses: Normal pulses.     Heart sounds: Normal heart sounds. No murmur heard. Pulmonary:     Effort: Pulmonary  effort is normal. No respiratory distress.     Breath sounds: Normal breath sounds.  Abdominal:     Palpations: Abdomen is soft.     Tenderness: There is abdominal tenderness.  Musculoskeletal:        General: Tenderness present. No swelling.     Cervical back: Normal range of motion and neck supple.     Comments: Tenderness to the paraspinal lumbar muscles bilaterally  Skin:    General: Skin is warm and dry.     Capillary Refill: Capillary refill takes less than 2 seconds.  Neurological:     General: No focal deficit present.     Mental Status: She is alert.     Sensory: No sensory deficit.     Motor: No weakness.  Psychiatric:        Mood and Affect: Mood normal.     ED Results / Procedures / Treatments   Labs (all labs ordered are listed, but only abnormal results are displayed) Labs Reviewed  CBC WITH DIFFERENTIAL/PLATELET - Abnormal; Notable for the following components:      Result Value   WBC 13.1 (*)    Neutro Abs 8.6 (*)    All other components within normal limits  COMPREHENSIVE METABOLIC PANEL - Abnormal; Notable for the following components:   Creatinine, Ser 1.11 (*)    Calcium 8.4 (*)    AST 12 (*)    Total Bilirubin 0.2 (*)    All other components within normal limits  URINALYSIS, ROUTINE W REFLEX MICROSCOPIC - Abnormal; Notable for the following components:   Hgb urine dipstick MODERATE (*)    All other components within normal limits  URINALYSIS, MICROSCOPIC (REFLEX) - Abnormal; Notable for the following components:   Bacteria, UA RARE (*)    All other components within normal limits  PREGNANCY, URINE  LIPASE, BLOOD    EKG None  Radiology CT Renal Stone Study  Result Date: 02/01/2023 CLINICAL DATA:  Flank pain.  Bilateral flank pain for 9 days EXAM: CT ABDOMEN AND PELVIS WITHOUT CONTRAST TECHNIQUE: Multidetector CT imaging of the abdomen and pelvis was performed following the standard protocol without IV contrast. RADIATION DOSE REDUCTION: This  exam was performed according to the departmental dose-optimization program which includes automated exposure control, adjustment of the mA and/or kV according to patient size and/or use of iterative reconstruction technique. COMPARISON:  CT 11/07/2019 FINDINGS: Lower chest: Mild atelectasis lung bases. Hepatobiliary: No focal hepatic lesion. Postcholecystectomy. No biliary dilatation. Pancreas: Pancreas is normal. No ductal dilatation. No pancreatic inflammation. Spleen: Normal spleen Adrenals/urinary tract: Adrenal glands normal. No nephrolithiasis ureterolithiasis. No obstructive uropathy. No bladder calculi. Stomach/Bowel: The stomach, duodenum, and small bowel normal. The colon and rectosigmoid colon are normal. Vascular/Lymphatic: Abdominal aorta is normal caliber. No periportal or retroperitoneal adenopathy. No pelvic adenopathy. Reproductive: Uterus and adnexa  unremarkable. Other: No free fluid. Musculoskeletal: Densely sclerotic small lesion in the LEFT iliac bone measuring 7 mm on image 60 is unchanged from prior CT scan (11/07/2019). IMPRESSION: 1. No nephrolithiasis, ureterolithiasis, or obstructive uropathy. 2. No acute findings in the abdomen pelvis. 3. Postcholecystectomy. Electronically Signed   By: Genevive Bi M.D.   On: 02/01/2023 12:51    Procedures Procedures    Medications Ordered in ED Medications  ondansetron (ZOFRAN) injection 4 mg (4 mg Intravenous Given 02/01/23 1200)  sodium chloride 0.9 % bolus 1,000 mL (1,000 mLs Intravenous New Bag/Given 02/01/23 1155)  fentaNYL (SUBLIMAZE) injection 50 mcg (50 mcg Intravenous Given 02/01/23 1159)    ED Course/ Medical Decision Making/ A&P                             Medical Decision Making Amount and/or Complexity of Data Reviewed Labs: ordered. Radiology: ordered.  Risk Prescription drug management.   Clatie A Kishi is here with low back pain and abdominal pain and nausea vomiting.  Normal vitals.  No fever.  History of  reflux, asthma, bipolar.  Differential diagnosis likely muscle skeletal process with may be UTI, GI process or viral illness, seems less likely to be diverticulitis or bowel obstruction or kidney stone.  Have no concern for cauda equina.  Will get CBC, CMP, lipase, urinalysis, CT scan abdomen pelvis.  Will give IV fluids and IV fentanyl and reevaluate.  Per my review and interpretation of labs no significant anemia or electrolyte abnormality or kidney injury.  CT scans unremarkable per radiology report.  Urinalysis negative for infection.  Overall suspect muscular process.  Discharged in good condition.  Understands return precautions.  This chart was dictated using voice recognition software.  Despite best efforts to proofread,  errors can occur which can change the documentation meaning.         Final Clinical Impression(s) / ED Diagnoses Final diagnoses:  Acute low back pain, unspecified back pain laterality, unspecified whether sciatica present    Rx / DC Orders ED Discharge Orders          Ordered    methylPREDNISolone (MEDROL DOSEPAK) 4 MG TBPK tablet        02/01/23 1255    cyclobenzaprine (FLEXERIL) 10 MG tablet  2 times daily PRN        02/01/23 1255              Izel Eisenhardt, DO 02/01/23 1256

## 2023-02-01 NOTE — ED Triage Notes (Addendum)
C/o lumbar back pain x 9 days. States pain has been worsening. Pain now radiated into bilateral legs. Also c/o discomfort with urination. Also having vomiting since last night.

## 2023-02-20 ENCOUNTER — Emergency Department (HOSPITAL_BASED_OUTPATIENT_CLINIC_OR_DEPARTMENT_OTHER): Payer: 59

## 2023-02-20 ENCOUNTER — Emergency Department (HOSPITAL_BASED_OUTPATIENT_CLINIC_OR_DEPARTMENT_OTHER)
Admission: EM | Admit: 2023-02-20 | Discharge: 2023-02-20 | Discharge status: Against Medical Advice | Payer: 59 | Attending: Emergency Medicine | Admitting: Emergency Medicine

## 2023-02-20 ENCOUNTER — Other Ambulatory Visit: Payer: Self-pay

## 2023-02-20 ENCOUNTER — Encounter (HOSPITAL_BASED_OUTPATIENT_CLINIC_OR_DEPARTMENT_OTHER): Payer: Self-pay

## 2023-02-20 DIAGNOSIS — I700 Atherosclerosis of aorta: Secondary | ICD-10-CM | POA: Diagnosis not present

## 2023-02-20 DIAGNOSIS — N200 Calculus of kidney: Secondary | ICD-10-CM | POA: Diagnosis not present

## 2023-02-20 DIAGNOSIS — Z7982 Long term (current) use of aspirin: Secondary | ICD-10-CM | POA: Diagnosis not present

## 2023-02-20 DIAGNOSIS — Z7951 Long term (current) use of inhaled steroids: Secondary | ICD-10-CM | POA: Diagnosis not present

## 2023-02-20 DIAGNOSIS — R0902 Hypoxemia: Secondary | ICD-10-CM | POA: Insufficient documentation

## 2023-02-20 DIAGNOSIS — J984 Other disorders of lung: Secondary | ICD-10-CM | POA: Insufficient documentation

## 2023-02-20 DIAGNOSIS — Z1152 Encounter for screening for COVID-19: Secondary | ICD-10-CM | POA: Insufficient documentation

## 2023-02-20 DIAGNOSIS — R062 Wheezing: Secondary | ICD-10-CM | POA: Diagnosis present

## 2023-02-20 DIAGNOSIS — R300 Dysuria: Secondary | ICD-10-CM | POA: Insufficient documentation

## 2023-02-20 DIAGNOSIS — J45901 Unspecified asthma with (acute) exacerbation: Secondary | ICD-10-CM | POA: Diagnosis not present

## 2023-02-20 DIAGNOSIS — M545 Low back pain, unspecified: Secondary | ICD-10-CM | POA: Diagnosis not present

## 2023-02-20 DIAGNOSIS — Z5329 Procedure and treatment not carried out because of patient's decision for other reasons: Secondary | ICD-10-CM | POA: Diagnosis not present

## 2023-02-20 DIAGNOSIS — M47816 Spondylosis without myelopathy or radiculopathy, lumbar region: Secondary | ICD-10-CM | POA: Diagnosis not present

## 2023-02-20 LAB — CBC WITH DIFFERENTIAL/PLATELET
Abs Immature Granulocytes: 0.03 10*3/uL (ref 0.00–0.07)
Basophils Absolute: 0.1 10*3/uL (ref 0.0–0.1)
Basophils Relative: 1 %
Eosinophils Absolute: 0.2 10*3/uL (ref 0.0–0.5)
Eosinophils Relative: 3 %
HCT: 42.4 % (ref 36.0–46.0)
Hemoglobin: 14.3 g/dL (ref 12.0–15.0)
Immature Granulocytes: 0 %
Lymphocytes Relative: 32 %
Lymphs Abs: 3 10*3/uL (ref 0.7–4.0)
MCH: 33.6 pg (ref 26.0–34.0)
MCHC: 33.7 g/dL (ref 30.0–36.0)
MCV: 99.8 fL (ref 80.0–100.0)
Monocytes Absolute: 0.6 10*3/uL (ref 0.1–1.0)
Monocytes Relative: 7 %
Neutro Abs: 5.4 10*3/uL (ref 1.7–7.7)
Neutrophils Relative %: 57 %
Platelets: 358 10*3/uL (ref 150–400)
RBC: 4.25 MIL/uL (ref 3.87–5.11)
RDW: 14.3 % (ref 11.5–15.5)
WBC: 9.4 10*3/uL (ref 4.0–10.5)
nRBC: 0 % (ref 0.0–0.2)

## 2023-02-20 LAB — URINALYSIS, ROUTINE W REFLEX MICROSCOPIC
Bilirubin Urine: NEGATIVE
Glucose, UA: NEGATIVE mg/dL
Ketones, ur: NEGATIVE mg/dL
Leukocytes,Ua: NEGATIVE
Nitrite: NEGATIVE
Protein, ur: 30 mg/dL — AB
Specific Gravity, Urine: >=1.03 (ref 1.005–1.030)
pH: 5.5 (ref 5.0–8.0)

## 2023-02-20 LAB — URINALYSIS, MICROSCOPIC (REFLEX)

## 2023-02-20 LAB — COMPREHENSIVE METABOLIC PANEL
ALT: 13 U/L (ref 0–44)
AST: 13 U/L — ABNORMAL LOW (ref 15–41)
Albumin: 3.6 g/dL (ref 3.5–5.0)
Alkaline Phosphatase: 62 U/L (ref 38–126)
Anion gap: 10 (ref 5–15)
BUN: 16 mg/dL (ref 6–20)
CO2: 24 mmol/L (ref 22–32)
Calcium: 8.7 mg/dL — ABNORMAL LOW (ref 8.9–10.3)
Chloride: 104 mmol/L (ref 98–111)
Creatinine, Ser: 1.08 mg/dL — ABNORMAL HIGH (ref 0.44–1.00)
GFR, Estimated: >60 mL/min (ref >60)
Glucose, Bld: 78 mg/dL (ref 70–99)
Potassium: 3.7 mmol/L (ref 3.5–5.1)
Sodium: 138 mmol/L (ref 135–145)
Total Bilirubin: 0.1 mg/dL — ABNORMAL LOW (ref 0.3–1.2)
Total Protein: 6.9 g/dL (ref 6.5–8.1)

## 2023-02-20 LAB — RESP PANEL BY RT-PCR (RSV, FLU A&B, COVID)  RVPGX2
Influenza A by PCR: NEGATIVE
Influenza B by PCR: NEGATIVE
Resp Syncytial Virus by PCR: NEGATIVE
SARS Coronavirus 2 by RT PCR: NEGATIVE

## 2023-02-20 LAB — PREGNANCY, URINE: Preg Test, Ur: NEGATIVE

## 2023-02-20 LAB — LIPASE, BLOOD: Lipase: 25 U/L (ref 11–51)

## 2023-02-20 MED ORDER — IPRATROPIUM BROMIDE 0.02 % IN SOLN
0.5000 mg | Freq: Once | RESPIRATORY_TRACT | Status: AC
  Administered 2023-02-20: 0.5 mg via RESPIRATORY_TRACT
  Filled 2023-02-20: qty 2.5

## 2023-02-20 MED ORDER — IPRATROPIUM-ALBUTEROL 0.5-2.5 (3) MG/3ML IN SOLN
3.0000 mL | Freq: Once | RESPIRATORY_TRACT | Status: AC
  Administered 2023-02-20: 3 mL via RESPIRATORY_TRACT

## 2023-02-20 MED ORDER — IPRATROPIUM-ALBUTEROL 0.5-2.5 (3) MG/3ML IN SOLN
RESPIRATORY_TRACT | Status: AC
  Filled 2023-02-20: qty 3

## 2023-02-20 MED ORDER — IPRATROPIUM-ALBUTEROL 0.5-2.5 (3) MG/3ML IN SOLN
3.0000 mL | RESPIRATORY_TRACT | Status: AC
  Administered 2023-02-20: 3 mL via RESPIRATORY_TRACT
  Filled 2023-02-20: qty 3

## 2023-02-20 MED ORDER — METHYLPREDNISOLONE SODIUM SUCC 125 MG IJ SOLR
125.0000 mg | Freq: Once | INTRAMUSCULAR | Status: AC
  Administered 2023-02-20: 125 mg via INTRAVENOUS
  Filled 2023-02-20: qty 2

## 2023-02-20 MED ORDER — ALBUTEROL (5 MG/ML) CONTINUOUS INHALATION SOLN: 10.0000 mg/h | INHALATION_SOLUTION | Freq: Once | RESPIRATORY_TRACT | Status: DC

## 2023-02-20 MED ORDER — IPRATROPIUM-ALBUTEROL 0.5-2.5 (3) MG/3ML IN SOLN: 3.0000 mL | Freq: Once | RESPIRATORY_TRACT | Status: DC

## 2023-02-20 MED ORDER — SODIUM CHLORIDE 0.9 % IV BOLUS
1000.0000 mL | Freq: Once | INTRAVENOUS | Status: AC
  Administered 2023-02-20: 1000 mL via INTRAVENOUS

## 2023-02-20 MED ORDER — HYDROMORPHONE HCL 1 MG/ML IJ SOLN: 1.0000 mg | Freq: Once | INTRAMUSCULAR | Status: DC

## 2023-02-20 MED ORDER — ONDANSETRON HCL 4 MG/2ML IJ SOLN
4.0000 mg | Freq: Once | INTRAMUSCULAR | Status: AC
  Administered 2023-02-20: 4 mg via INTRAVENOUS
  Filled 2023-02-20: qty 2

## 2023-02-20 MED ORDER — IOHEXOL 300 MG/ML  SOLN
100.0000 mL | Freq: Once | INTRAMUSCULAR | Status: AC | PRN
  Administered 2023-02-20: 100 mL via INTRAVENOUS

## 2023-02-20 MED ORDER — MAGNESIUM SULFATE 2 GM/50ML IV SOLN: 2.0000 g | Freq: Once | INTRAVENOUS | Status: DC

## 2023-02-20 NOTE — ED Provider Notes (Signed)
[facility] Provider Note   CSN: [id number] Arrival date & time: 02/20/23  1636     History  Chief Complaint  Patient presents with   Back Pain   HPI Robin Frey is a 47 y.o. female with bipolar disorder, GERD, status post appendectomy and cholecystectomy presenting for back pain.  Started yesterday.  Located in the right mid back and radiates to the front of her abdomen and into her groin.  It feels sharp. Also endorses dysuria but no hematuria.  Denies any other abdominal pain.  States she has been nauseous and vomited once yesterday.  Also mention that she had some slurred speech yesterday from 2 to 3:00 in the afternoon.  Speech is normal at this time.   Back Pain      Home Medications Prior to Admission medications   Medication Sig Start Date End Date Taking? Authorizing Provider  albuterol (PROVENTIL HFA;VENTOLIN HFA) 108 (90 Base) MCG/ACT inhaler Inhale 1-2 puffs into the lungs every 6 (six) hours as needed for wheezing. 07/12/17   Rolland Porter, MD  Aspirin-Acetaminophen-Caffeine (GOODY HEADACHE PO) Take 2 packets by mouth 4 (four) times daily.    [provider]  azithromycin (ZITHROMAX) 250 MG tablet Take 1 tablet (250 mg total) by mouth daily. Take first 2 tablets together, then 1 every day until finished. 07/14/20   Dartha Lodge, PA-C  cyclobenzaprine (FLEXERIL) 10 MG tablet Take 1 tablet (10 mg total) by mouth 2 (two) times daily as needed for muscle spasms. 02/01/23   Curatolo, Adam, DO  dicyclomine (BENTYL) 20 MG tablet Take 1 tablet (20 mg total) by mouth 2 (two) times daily. 05/26/18   Aviva Kluver B, PA-C  fluticasone furoate-vilanterol (BREO ELLIPTA) 100-25 MCG/INH AEPB Inhale 1 puff into the lungs every evening.    [provider]  fluticasone furoate-vilanterol (BREO ELLIPTA) 100-25 MCG/INH AEPB Inhale into the lungs.    [provider]  furosemide (LASIX) 20 MG tablet Take 3 tablets (60  mg total) by mouth daily. 07/14/20 08/13/20  Dartha Lodge, PA-C  gabapentin (NEURONTIN) 300 MG capsule Take 600 mg by mouth 3 (three) times daily. 04/03/18   [provider]  gabapentin (NEURONTIN) 300 MG capsule Take by mouth.    [provider]  HYDROcodone-acetaminophen (NORCO/VICODIN) 5-325 MG tablet Take 1-2 tablets by mouth every 4 (four) hours as needed for severe pain. Patient not taking: Reported on 05/26/2018 04/30/18   Terrilee Files, MD  methylPREDNISolone (MEDROL DOSEPAK) 4 MG TBPK tablet Follow package insert 02/01/23   Curatolo, Adam, DO  omeprazole (PRILOSEC) 20 MG capsule Take 1 capsule (20 mg total) by mouth daily. 05/26/18   Aviva Kluver B, PA-C  ondansetron (ZOFRAN-ODT) 4 MG disintegrating tablet Take 1 tablet (4 mg total) by mouth every 8 (eight) hours as needed. 02/01/23   Curatolo, Adam, DO  sucralfate (CARAFATE) 1 g tablet Take 1 tablet (1 g total) by mouth 4 (four) times daily -  with meals and at bedtime for 10 days. 05/26/18 06/05/18  Aviva Kluver B, PA-C      Allergies    Cephalexin, Ciprofloxacin, Morphine and codeine, Trazodone and nefazodone, Ketorolac tromethamine, and Sulfamethoxazole-trimethoprim    Review of Systems   Review of Systems  Musculoskeletal:  Positive for back pain.    Physical Exam   Vitals:   02/20/23 2300 02/20/23 2315  BP: 109/70 111/73  Pulse: 76 74  Resp: 19 15  Temp:    SpO2:  94% 93%    CONSTITUTIONAL:  well-appearing, NAD, tired NEURO:  GCS 15. Speech is goal oriented. No deficits appreciated to CN III-XII; symmetric eyebrow raise, no facial drooping, tongue midline. Patient has equal grip strength bilaterally with 5/5 strength against resistance in all major muscle groups bilaterally. Sensation to light touch intact. Patient moves extremities without ataxia. Normal finger-nose-finger. Patient ambulatory with steady gait. EYES:  eyes equal and reactive ENT/NECK:  Supple, no stridor  CARDIO:  Regular rate and  rhythm, appears well-perfused  PULM:  No respiratory distress, wheezing. GI/GU:  non-distended, soft, right CVA tenderness MSK/SPINE:  No gross deformities, no edema, moves all extremities  SKIN:  no rash, atraumatic  *Additional and/or pertinent findings included in MDM below  ED Results / Procedures / Treatments   Labs (all labs ordered are listed, but only abnormal results are displayed) Labs Reviewed  COMPREHENSIVE METABOLIC PANEL - Abnormal; Notable for the following components:      Result Value   Creatinine, Ser 1.08 (*)    Calcium 8.7 (*)    AST 13 (*)    Total Bilirubin 0.1 (*)    All other components within normal limits  URINALYSIS, ROUTINE W REFLEX MICROSCOPIC - Abnormal; Notable for the following components:   Hgb urine dipstick MODERATE (*)    Protein, ur 30 (*)    All other components within normal limits  URINALYSIS, MICROSCOPIC (REFLEX) - Abnormal; Notable for the following components:   Bacteria, UA RARE (*)    All other components within normal limits  RESP PANEL BY RT-PCR (RSV, FLU A&B, COVID)  RVPGX2  CBC WITH DIFFERENTIAL/PLATELET  LIPASE, BLOOD  PREGNANCY, URINE    EKG None  Radiology CT Chest W Contrast  Result Date: 02/20/2023 CLINICAL DATA:  Pneumonia with complication suspected. X-ray done. Wheezing. Patchy airspace disease seen on chest radiograph. EXAM: CT CHEST WITH CONTRAST TECHNIQUE: Multidetector CT imaging of the chest was performed during intravenous contrast administration. RADIATION DOSE REDUCTION: This exam was performed according to the departmental dose-optimization program which includes automated exposure control, adjustment of the mA and/or kV according to patient size and/or use of iterative reconstruction technique. CONTRAST:  OMNIPAQUE IOHEXOL 300 MG/ML  SOLN COMPARISON:  Chest radiograph 02/20/2023 and 12/14/2022. CT chest 11/07/2019 FINDINGS: Cardiovascular: Normal heart size. No pericardial effusions. Normal caliber thoracic  aorta. Mediastinum/Nodes: Thyroid gland is unremarkable. Mediastinal lymph nodes are present without pathologic enlargement, likely reactive. Esophagus is decompressed. Lungs/Pleura: Mosaic attenuation pattern to the lungs with patchy airspace changes bilaterally. Appearances are similar to previous CT. This may represent edema, pneumonia, or chronic inflammatory process. No pleural effusions. No pneumothorax. Upper Abdomen: Surgical absence of the gallbladder. No acute abnormalities. Musculoskeletal: Degenerative changes in the spine. No destructive bone lesions. Old ununited fracture of the sternum. IMPRESSION: 1. Mosaic attenuation pattern to the lungs with patchy airspace changes bilaterally. Appearances are similar to previous CT, possibly representing edema, pneumonia, or chronic inflammatory process. 2. Mediastinal lymphadenopathy is similar to prior study, likely reactive. Electronically Signed   By: Burman Nieves M.D.   On: 02/20/2023 21:48   DG Chest Port 1 View  Result Date: 02/20/2023 CLINICAL DATA:  shob EXAM: PORTABLE CHEST 1 VIEW COMPARISON:  Chest x-ray 12/14/2022, CT chest 11/07/2019 FINDINGS: The heart and mediastinal contours are unchanged. Persistent prominent hilar vasculature. Persistent vague patchy airspace opacities. No pulmonary edema. No pleural effusion. No pneumothorax. No acute osseous abnormality. IMPRESSION: Persistent vague patchy airspace opacities. Recommend CT chest with intravenous contrast for further evaluation. Electronically  Signed   By: Tish Frederickson M.D.   On: 02/20/2023 20:18   CT RENAL STONE STUDY  Result Date: 02/20/2023 CLINICAL DATA:  Abdominal pain, right flank pain EXAM: CT ABDOMEN AND PELVIS WITHOUT CONTRAST TECHNIQUE: Multidetector CT imaging of the abdomen and pelvis was performed following the standard protocol without IV contrast. RADIATION DOSE REDUCTION: This exam was performed according to the departmental dose-optimization program which includes  automated exposure control, adjustment of the mA and/or kV according to patient size and/or use of iterative reconstruction technique. COMPARISON:  02/01/2023 FINDINGS: Lower chest: There are patchy ground-glass densities in both lower lung fields suggesting scarring or interstitial pneumonia. There is no pleural effusion. Hepatobiliary: Surgical clips are seen in gallbladder fossa. There is no dilation of bile ducts. Pancreas: No focal abnormalities are seen. Spleen: Unremarkable. Adrenals/Urinary Tract: Adrenals are unremarkable. There is no hydronephrosis. There is tiny punctate calculus in the lower pole of right kidney. Ureters are not dilated. Urinary bladder is not distended. Stomach/Bowel: Stomach is unremarkable. Small bowel loops are not dilated. The appendix is not seen. There are surgical clips in the pericecal region. There is no significant wall thickening in colon. There is no pericolic stranding or fluid collection. Vascular/Lymphatic: Scattered arterial calcifications are seen in the aorta and its major branches. Reproductive: Minimal lobulations are seen in the margin of the uterus. There are no adnexal masses. Other: There is no ascites or pneumoperitoneum. A tiny umbilical hernia containing fat. There are possible tiny inguinal hernias containing fat. Musculoskeletal: There is minimal anterolisthesis at the L4-L5 level. There is encroachment of neural foramina at L4-L5 and L5-S1 levels. IMPRESSION: There is no evidence of intestinal obstruction or pneumoperitoneum. There is no hydronephrosis. There is tiny punctate calculus in the lower pole of right kidney. Aortic arteriosclerosis. Lumbar spondylosis. There are patchy faint ground-glass densities in both lower lung fields suggesting scarring or interstitial pneumonia. Other findings as described in the body of the report. Electronically Signed   By: Ernie Avena M.D.   On: 02/20/2023 20:05    Procedures .Critical Care  Performed by:  Gareth Eagle, PA-C Authorized by: Gareth Eagle, PA-C   Critical care provider statement:    Critical care time (minutes):  30   Critical care was necessary to treat or prevent imminent or life-threatening deterioration of the following conditions:  Respiratory failure (Asthma exacerbation with hypoxia)   Critical care was time spent personally by me on the following activities:  Development of treatment plan with patient or surrogate, discussions with consultants, evaluation of patient's response to treatment, examination of patient, ordering and review of laboratory studies, ordering and review of radiographic studies, ordering and performing treatments and interventions, pulse oximetry, re-evaluation of patient's condition and review of old charts     Medications Ordered in ED Medications  ondansetron (ZOFRAN) injection 4 mg (4 mg Intravenous Given 02/20/23 2020)  sodium chloride 0.9 % bolus 1,000 mL (0 mLs Intravenous Stopped 02/20/23 2310)  methylPREDNISolone sodium succinate (SOLU-MEDROL) 125 mg/2 mL injection 125 mg (125 mg Intravenous Given 02/20/23 2020)  ipratropium (ATROVENT) nebulizer solution 0.5 mg (0.5 mg Nebulization Given by Other 02/20/23 2022)  ipratropium-albuterol (DUONEB) 0.5-2.5 (3) MG/3ML nebulizer solution 3 mL (3 mLs Nebulization Given by Other 02/20/23 2022)  ipratropium-albuterol (DUONEB) 0.5-2.5 (3) MG/3ML nebulizer solution 3 mL (3 mLs Nebulization Given 02/20/23 2030)  ipratropium-albuterol (DUONEB) 0.5-2.5 (3) MG/3ML nebulizer solution 3 mL (3 mLs Nebulization Given 02/20/23 2043)  iohexol (OMNIPAQUE) 300 MG/ML solution 100 mL (100 mLs  Intravenous Contrast Given 02/20/23 2120)    ED Course/ Medical Decision Making/ A&P                             Medical Decision Making Amount and/or Complexity of Data Reviewed Labs: ordered. Radiology: ordered.  Risk Prescription drug management.   Initial Impression and Ddx 47 year old well-appearing female  presenting for back pain.  Exam was notable for CVA tenderness and wheezing.  DDx includes kidney stone, UTI, pneumonia, asthma or COPD exacerbation.  Patient PMH that increases complexity of ED encounter:  bipolar disorder, GERD, status post appendectomy and cholecystectomy   Interpretation of Diagnostics I independent reviewed and interpreted the labs as followed: hematuria  - I independently visualized the following imaging with scope of interpretation limited to determining acute life threatening conditions related to emergency care: CT renal stone study, which revealed tiny stone in right kidney, CT chest revealed mosaic attenuation pattern to the lungs with patchy airspace opacities and mediastinal lymphadenopathy but appears similar to prior CT study  Patient Reassessment and Ultimate Disposition/Management On serial assessments patient remained tired and somnolent.  Patient states she did take a lorazepam and Neurontin today could be polypharmacy.  Patient fell asleep multiple times and was hypoxic into the 80s.  After breathing treatment, lungs sound much better overall.  However given her persistence of her intermittent hypoxia, thought it warranted admission to the hospital.  Unfortunately patient refused service and decided to leave AMA.  Patient management required discussion with the following services or consulting groups:  None  Complexity of Problems Addressed Acute complicated illness or Injury  Additional Data Reviewed and Analyzed Further history obtained from: Past medical history and medications listed in the EMR, Prior ED visit notes, and Recent discharge summary  Patient Encounter Risk Assessment None         Final Clinical Impression(s) / ED Diagnoses Final diagnoses:  Hypoxia  Exacerbation of asthma, unspecified asthma severity, unspecified whether persistent    Rx / DC Orders ED Discharge Orders     None         Gareth Eagle, PA-C 02/20/23  2335    Benjiman Core, MD 02/20/23 2340

## 2023-02-20 NOTE — ED Notes (Signed)
Placed patient on 4L [place] due to Hx of sleep apnea and de-saturation. Patient is very sleepy. Patient saturation increased to 94%.

## 2023-02-20 NOTE — ED Triage Notes (Addendum)
Patient presents to ED via POV from home. Here with lower back pain. Attempted to be seen at [facility] ED yesterday for same but left due to long wait times. Denies injury or trauma. Reports slurred speech since yesterday around 1400-1500. Mild slurring noted. Ambulatory with steady gait. No other neuro deficits.

## 2023-02-20 NOTE — ED Notes (Signed)
Pt does not want to stay, pt willing to sign out AMA. Dtg has been called to picker her up.

## 2023-02-20 NOTE — ED Notes (Signed)
Lung sounds are coarse and wheezes anterior bilat.
